# Patient Record
Sex: Female | Born: 1945 | Race: Black or African American | Hispanic: No | Smoking: Never smoker
Health system: Southern US, Community
[De-identification: ages and names within clinical notes are randomized; demographics above are authoritative.]

## PROBLEM LIST (undated history)

## (undated) DIAGNOSIS — Z8619 Personal history of other infectious and parasitic diseases: Secondary | ICD-10-CM

## (undated) DIAGNOSIS — D649 Anemia, unspecified: Secondary | ICD-10-CM

## (undated) DIAGNOSIS — N6019 Diffuse cystic mastopathy of unspecified breast: Secondary | ICD-10-CM

## (undated) DIAGNOSIS — I10 Essential (primary) hypertension: Secondary | ICD-10-CM

## (undated) DIAGNOSIS — I639 Cerebral infarction, unspecified: Secondary | ICD-10-CM

## (undated) DIAGNOSIS — B379 Candidiasis, unspecified: Secondary | ICD-10-CM

## (undated) DIAGNOSIS — A599 Trichomoniasis, unspecified: Secondary | ICD-10-CM

## (undated) HISTORY — DX: Candidiasis, unspecified: B37.9

## (undated) HISTORY — PX: OTHER SURGICAL HISTORY: SHX169

## (undated) HISTORY — DX: Trichomoniasis, unspecified: A59.9

## (undated) HISTORY — DX: Cerebral infarction, unspecified: I63.9

## (undated) HISTORY — DX: Anemia, unspecified: D64.9

## (undated) HISTORY — PX: DILATION AND CURETTAGE OF UTERUS: SHX78

## (undated) HISTORY — DX: Essential (primary) hypertension: I10

## (undated) HISTORY — DX: Personal history of other infectious and parasitic diseases: Z86.19

## (undated) HISTORY — DX: Diffuse cystic mastopathy of unspecified breast: N60.19

---

## 1998-01-15 ENCOUNTER — Encounter: Payer: Self-pay | Admitting: Emergency Medicine

## 1998-01-16 ENCOUNTER — Inpatient Hospital Stay (HOSPITAL_COMMUNITY): Admission: EM | Admit: 1998-01-16 | Discharge: 1998-01-18 | Payer: Self-pay | Admitting: Emergency Medicine

## 1998-01-16 ENCOUNTER — Encounter: Payer: Self-pay | Admitting: Emergency Medicine

## 1998-04-23 ENCOUNTER — Other Ambulatory Visit: Admission: RE | Admit: 1998-04-23 | Discharge: 1998-04-23 | Payer: Self-pay | Admitting: Obstetrics and Gynecology

## 1999-06-19 ENCOUNTER — Other Ambulatory Visit: Admission: RE | Admit: 1999-06-19 | Discharge: 1999-06-19 | Payer: Self-pay | Admitting: Obstetrics and Gynecology

## 1999-12-04 ENCOUNTER — Encounter: Admission: RE | Admit: 1999-12-04 | Discharge: 1999-12-04 | Payer: Self-pay | Admitting: Cardiology

## 1999-12-04 ENCOUNTER — Encounter: Payer: Self-pay | Admitting: Cardiology

## 1999-12-26 ENCOUNTER — Ambulatory Visit (HOSPITAL_COMMUNITY): Admission: RE | Admit: 1999-12-26 | Discharge: 1999-12-26 | Payer: Self-pay | Admitting: *Deleted

## 2001-07-04 ENCOUNTER — Encounter: Payer: Self-pay | Admitting: Cardiology

## 2001-07-04 ENCOUNTER — Encounter: Admission: RE | Admit: 2001-07-04 | Discharge: 2001-07-04 | Payer: Self-pay | Admitting: Cardiology

## 2001-08-18 ENCOUNTER — Encounter: Admission: RE | Admit: 2001-08-18 | Discharge: 2001-08-18 | Payer: Self-pay | Admitting: *Deleted

## 2001-08-18 ENCOUNTER — Encounter: Payer: Self-pay | Admitting: *Deleted

## 2001-11-08 ENCOUNTER — Other Ambulatory Visit: Admission: RE | Admit: 2001-11-08 | Discharge: 2001-11-08 | Payer: Self-pay | Admitting: Obstetrics and Gynecology

## 2002-02-28 ENCOUNTER — Encounter: Payer: Self-pay | Admitting: Orthopedic Surgery

## 2002-02-28 ENCOUNTER — Ambulatory Visit (HOSPITAL_COMMUNITY): Admission: RE | Admit: 2002-02-28 | Discharge: 2002-02-28 | Payer: Self-pay | Admitting: Orthopedic Surgery

## 2002-09-20 ENCOUNTER — Encounter: Payer: Self-pay | Admitting: Emergency Medicine

## 2002-09-20 ENCOUNTER — Observation Stay (HOSPITAL_COMMUNITY): Admission: EM | Admit: 2002-09-20 | Discharge: 2002-09-21 | Payer: Self-pay | Admitting: Emergency Medicine

## 2002-09-21 ENCOUNTER — Encounter: Payer: Self-pay | Admitting: Cardiology

## 2002-12-07 ENCOUNTER — Other Ambulatory Visit: Admission: RE | Admit: 2002-12-07 | Discharge: 2002-12-07 | Payer: Self-pay | Admitting: Radiology

## 2003-01-17 ENCOUNTER — Ambulatory Visit (HOSPITAL_COMMUNITY): Admission: RE | Admit: 2003-01-17 | Discharge: 2003-01-17 | Payer: Self-pay | Admitting: Orthopedic Surgery

## 2003-11-05 ENCOUNTER — Encounter: Admission: RE | Admit: 2003-11-05 | Discharge: 2003-11-05 | Payer: Self-pay | Admitting: Cardiology

## 2003-11-28 ENCOUNTER — Other Ambulatory Visit: Admission: RE | Admit: 2003-11-28 | Discharge: 2003-11-28 | Payer: Self-pay | Admitting: Obstetrics and Gynecology

## 2004-03-25 ENCOUNTER — Encounter: Admission: RE | Admit: 2004-03-25 | Discharge: 2004-03-25 | Payer: Self-pay | Admitting: Cardiology

## 2004-03-27 ENCOUNTER — Encounter: Admission: RE | Admit: 2004-03-27 | Discharge: 2004-03-27 | Payer: Self-pay | Admitting: Cardiology

## 2004-09-24 ENCOUNTER — Encounter: Admission: RE | Admit: 2004-09-24 | Discharge: 2004-09-24 | Payer: Self-pay | Admitting: Cardiology

## 2004-12-03 ENCOUNTER — Other Ambulatory Visit: Admission: RE | Admit: 2004-12-03 | Discharge: 2004-12-03 | Payer: Self-pay | Admitting: Obstetrics and Gynecology

## 2004-12-12 ENCOUNTER — Encounter (INDEPENDENT_AMBULATORY_CARE_PROVIDER_SITE_OTHER): Payer: Self-pay | Admitting: *Deleted

## 2004-12-12 ENCOUNTER — Ambulatory Visit (HOSPITAL_COMMUNITY): Admission: RE | Admit: 2004-12-12 | Discharge: 2004-12-12 | Payer: Self-pay | Admitting: Gastroenterology

## 2005-05-06 ENCOUNTER — Inpatient Hospital Stay (HOSPITAL_COMMUNITY): Admission: EM | Admit: 2005-05-06 | Discharge: 2005-05-08 | Payer: Self-pay | Admitting: *Deleted

## 2005-05-06 ENCOUNTER — Ambulatory Visit: Payer: Self-pay | Admitting: Cardiology

## 2005-08-05 ENCOUNTER — Emergency Department (HOSPITAL_COMMUNITY): Admission: EM | Admit: 2005-08-05 | Discharge: 2005-08-06 | Payer: Self-pay | Admitting: Emergency Medicine

## 2005-08-25 ENCOUNTER — Encounter: Admission: RE | Admit: 2005-08-25 | Discharge: 2005-08-25 | Payer: Self-pay | Admitting: Orthopedic Surgery

## 2006-02-26 ENCOUNTER — Other Ambulatory Visit: Admission: RE | Admit: 2006-02-26 | Discharge: 2006-02-26 | Payer: Self-pay | Admitting: Obstetrics and Gynecology

## 2006-09-20 ENCOUNTER — Encounter: Admission: RE | Admit: 2006-09-20 | Discharge: 2006-09-20 | Payer: Self-pay | Admitting: Cardiology

## 2007-04-12 ENCOUNTER — Other Ambulatory Visit: Admission: RE | Admit: 2007-04-12 | Discharge: 2007-04-12 | Payer: Self-pay | Admitting: Obstetrics and Gynecology

## 2008-01-18 ENCOUNTER — Encounter: Admission: RE | Admit: 2008-01-18 | Discharge: 2008-01-18 | Payer: Self-pay | Admitting: Orthopedic Surgery

## 2008-03-08 ENCOUNTER — Inpatient Hospital Stay (HOSPITAL_COMMUNITY): Admission: EM | Admit: 2008-03-08 | Discharge: 2008-03-10 | Payer: Self-pay | Admitting: Emergency Medicine

## 2008-05-17 ENCOUNTER — Encounter: Admission: RE | Admit: 2008-05-17 | Discharge: 2008-05-17 | Payer: Self-pay | Admitting: Orthopedic Surgery

## 2008-07-19 ENCOUNTER — Encounter: Admission: RE | Admit: 2008-07-19 | Discharge: 2008-10-17 | Payer: Self-pay | Admitting: Orthopedic Surgery

## 2008-12-13 ENCOUNTER — Encounter (INDEPENDENT_AMBULATORY_CARE_PROVIDER_SITE_OTHER): Payer: Self-pay | Admitting: Cardiology

## 2008-12-13 ENCOUNTER — Ambulatory Visit (HOSPITAL_COMMUNITY): Admission: RE | Admit: 2008-12-13 | Discharge: 2008-12-13 | Payer: Self-pay | Admitting: Cardiology

## 2009-03-16 ENCOUNTER — Inpatient Hospital Stay (HOSPITAL_COMMUNITY): Admission: EM | Admit: 2009-03-16 | Discharge: 2009-03-18 | Payer: Self-pay | Admitting: Emergency Medicine

## 2009-04-04 ENCOUNTER — Encounter: Admission: RE | Admit: 2009-04-04 | Discharge: 2009-05-28 | Payer: Self-pay | Admitting: Orthopedic Surgery

## 2009-04-18 ENCOUNTER — Encounter: Admission: RE | Admit: 2009-04-18 | Discharge: 2009-04-18 | Payer: Self-pay | Admitting: Orthopedic Surgery

## 2009-05-02 ENCOUNTER — Encounter: Admission: RE | Admit: 2009-05-02 | Discharge: 2009-05-02 | Payer: Self-pay | Admitting: Orthopedic Surgery

## 2010-01-09 ENCOUNTER — Encounter
Admission: RE | Admit: 2010-01-09 | Discharge: 2010-01-09 | Payer: Self-pay | Source: Home / Self Care | Attending: Neurology | Admitting: Neurology

## 2010-02-17 ENCOUNTER — Encounter: Payer: Self-pay | Admitting: Cardiology

## 2010-03-14 ENCOUNTER — Other Ambulatory Visit (HOSPITAL_COMMUNITY): Payer: Self-pay | Admitting: Neurology

## 2010-03-14 DIAGNOSIS — I729 Aneurysm of unspecified site: Secondary | ICD-10-CM

## 2010-03-20 ENCOUNTER — Ambulatory Visit (HOSPITAL_COMMUNITY)
Admission: RE | Admit: 2010-03-20 | Discharge: 2010-03-20 | Disposition: A | Discharge status: Home / Self Care | Payer: BC Managed Care – PPO | Source: Ambulatory Visit | Attending: Neurology | Admitting: Neurology

## 2010-03-20 DIAGNOSIS — I729 Aneurysm of unspecified site: Secondary | ICD-10-CM

## 2010-04-04 ENCOUNTER — Inpatient Hospital Stay (INDEPENDENT_AMBULATORY_CARE_PROVIDER_SITE_OTHER)
Admission: RE | Admit: 2010-04-04 | Discharge: 2010-04-04 | Disposition: A | Discharge status: Home / Self Care | Payer: BC Managed Care – PPO | Source: Ambulatory Visit | Attending: Family Medicine | Admitting: Family Medicine

## 2010-04-04 DIAGNOSIS — M799 Soft tissue disorder, unspecified: Secondary | ICD-10-CM

## 2010-04-07 ENCOUNTER — Other Ambulatory Visit (HOSPITAL_COMMUNITY)
Admission: RE | Admit: 2010-04-07 | Discharge: 2010-04-07 | Disposition: A | Discharge status: Home / Self Care | Payer: BC Managed Care – PPO | Source: Ambulatory Visit | Attending: Obstetrics and Gynecology | Admitting: Obstetrics and Gynecology

## 2010-04-07 ENCOUNTER — Other Ambulatory Visit: Payer: Self-pay | Admitting: Obstetrics and Gynecology

## 2010-04-07 DIAGNOSIS — Z01419 Encounter for gynecological examination (general) (routine) without abnormal findings: Secondary | ICD-10-CM | POA: Insufficient documentation

## 2010-04-15 ENCOUNTER — Other Ambulatory Visit: Payer: Self-pay | Admitting: Cardiology

## 2010-04-15 DIAGNOSIS — M545 Low back pain: Secondary | ICD-10-CM

## 2010-04-17 ENCOUNTER — Ambulatory Visit
Admission: RE | Admit: 2010-04-17 | Discharge: 2010-04-17 | Disposition: A | Discharge status: Home / Self Care | Payer: BC Managed Care – PPO | Source: Ambulatory Visit | Attending: Cardiology | Admitting: Cardiology

## 2010-04-17 DIAGNOSIS — M545 Low back pain: Secondary | ICD-10-CM

## 2010-04-17 LAB — DIFFERENTIAL
Basophils Absolute: 0 10*3/uL (ref 0.0–0.1)
Eosinophils Relative: 1 % (ref 0–5)
Lymphocytes Relative: 20 % (ref 12–46)
Lymphs Abs: 1.5 10*3/uL (ref 0.7–4.0)
Monocytes Absolute: 0.6 10*3/uL (ref 0.1–1.0)

## 2010-04-17 LAB — COMPREHENSIVE METABOLIC PANEL
AST: 27 U/L (ref 0–37)
Albumin: 3.6 g/dL (ref 3.5–5.2)
Chloride: 110 mEq/L (ref 96–112)
Creatinine, Ser: 0.69 mg/dL (ref 0.4–1.2)
GFR calc Af Amer: >60 mL/min (ref >60)
Sodium: 140 mEq/L (ref 135–145)
Total Bilirubin: 0.4 mg/dL (ref 0.3–1.2)

## 2010-04-17 LAB — URINALYSIS, ROUTINE W REFLEX MICROSCOPIC
Nitrite: NEGATIVE
Protein, ur: NEGATIVE mg/dL
Urobilinogen, UA: 0.2 mg/dL (ref 0.0–1.0)

## 2010-04-17 LAB — CBC
MCV: 95.2 fL (ref 78.0–100.0)
Platelets: 200 10*3/uL (ref 150–400)
WBC: 7.3 10*3/uL (ref 4.0–10.5)

## 2010-05-13 LAB — BASIC METABOLIC PANEL
BUN: 14 mg/dL (ref 6–23)
CO2: 26 mEq/L (ref 19–32)
Chloride: 112 mEq/L (ref 96–112)
Creatinine, Ser: 0.76 mg/dL (ref 0.4–1.2)

## 2010-05-13 LAB — PROTIME-INR
INR: 1 (ref 0.00–1.49)
Prothrombin Time: 13.6 seconds (ref 11.6–15.2)

## 2010-05-13 LAB — CBC
MCHC: 34.4 g/dL (ref 30.0–36.0)
MCV: 93.1 fL (ref 78.0–100.0)
Platelets: 216 10*3/uL (ref 150–400)
RDW: 14.3 % (ref 11.5–15.5)

## 2010-05-13 LAB — DIFFERENTIAL
Basophils Relative: 1 % (ref 0–1)
Eosinophils Absolute: 0 10*3/uL (ref 0.0–0.7)
Neutrophils Relative %: 55 % (ref 43–77)

## 2010-06-13 NOTE — Discharge Summary (Signed)
[name], [name]       ACCOUNT NO.:  [id number]   MEDICAL RECORD NO.:  [id number]          PATIENT TYPE:  INP   LOCATION:  3036                         FACILITY:  [facility]   PHYSICIAN:  Pramod P. Pearlean Brownie, MD    DATE OF BIRTH:  [birth year]   DATE OF ADMISSION:  05/06/2005  DATE OF DISCHARGE:  05/08/2005                                 DISCHARGE SUMMARY   DIAGNOSES AT TIME OF DISCHARGE:  1.  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  2.  Dyslipidemia.  3.  Hypertension.  4.  Fibroma resection of the right breast.   MEDICINES AT TIME OF DISCHARGE:  1.  Lipitor 20 mg a day.  2.  Lopressor 50 mg a day.  3.  Aggrenox one b.i.d.  4.  Tylenol two tablets 30 minutes to one hour prior to Aggrenox dose times      one week only.  5.  Vitamin E 800 units a day.  6.  B50 one a day.  7.  Iron one a day.  8.  Xanax 0.5 one a day.   STUDIES PERFORMED:  1.  CT of the brain on admission   DICTATION ENDED AT THIS POINT      Annie Main, N.P.    ______________________________  Sunny Schlein. Pearlean Brownie, MD    SB/MEDQ  D:  05/08/2005  T:  05/08/2005  Job:  [id number]   cc:   Pramod P. Pearlean Brownie, MD  Fax: [phone]   Osvaldo Shipper. Spruill, M.D.  Fax: [phone]

## 2010-06-13 NOTE — H&P (Signed)
NAME:  [name], [name]                 ACCOUNT NO.:  [id number]   MEDICAL RECORD NO.:  [id number]                   PATIENT TYPE:  OIB   LOCATION:  2899                                 FACILITY:  [facility]   PHYSICIAN:  Myrtie Neither, M.D.                 DATE OF BIRTH:  [birth year]   DATE OF ADMISSION:  01/17/2003  DATE OF DISCHARGE:  01/17/2003                                HISTORY & PHYSICAL   PREOPERATIVE DIAGNOSIS:  Spur of left fifth toe with dorsal callosity.   POSTOPERATIVE DIAGNOSIS:  Spur of left fifth toe with dorsal callosity.   PROCEDURE:  Phalangectomy, left fifth toe.   SURGEON:  Myrtie Neither, M.D.   ANESTHESIA:  General.   DESCRIPTION OF PROCEDURE:  The patient was taken to the operating room after  giving adequate preop medication and given general anesthesia.  The left  foot was prepped with Duraprep and draped in a sterile manner.  A tourniquet  was used for hemostasis.  A midline incision was made over the dorsal aspect  of the left 5th toe, going through the skin and subcutaneous tissue.  Extensor tendon was separated.  Osteophyte was identified along the proximal  phalanx.  A phalangectomy was then done.  Copious irrigation was done,  followed by wound closure with the use of 4-0 nylon.  Five milliliters of  0.25% Marcaine were injected into the area.  A compressive dressing was  applied and the patient was fit for a fracture shoe.  The patient tolerated  the procedure quite well and went to the recovery room in stable and  satisfactory condition.   The patient is being discharged home on Percocet one to two q.4 h. p.r.n.  for pain, ice packs, elevation.  Return to the office in one week.  The  patient is being discharged in stable and satisfactory condition.                                                Myrtie Neither, M.D.    AC/MEDQ  D:  01/17/2003  T:  01/18/2003  Job:  [id number]
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
## 2010-06-13 NOTE — Op Note (Signed)
[name], [name]       ACCOUNT NO.:  [id number]   MEDICAL RECORD NO.:  [id number]          PATIENT TYPE:  AMB   LOCATION:  ENDO                         FACILITY:  [facility]   PHYSICIAN:  Anselmo Rod, M.D.  DATE OF BIRTH:  Jul 03, 1945   DATE OF PROCEDURE:  12/12/2004  DATE OF DISCHARGE:                                 OPERATIVE REPORT   PROCEDURE PERFORMED:  Colonoscopy with multiple cold biopsies.   ENDOSCOPIST:  Anselmo Rod, M.D.   INSTRUMENT USED:  Olympus video colonoscope.   INDICATION FOR PROCEDURE:  A 65 year old African-American female with a  personal history of colonic polyps undergoing a surveillance colonoscopy to  rule out recurrent polyps.   PREPROCEDURE PREPARATION:  Informed consent was procured from the patient.  The patient was fasted for four hours prior to the procedure and prepped  with OsmoPrep the night or in the morning of the procedure.  The risks and  benefits of the procedure, including a 10% miss rate of cancer and polyps,  were discussed with the patient as well.   PREPROCEDURE PHYSICAL:  VITAL SIGNS:  The patient had stable vital signs.  NECK:  Supple.  CHEST:  Clear to auscultation.  S1, S2 regular.  ABDOMEN:  Soft with normal bowel sounds.   DESCRIPTION OF PROCEDURE:  The patient was placed in the left lateral  decubitus position and sedated with 75 mg of Demerol and 5 mg of Versed in  slow incremental doses.  Once the patient was adequately sedate and  maintained on low-flow oxygen and continuous cardiac monitoring, the Olympus  video colonoscope was advanced from the rectum to the cecum.  The  appendiceal orifice and the ileocecal valve were visualized and  photographed.  Four small sessile polyps were biopsied from the rectosigmoid  colon.  These were removed by cold biopsies.  Small internal and external  hemorrhoids were seen on retroflexion and anal inspection, respectively.  The rest of the exam was unremarkable.  The  proximal left colon, transverse  colon, cecum and ascending colon appeared normal.  There was some residual  stool in the colon.  Multiple washes were dyspnea on exertion.   IMPRESSION:  1.  Four small sessile polyps removed by cold biopsy from the rectosigmoid      colon.  2.  Small internal and external hemorrhoids.  3.  Otherwise unrevealing colonoscopy.   RECOMMENDATIONS:  1.  Await pathology results.  2.  Avoid all nonsteroidals including aspirin for the next two weeks.  3.  Repeat colonoscopy depending on pathology results.  4.  Outpatient follow-up as need arises in the future.      Anselmo Rod, M.D.  Electronically Signed     JNM/MEDQ  D:  12/12/2004  T:  12/13/2004  Job:  [id number]   cc:   Osvaldo Shipper. Spruill, M.D.  Fax: [phone]   Artist Pais, M.D.  Fax: [phone]

## 2010-06-13 NOTE — Consult Note (Signed)
NAMEMarland Kitchen  ROYALTY, FAKHOURI NO.:  [id number]   MEDICAL RECORD NO.:  [id number]          PATIENT TYPE:  EMS   LOCATION:  MAJO                         FACILITY:  [facility]   PHYSICIAN:  Marlan Palau, M.D.  DATE OF BIRTH:  12-18-1945   DATE OF CONSULTATION:  05/06/2005  DATE OF DISCHARGE:                                   CONSULTATION   NEUROLOGY CONSULT NOTE   HISTORY:  [name] is a 65 year old right-handed black female  born July 13, 1945 with a history of problems with hypertension.  The  patient is on a baby aspirin a day prior to this admission.  Three days  prior to this evaluation, the patient was taking a shower and then all of a  sudden got crazy thoughts in her head.  The patient was telling herself to  walk outside naked, but resisted that attempt.  The patient got over this in  several minutes.  The patient, 2 days prior to admission, felt as if she  were in a fog while at work.  Today, the patient was noted to have some  problems retaining fluid in her mouth while brushing her teeth and later on  noticed that her speech was a little bit slurred.  This has gradually  improved over time.  The patient reported no numbness or weakness in the  face, arms or legs.  No headache, visual field changes, double vision or  loss of vision.  The patient also noted while typing she felt slow.  The  patient came to the emergency room for further evaluation.  CT scan of the  head was unremarkable.   PAST MEDICAL HISTORY:  Significant for:  1.  History of new onset speech disturbance.  2.  Hypertension.  3.  Fibroma resection of the right breast.  4.  Hypercholesterolemia.   MEDICATIONS:  1.  Lopressor 50 mg daily.  2.  Xanax 0.5 mg if needed.  3.  Lipitor 10 mg daily.  4.  Aspirin 81 mg daily.  5.  Vitamin E supplementation.   ALLERGIES:  THE PATIENT HAS AN ALLERGY TO FLAGYL.   SOCIAL HISTORY:  Does not smoke or drink.  This patient is married, [place]  [place], has 2 children who are alive and well.  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   FAMILY MEDICAL HISTORY:  Notable that mother died with cancer and father  died with multiple myeloma.  The patient has no brothers and three sisters.  Sisters have borderline diabetes and hypertension.   REVIEW OF SYSTEMS:  Notable for no recent fevers or chills.  The patient  denies headache, visual field disturbance, swallowing problems or neck pain.  The patient does report some right shoulder pain.  Denies shortness of  breath and chest pain, but reports some occasional palpitations.  Denies any  abdominal pain, nausea or vomiting.  Had in urinary frequency and has been  given Detrol LA, but has not yet started this medication.  The patient has  no problems with balance or gait, numbness and  weakness on the arms or legs.   PHYSICAL EXAMINATION:  VITAL SIGNS:  Blood pressure is 150/82, heart rate is  82 and regular, respiratory rate 20 and temperature afebrile.  GENERAL:  This patient is a minimally obese black female who is alert and  cooperative that the time of this examination.  HEENT:  Head is atraumatic.  Eyes:  Pupils equal, round and reactive to  light.  Discs are flat bilaterally.  NECK:  Supple with no carotid bruits noted.  RESPIRATORY EXAMINATION:  Clear.  CARDIOVASCULAR EXAMINATION:  Regular rate and rhythm with no obvious murmurs  or rubs noted.  EXTREMITIES:  Without significant edema.  NEUROLOGIC EXAMINATION:  Cranial nerves as above.  Facial symmetry is  present.  The patient has good sensation of the face to pin prick and soft  touch bilaterally, has good strength to the facial muscles, and the muscles  to head turning and shoulder shrugging bilaterally. Speech is well  enunciated and not aphasic .  Motor testing reveals 5/5 strength in all 4  extremities.  Good symmetric motor tone is noted  throughout.  Sensory  testing is intact to pin prick, soft touch andvibration throughout.  The  patient has good finger-nose-finger and heel-to-shin, and no drift is seen.  Gait was not tested.  Deep tendon reflexes are symmetric with normal toes  downgoing bilaterally.   CT of the head is normal.   LABORATORY VALUES:  So far, none.   IMPRESSION:  1.  History of slurred speech and drooling today that is resolving.  2.  Rule out transient ischemic attack event.   This patient really has very minimal findings at this time.  We will set  patient for an MRI and MRA angiogram through the emergency room, and  continue aspirin for now.  If the above studies look unremarkable, we will  plan to discharge patient to home with follow up with [facility].  If a stroke-like event is noted, we may consider admission.      Marlan Palau, M.D.  Electronically Signed     CKW/MEDQ  D:  05/06/2005  T:  05/06/2005  Job:  [id number]   cc:   Osvaldo Shipper. Spruill, M.D.  Fax: [phone]   C. Lesia Sago, M.D.  Fax: [phone]

## 2010-06-13 NOTE — Discharge Summary (Signed)
[name], [name]       ACCOUNT NO.:  [id number]   MEDICAL RECORD NO.:  [id number]          PATIENT TYPE:  INP   LOCATION:  3036                         FACILITY:  [facility]   PHYSICIAN:  Pramod P. Pearlean Brownie, MD    DATE OF BIRTH:  1945-11-06   DATE OF ADMISSION:  05/06/2005  DATE OF DISCHARGE:  05/08/2005                                 DISCHARGE SUMMARY   DIAGNOSES AT DISCHARGE:  1.  Left corticol infarct secondary to small-vessel disease.  2.  Incidental bilateral cerebral aneurysms.  3.  Dyslipidemia.  4.  Hypertension.  5.  Fibroma resection of the right breast.   MEDICINES AT THE TIME OF DISCHARGE:  1.  Lipitor 20 mg [name] day.  2.  Lopressor 50 mg [name] day.  3.  Aggrenox one b.i.d.  4.  Tylenol two tablets 30 minutes to 1 hour prior to Aggrenox dose.  5.  Vitamin E 800 units [name] day.  6.  B50 one [name] day.  7.  Iron one [name] day.  8.  Xanax 0.5 mg [name] day.   STUDIES PERFORMED:  1.  CT of the brain on admission shows mild cerebral atrophy, no acute      findings.  2.  MRI of the brain shows acute/subacute infarct left posterior putamen.      Slightly greater than expected number of periventricular and subcortical      T2 hyperintensities.  These are nonspecific.  3.  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  4.  Chest x-ray shows no active cardiopulmonary findings.  5.  MRI of the neck shows no significant carotid or vertebral artery      stenosis, origin of the left common carotid artery and brachiocephalic      artery.  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  6.  EKG shows normal sinus rhythm with left atrial enlargement.  7.  [name] 2-D echocardiogram shows ejection fraction of 55%, no LV wall motion  abnormalities, mild focal basal septal hypertrophy, atrial septum is not      well visualized.  8.  Carotid Doppler performed; results pending.  9.  Transcranial Doppler performed; results pending.   LABORATORY STUDIES:  CBC with RDW 14.6, otherwise normal.  Differential  normal.  Coagulation studies normal.  Chemistry normal.  Liver function  tests normal.  Hemoglobin A1c 6.1.  Homocysteine pending.  Cholesterol 208,  triglycerides 55, HDL 54, and LDL 143.  Urine drug screen positive for  benzodiazepines.  Urinalysis with 7-10 white blood cells, 0-2 red blood  cells, and moderate leukocyte esterase.   HISTORY OF PRESENT ILLNESS:  [name] is [name]  right-handed black female with [name] history of problems of hypertension.  She  has been on [name] baby aspirin [name] day prior to this admission.  Three days  prior  to evaluation the patient was taking [name] shower and then all of [name] sudden got  crazy thoughts in her head.  The patient was telling herself to walk outside  naked but resisted the attempt.  The patient got over this in several  minutes.  Two days prior to admission she felt as if she were in [name] fog while  at work.  The day of admission she was noted to have some problems retaining  fluid in her mouth while brushing her teeth and later on noticed her speech  was [name] little bit slurred.  This has gradually improved.  The patient reports  no numbness or weakness in the face, arms, or legs.  She has no headache,  visual field changes, double vision, or loss of vision.  The patient was  also noted while typing to be slow.  She presented to the emergency room  for further evaluation.  CT of the head was unremarkable.  She was not [name]  candidate for tPA secondary to time of onset.  The patient was admitted to  the hospital for further stroke evaluation.   HOSPITAL COURSE:  MRI did reveal an acute infarct in the left putamen.  The  stroke was found to be secondary to small-vessel  disease and she had  extensive periventricular white matter disease.  The patient was changed  from aspirin 81 mg [name] day to Aggrenox b.i.d. for secondary stroke prevention.  Her vascular risk factors of hypertension are well controlled but her lipids  were slightly elevated with LDL in the 140s.  Her Lipitor 10 mg prior to  admission was increased to 20 mg.  The patient also has vascular risk factor  of race.  Workup was completed during hospitalization.  Her neurologic  symptoms had totally resolved and she was safe to return home.   CONDITION AT DISCHARGE:  The patient alert and oriented x3.  Speech clear.  She has no aphasia or dysarthria.  Her extraocular movements are intact.  Her face is symmetric.  She has no neurologic focal deficits.  She moves all  extremities x4.   DISCHARGE PLAN:  1.  Discharge home with family.  2.  Aggrenox for secondary stroke prevention.  3.  Increase Lipitor.  Will need followup in 4-6 weeks.  4.  Follow up with primary care physician in 2 weeks for management of risk      factors.  5.  Follow up with Dr. Delia Heady in 2 months to address incidental      aneurysm findings.      Annie Main, N.P.    ______________________________  [name]. Pearlean Brownie, MD    SB/MEDQ  D:  05/08/2005  T:  05/08/2005  Job:  [id number]   cc:   Osvaldo Shipper. Spruill, M.D.  Fax: [phone]

## 2010-06-13 NOTE — H&P (Signed)
NAME:  [name], [name]                 ACCOUNT NO.:  [id number]   MEDICAL RECORD NO.:  [id number]                   PATIENT TYPE:  OIB   LOCATION:  2899                                 FACILITY:  [facility]   PHYSICIAN:  Myrtie Neither, M.D.                 DATE OF BIRTH:  [birth year]   DATE OF ADMISSION:  01/17/2003  DATE OF DISCHARGE:  01/17/2003                                HISTORY & PHYSICAL   CHIEF COMPLAINT:  Painful left little toe.   HISTORY OF PRESENT ILLNESS:  This is a 65 year old who had been treated in  the office for osteophyte and tendinitis with dorsal callosity of the left  5th toe over the past seven months.  The patient was treated with anti-  inflammatories and soaks but the patient's problem were severe pain,  difficulty wearing closed-in shoes and progressively got worse.   PAST MEDICAL HISTORY:  Past medical history is:  1. Colonoscopy.  2. Cardiac catheterization.  3. Fibroadenoma of right breast, removed.  4. High blood pressure.   MEDICATIONS:  1. Lopressor 50 mg daily.  2. Lipitor 10 mg daily.  3. Coated aspirin 81 mg.  4. Multivitamins and vitamin E.  5. Celebrex.   ALLERGIES:  FLAGYL causes facial swelling.   SOCIAL HISTORY:  The patient is married.  No history of use of alcohol or  cigarettes.   REVIEW OF SYSTEMS:  Review of systems basically as in history of present  illness.  No cardiac or respiratory, no urinary or bowel symptoms.   PHYSICAL EXAMINATION:  VITAL SIGNS:  Temperature 98.5, pulse 61,  respirations 18, blood pressure 144/90.  Height 65 inches.  Weight 185.  HEENT:  Normocephalic.  Eyes:  Conjunctivae and sclerae clear.  NECK:  Neck supple.  CHEST:  Clear.  CARDIAC:  S1 and S2, regular.  EXTREMITIES:  Left foot with large dorsal callosity -- markedly tender -- of  the 5th toe with palpable osteophyte.   IMPRESSION:  1. Spur, left fifth toe, dorsal callosity.  2. Tenosynovitis.   PLAN:  Plan is that of phalangectomy,  left 5th toe.                                                Myrtie Neither, M.D.    AC/MEDQ  D:  01/17/2003  T:  01/18/2003  Job:  [id number]

## 2010-11-12 ENCOUNTER — Other Ambulatory Visit: Payer: Self-pay | Admitting: Neurology

## 2010-11-12 DIAGNOSIS — I671 Cerebral aneurysm, nonruptured: Secondary | ICD-10-CM

## 2010-11-17 ENCOUNTER — Other Ambulatory Visit: Payer: Self-pay | Admitting: Neurology

## 2010-11-24 ENCOUNTER — Ambulatory Visit
Admission: RE | Admit: 2010-11-24 | Discharge: 2010-11-24 | Disposition: A | Discharge status: Home / Self Care | Payer: BC Managed Care – PPO | Source: Ambulatory Visit | Attending: Neurology | Admitting: Neurology

## 2010-11-24 DIAGNOSIS — I671 Cerebral aneurysm, nonruptured: Secondary | ICD-10-CM

## 2010-11-24 MED ORDER — IOHEXOL 350 MG/ML SOLN
100.0000 mL | Freq: Once | INTRAVENOUS | Status: AC | PRN
  Administered 2010-11-24: 100 mL via INTRAVENOUS

## 2011-04-30 ENCOUNTER — Ambulatory Visit
Admission: RE | Admit: 2011-04-30 | Discharge: 2011-04-30 | Disposition: A | Discharge status: Home / Self Care | Payer: BC Managed Care – PPO | Source: Ambulatory Visit | Attending: Orthopedic Surgery | Admitting: Orthopedic Surgery

## 2011-04-30 ENCOUNTER — Other Ambulatory Visit: Payer: Self-pay | Admitting: Orthopedic Surgery

## 2011-04-30 DIAGNOSIS — M47817 Spondylosis without myelopathy or radiculopathy, lumbosacral region: Secondary | ICD-10-CM | POA: Diagnosis not present

## 2011-04-30 DIAGNOSIS — M5137 Other intervertebral disc degeneration, lumbosacral region: Secondary | ICD-10-CM | POA: Diagnosis not present

## 2011-04-30 DIAGNOSIS — M5136 Other intervertebral disc degeneration, lumbar region: Secondary | ICD-10-CM

## 2011-07-23 ENCOUNTER — Other Ambulatory Visit: Payer: Self-pay | Admitting: Cardiology

## 2011-07-23 DIAGNOSIS — R102 Pelvic and perineal pain: Secondary | ICD-10-CM

## 2011-07-23 DIAGNOSIS — I722 Aneurysm of renal artery: Secondary | ICD-10-CM

## 2011-07-27 ENCOUNTER — Ambulatory Visit (HOSPITAL_COMMUNITY)
Admission: RE | Admit: 2011-07-27 | Discharge: 2011-07-27 | Disposition: A | Discharge status: Home / Self Care | Payer: Medicare Other | Source: Ambulatory Visit | Attending: Cardiology | Admitting: Cardiology

## 2011-07-27 DIAGNOSIS — M7989 Other specified soft tissue disorders: Secondary | ICD-10-CM

## 2011-07-28 ENCOUNTER — Ambulatory Visit
Admission: RE | Admit: 2011-07-28 | Discharge: 2011-07-28 | Disposition: A | Discharge status: Home / Self Care | Payer: Medicare Other | Source: Ambulatory Visit | Attending: Cardiology | Admitting: Cardiology

## 2011-07-28 DIAGNOSIS — I722 Aneurysm of renal artery: Secondary | ICD-10-CM

## 2011-07-28 DIAGNOSIS — N949 Unspecified condition associated with female genital organs and menstrual cycle: Secondary | ICD-10-CM | POA: Diagnosis not present

## 2011-07-28 DIAGNOSIS — R102 Pelvic and perineal pain: Secondary | ICD-10-CM

## 2011-07-28 DIAGNOSIS — I70229 Atherosclerosis of native arteries of extremities with rest pain, unspecified extremity: Secondary | ICD-10-CM | POA: Diagnosis not present

## 2011-07-28 MED ORDER — IOHEXOL 350 MG/ML SOLN
80.0000 mL | Freq: Once | INTRAVENOUS | Status: AC | PRN
  Administered 2011-07-28: 80 mL via INTRAVENOUS

## 2011-08-14 ENCOUNTER — Other Ambulatory Visit: Payer: Self-pay

## 2011-08-14 DIAGNOSIS — M7989 Other specified soft tissue disorders: Secondary | ICD-10-CM

## 2011-08-25 ENCOUNTER — Ambulatory Visit (INDEPENDENT_AMBULATORY_CARE_PROVIDER_SITE_OTHER): Payer: Medicare Other

## 2011-08-25 ENCOUNTER — Encounter: Payer: Self-pay | Admitting: Obstetrics and Gynecology

## 2011-08-25 ENCOUNTER — Ambulatory Visit (INDEPENDENT_AMBULATORY_CARE_PROVIDER_SITE_OTHER): Payer: Medicare Other | Admitting: Obstetrics and Gynecology

## 2011-08-25 VITALS — BP 130/70 | HR 64 | Temp 98.8°F | Resp 16 | Ht 66.5 in | Wt 180.0 lb

## 2011-08-25 DIAGNOSIS — R350 Frequency of micturition: Secondary | ICD-10-CM | POA: Diagnosis not present

## 2011-08-25 DIAGNOSIS — N949 Unspecified condition associated with female genital organs and menstrual cycle: Secondary | ICD-10-CM

## 2011-08-25 DIAGNOSIS — N952 Postmenopausal atrophic vaginitis: Secondary | ICD-10-CM | POA: Diagnosis not present

## 2011-08-25 DIAGNOSIS — N6019 Diffuse cystic mastopathy of unspecified breast: Secondary | ICD-10-CM | POA: Insufficient documentation

## 2011-08-25 DIAGNOSIS — R102 Pelvic and perineal pain: Secondary | ICD-10-CM | POA: Insufficient documentation

## 2011-08-25 DIAGNOSIS — A599 Trichomoniasis, unspecified: Secondary | ICD-10-CM | POA: Insufficient documentation

## 2011-08-25 DIAGNOSIS — D219 Benign neoplasm of connective and other soft tissue, unspecified: Secondary | ICD-10-CM

## 2011-08-25 DIAGNOSIS — Z719 Counseling, unspecified: Secondary | ICD-10-CM

## 2011-08-25 DIAGNOSIS — D259 Leiomyoma of uterus, unspecified: Secondary | ICD-10-CM

## 2011-08-25 DIAGNOSIS — I639 Cerebral infarction, unspecified: Secondary | ICD-10-CM | POA: Insufficient documentation

## 2011-08-25 DIAGNOSIS — IMO0001 Reserved for inherently not codable concepts without codable children: Secondary | ICD-10-CM

## 2011-08-25 DIAGNOSIS — B379 Candidiasis, unspecified: Secondary | ICD-10-CM | POA: Insufficient documentation

## 2011-08-25 DIAGNOSIS — Z8619 Personal history of other infectious and parasitic diseases: Secondary | ICD-10-CM | POA: Insufficient documentation

## 2011-08-25 LAB — POCT URINALYSIS DIPSTICK
Bilirubin, UA: NEGATIVE
Glucose, UA: NEGATIVE
Ketones, UA: NEGATIVE
Leukocytes, UA: NEGATIVE
Nitrite, UA: NEGATIVE

## 2011-08-25 LAB — POCT OSOM BVBLUE TEST: Bacterial Vaginosis: NEGATIVE

## 2011-08-25 LAB — POCT OSOM TRICHOMONAS RAPID TEST: Trichomonas vaginalis: NEGATIVE

## 2011-08-25 LAB — POCT WET PREP (WET MOUNT): pH: 5

## 2011-08-25 MED ORDER — ESTROGENS, CONJUGATED 0.625 MG/GM VA CREA: TOPICAL_CREAM | VAGINAL | Status: DC

## 2011-08-25 NOTE — Progress Notes (Signed)
NEW PATIENT CONSULTATION Last Pap: 2012 WNL: Yes.  No hx of abnl pap Regular Periods:no Contraception: Post Menopausal  Since age 66 Monthly Breast exam:yes Tetanus<72yrs:no Nl.Bladder Function:yes frequency/urgency Daily BMs:yes Healthy Diet:yes Calcium:no Mammogram:yes Date of Mammogram: 12/2010 WNL Exercise:yes Have often Exercise: 3 times weekly Seatbelt: yes Abuse at home: no Stressful work:no Sigmoid-colonoscopy: 12/2010 WNL Bone Density: Yes unsure of last PCP: [place] Change in PMH: None Change in [facility]: None  Reason for Consult:Pt here for pelvic discomfort x several months and edema of lower extremities x 3-4 months.  Also c/o urinary urgency, frequency and occassional urgency incontinence Referring Physician: Dr. Posey Rea [name] is an 66 y.o. female.   Pertinent Gynecological History: Menses: post-menopausal Bleeding: none Contraception: post menopausal status DES exposure: unknown Blood transfusions: none Sexually transmitted diseases: trichomonas Previous GYN Procedures: excision of fibroadenoma  OB History: G2, P2   Menstrual History:  No LMP recorded. Patient is postmenopausal.    Past Medical History  Diagnosis Date  . Yeast infection   . Trichomonas   . Cystic breast   . Stroke     TIA  . Hypertension   . Anemia   . History of chicken pox   . History of measles   . History of mumps     Past Surgical History  Procedure Date  . Dilation and curettage of uterus   . Excision of fibroadenoma     Family History  Problem Relation Age of Onset  . Asthma Sister     Social History:  reports that she has never smoked. She has never used smokeless tobacco. She reports that she does not drink alcohol or use illicit drugs.  Allergies:  Allergies  Allergen Reactions  . Flagyl (Metronidazole)    Subjective: Patient is a 66 y.o. gravida 2 para 2, female.  Presents for evaluation of pelvic pain. Onset of symptoms was abrupt  starting 6 months ago with unchanged course since that time. The pain occurs intermittently. It is located in the suprapubic and lasts a few minutes. She describes the pain as dull. Symptoms improve with acetaminophen. In the past, she has undergone medical management, including treatment for yeast infection.  She has had trichomonas. She has recently noticed a vaginal discharge..   Patient Active Problem List   Diagnosis Date Noted  . Pelvic pain in female 08/25/2011  . Yeast infection   . Trichomonas   . Cystic breast   . Stroke   . History of chicken pox   . History of measles   . History of mumps    Past Medical History  Diagnosis Date  . Yeast infection   . Trichomonas   . Cystic breast   . Stroke     TIA  . Hypertension   . Anemia   . History of chicken pox   . History of measles   . History of mumps     Past Surgical History  Procedure Date  . Dilation and curettage of uterus   . Excision of fibroadenoma      (Not in a hospital admission) Allergies  Allergen Reactions  . Flagyl (Metronidazole)     History  Substance Use Topics  . Smoking status: Never Smoker   . Smokeless tobacco: Never Used  . Alcohol Use: No    Family History  Problem Relation Age of Onset  . Asthma Sister     Review of Systems Urinary frequency and urgency. No nocturia   Objective: BP 130/70  Pulse 64  Temp 98.8 F (37.1 C) (Oral)  Resp 16  Ht 5' 6.5" (1.689 m)  Wt 180 lb (81.647 kg)  BMI 28.62 kg/m2 Thyroid: Nl  chest clear, no wheezing, rales, normal symmetric air entry, Heart exam - S1, S2 normal, no murmur, no gallop, rate regular  Abdomen:  without guarding, without rebound, no masses Pelvic: pelvic exam is limited due to obesity,  EGBUS within normal limits,   atrophic vaginal changes noted, vaginal discharge described as clear, scant and thin,    normal cervix without lesions, polyps or tenderness,   uterus anteverted,   adnexa normal in size without mass or  tenderness,   rectovaginal exam confirms the above findings,  pap smear schedule reviewed with patient,  wet mount exam shows negative for pathogens, normal epithelial cells,     Data Review: OSOM trich, OSOM bv, wet prep, u/a, MRI ANGIO  Assessment/Plan: 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

## 2011-08-25 NOTE — Patient Instructions (Signed)
Fibroids You have been diagnosed as having a fibroid. Fibroids are smooth muscle lumps (tumors) which can occur any place in a woman's body. They are usually in the womb (uterus). The most common problem (symptom) of fibroids is bleeding. Over time this may cause low red blood cells (anemia). Other symptoms include feelings of pressure and pain in the pelvis. The diagnosis (learning what is wrong) of fibroids is made by physical exam. Sometimes tests such as an ultrasound are used. This is helpful when fibroids are felt around the ovaries and to look for tumors. TREATMENT   Most fibroids do not need surgical or medical treatment. Sometimes a tissue sample (biopsy) of the lining of the uterus is done to rule out cancer. If there is no cancer and only a small amount of bleeding, the problem can be watched.   Hormonal treatment can improve the problem.   When surgery is needed, it can consist of removing the fibroid. Vaginal birth may not be possible after the removal of fibroids. This depends on where they are and the extent of surgery. When pregnancy occurs with fibroids it is usually normal.   Your caregiver can help decide which treatments are best for you.  HOME CARE INSTRUCTIONS   Do not use aspirin as this may increase bleeding problems.   If your periods (menses) are heavy, record the number of pads or tampons used per month. Bring this information to your caregiver. This can help them determine the best treatment for you.  SEEK IMMEDIATE MEDICAL CARE IF:  You have pelvic pain or cramps not controlled with medications, or experience a sudden increase in pain.   You have an increase of pelvic bleeding between and during menses.   You feel lightheaded or have fainting spells.   You develop worsening belly (abdominal) pain.  Document Released: 01/10/2000 Document Revised: 01/01/2011 Document Reviewed: 09/01/2007 [employer] Patient Information 2012 [employer].    

## 2011-08-27 LAB — URINE CULTURE: Organism ID, Bacteria: NO GROWTH

## 2011-08-28 ENCOUNTER — Other Ambulatory Visit: Payer: Self-pay

## 2011-08-28 ENCOUNTER — Ambulatory Visit: Payer: Medicare Other

## 2011-08-28 ENCOUNTER — Other Ambulatory Visit: Payer: Self-pay | Admitting: Obstetrics and Gynecology

## 2011-08-28 DIAGNOSIS — R102 Pelvic and perineal pain: Secondary | ICD-10-CM

## 2011-09-11 DIAGNOSIS — M722 Plantar fascial fibromatosis: Secondary | ICD-10-CM | POA: Diagnosis not present

## 2011-09-11 DIAGNOSIS — M62838 Other muscle spasm: Secondary | ICD-10-CM | POA: Diagnosis not present

## 2011-09-21 DIAGNOSIS — M722 Plantar fascial fibromatosis: Secondary | ICD-10-CM | POA: Diagnosis not present

## 2011-09-23 DIAGNOSIS — M722 Plantar fascial fibromatosis: Secondary | ICD-10-CM | POA: Diagnosis not present

## 2011-09-29 DIAGNOSIS — M722 Plantar fascial fibromatosis: Secondary | ICD-10-CM | POA: Diagnosis not present

## 2011-10-01 DIAGNOSIS — M722 Plantar fascial fibromatosis: Secondary | ICD-10-CM | POA: Diagnosis not present

## 2011-10-05 DIAGNOSIS — E7800 Pure hypercholesterolemia, unspecified: Secondary | ICD-10-CM | POA: Diagnosis not present

## 2011-10-05 DIAGNOSIS — I10 Essential (primary) hypertension: Secondary | ICD-10-CM | POA: Diagnosis not present

## 2011-10-05 DIAGNOSIS — G458 Other transient cerebral ischemic attacks and related syndromes: Secondary | ICD-10-CM | POA: Diagnosis not present

## 2011-10-05 DIAGNOSIS — M722 Plantar fascial fibromatosis: Secondary | ICD-10-CM | POA: Diagnosis not present

## 2011-10-06 DIAGNOSIS — M722 Plantar fascial fibromatosis: Secondary | ICD-10-CM | POA: Diagnosis not present

## 2011-10-23 ENCOUNTER — Encounter: Payer: Self-pay | Admitting: Vascular Surgery

## 2011-10-26 ENCOUNTER — Ambulatory Visit (INDEPENDENT_AMBULATORY_CARE_PROVIDER_SITE_OTHER): Payer: Medicare Other | Admitting: Vascular Surgery

## 2011-10-26 ENCOUNTER — Encounter: Payer: Self-pay | Admitting: Vascular Surgery

## 2011-10-26 ENCOUNTER — Encounter (INDEPENDENT_AMBULATORY_CARE_PROVIDER_SITE_OTHER): Payer: Medicare Other | Admitting: *Deleted

## 2011-10-26 VITALS — BP 151/82 | HR 66 | Resp 20 | Ht 66.0 in | Wt 186.0 lb

## 2011-10-26 DIAGNOSIS — M79604 Pain in right leg: Secondary | ICD-10-CM | POA: Insufficient documentation

## 2011-10-26 DIAGNOSIS — M79609 Pain in unspecified limb: Secondary | ICD-10-CM | POA: Diagnosis not present

## 2011-10-26 DIAGNOSIS — M79605 Pain in left leg: Secondary | ICD-10-CM | POA: Insufficient documentation

## 2011-10-26 DIAGNOSIS — R609 Edema, unspecified: Secondary | ICD-10-CM

## 2011-10-26 DIAGNOSIS — R600 Localized edema: Secondary | ICD-10-CM | POA: Insufficient documentation

## 2011-10-26 DIAGNOSIS — M7989 Other specified soft tissue disorders: Secondary | ICD-10-CM

## 2011-10-26 NOTE — Progress Notes (Signed)
Subjective:     Patient ID: [name], female   DOB: [birth year], 66 y.o.   MRN: [id number]  HPI this 66 year old female was referred by Dr. Shana Chute for evaluation of lower extremity edema. She has no history of DVT, thrombophlebitis, stasis ulcers, or bleeding. She has noted some edema between the knee and ankle in the pretibial region bilaterally. This is not occur every day. She has some swelling in the ankles. Legs are about equal in severity. She does have some aching discomfort as the day progresses. She is not wear elastic compression stockings on a regular basis.  Past Medical History  Diagnosis Date  . Yeast infection   . Trichomonas   . Cystic breast   . Stroke     TIA  . Hypertension   . Anemia   . History of chicken pox   . History of measles   . History of mumps   . Trichomonas   . Cystic breast     History  Substance Use Topics  . Smoking status: Never Smoker   . Smokeless tobacco: Never Used  . Alcohol Use: No    Family History  Problem Relation Age of Onset  . Deep vein thrombosis Sister   . Diabetes Sister   . Hyperlipidemia Sister   . Cancer Mother   . Cancer Father   . Diabetes Daughter   . Hyperlipidemia Daughter     Allergies  Allergen Reactions  . Flagyl (Metronidazole)     Current outpatient prescriptions:acetaminophen (TYLENOL) 500 MG tablet, Take 500 mg by mouth every 6 (six) hours as needed., Disp: , Rfl: ;  atorvastatin (LIPITOR) 20 MG tablet, Take 20 mg by mouth daily., Disp: , Rfl: ;  Biotin 1000 MCG tablet, Take 1,000 mcg by mouth 2 (two) times daily. , Disp: , Rfl: ;  conjugated estrogens (PREMARIN) vaginal cream, Pt to insert 1gram per vagina 2x/weekly qhs, Disp: 42.5 g, Rfl: 3 dipyridamole-aspirin (AGGRENOX) 200-25 MG per 12 hr capsule, Take 1 capsule by mouth 2 (two) times daily., Disp: , Rfl: ;  fish oil-omega-3 fatty acids 1000 MG capsule, Take 2 g by mouth daily., Disp: , Rfl: ;  metoprolol (LOPRESSOR) 50 MG tablet, Take 50  mg by mouth daily. , Disp: , Rfl: ;  valsartan-hydrochlorothiazide (DIOVAN-HCT) 80-12.5 MG per tablet, Take 1 tablet by mouth daily., Disp: , Rfl:   BP 151/82  Pulse 66  Resp 20  Ht 5\' 6"  (1.676 m)  Wt 186 lb (84.369 kg)  BMI 30.02 kg/m2  Body mass index is 30.02 kg/(m^2).           Review of Systems denies chest pain, dyspnea on exertion, PND, orthopnea, hemoptysis, does have a remote history of short TIA in 2007 with mild aphasia which resolved and has not recurred. He takes Aggrenox for this-all other systems negative and complete review of systems    Objective:   Physical Exam blood pressure 151/82 heart rate 66 respirations 20 Gen.-alert and oriented x3 in no apparent distress HEENT normal for age Lungs no rhonchi or wheezing Cardiovascular regular rhythm no murmurs carotid pulses 3+ palpable no bruits audible Abdomen soft nontender no palpable masses Musculoskeletal free of  major deformities Skin clear -no rashes Neurologic normal Lower extremities 3+ femoral and dorsalis pedis pulses palpable bilaterally with no edema no evidence of varicosities, hyperpigmentation, ulceration or or other evidence of venous insufficiency  Today I ordered bilateral venous duplex exam which I reviewed and interpreted. Both legs have some mild reflux  in the common femoral vein level. There is a short area of reflux in both great saphenous veins in the mid thigh but the majority of both great saphenous veins appears normal. There is no DVT.      Assessment:     Bilateral edema-etiology unknown-very minimal findings on venous duplex exam    Plan:     #1 short leg elastic compression stockings 20-30 mm gradient #2 elevate legs at night #3 diuretics as indicated #4 no need for further arterial or venous evaluation

## 2011-12-02 DIAGNOSIS — I671 Cerebral aneurysm, nonruptured: Secondary | ICD-10-CM | POA: Diagnosis not present

## 2011-12-02 DIAGNOSIS — I63239 Cerebral infarction due to unspecified occlusion or stenosis of unspecified carotid arteries: Secondary | ICD-10-CM | POA: Diagnosis not present

## 2011-12-03 ENCOUNTER — Other Ambulatory Visit: Payer: Self-pay | Admitting: Neurology

## 2011-12-03 DIAGNOSIS — I671 Cerebral aneurysm, nonruptured: Secondary | ICD-10-CM

## 2011-12-04 ENCOUNTER — Other Ambulatory Visit: Payer: Self-pay | Admitting: Neurology

## 2011-12-04 DIAGNOSIS — I671 Cerebral aneurysm, nonruptured: Secondary | ICD-10-CM | POA: Diagnosis not present

## 2011-12-04 LAB — BUN: BUN: 15 mg/dL (ref 6–23)

## 2011-12-08 ENCOUNTER — Ambulatory Visit
Admission: RE | Admit: 2011-12-08 | Discharge: 2011-12-08 | Disposition: A | Discharge status: Home / Self Care | Payer: Medicare Other | Source: Ambulatory Visit | Attending: Neurology | Admitting: Neurology

## 2011-12-08 DIAGNOSIS — I671 Cerebral aneurysm, nonruptured: Secondary | ICD-10-CM

## 2011-12-08 DIAGNOSIS — I728 Aneurysm of other specified arteries: Secondary | ICD-10-CM | POA: Diagnosis not present

## 2011-12-08 DIAGNOSIS — I720 Aneurysm of carotid artery: Secondary | ICD-10-CM | POA: Diagnosis not present

## 2011-12-08 MED ORDER — IOHEXOL 350 MG/ML SOLN
100.0000 mL | Freq: Once | INTRAVENOUS | Status: AC | PRN
  Administered 2011-12-08: 100 mL via INTRAVENOUS

## 2012-01-15 DIAGNOSIS — Z1231 Encounter for screening mammogram for malignant neoplasm of breast: Secondary | ICD-10-CM | POA: Diagnosis not present

## 2012-03-02 DIAGNOSIS — H40059 Ocular hypertension, unspecified eye: Secondary | ICD-10-CM | POA: Diagnosis not present

## 2012-04-28 DIAGNOSIS — E7800 Pure hypercholesterolemia, unspecified: Secondary | ICD-10-CM | POA: Diagnosis not present

## 2012-04-28 DIAGNOSIS — I10 Essential (primary) hypertension: Secondary | ICD-10-CM | POA: Diagnosis not present

## 2012-04-28 DIAGNOSIS — R252 Cramp and spasm: Secondary | ICD-10-CM | POA: Diagnosis not present

## 2012-04-28 DIAGNOSIS — Z8673 Personal history of transient ischemic attack (TIA), and cerebral infarction without residual deficits: Secondary | ICD-10-CM | POA: Diagnosis not present

## 2012-04-28 DIAGNOSIS — I671 Cerebral aneurysm, nonruptured: Secondary | ICD-10-CM | POA: Diagnosis not present

## 2012-06-02 DIAGNOSIS — E7800 Pure hypercholesterolemia, unspecified: Secondary | ICD-10-CM | POA: Diagnosis not present

## 2012-06-02 DIAGNOSIS — I671 Cerebral aneurysm, nonruptured: Secondary | ICD-10-CM | POA: Diagnosis not present

## 2012-06-02 DIAGNOSIS — I10 Essential (primary) hypertension: Secondary | ICD-10-CM | POA: Diagnosis not present

## 2012-06-02 DIAGNOSIS — Z8673 Personal history of transient ischemic attack (TIA), and cerebral infarction without residual deficits: Secondary | ICD-10-CM | POA: Diagnosis not present

## 2012-06-21 DIAGNOSIS — M25539 Pain in unspecified wrist: Secondary | ICD-10-CM | POA: Diagnosis not present

## 2012-06-23 DIAGNOSIS — I10 Essential (primary) hypertension: Secondary | ICD-10-CM | POA: Diagnosis not present

## 2012-06-23 DIAGNOSIS — I671 Cerebral aneurysm, nonruptured: Secondary | ICD-10-CM | POA: Diagnosis not present

## 2012-06-23 DIAGNOSIS — Z0000 Encounter for general adult medical examination without abnormal findings: Secondary | ICD-10-CM | POA: Diagnosis not present

## 2012-06-23 DIAGNOSIS — E7800 Pure hypercholesterolemia, unspecified: Secondary | ICD-10-CM | POA: Diagnosis not present

## 2012-06-23 DIAGNOSIS — Z006 Encounter for examination for normal comparison and control in clinical research program: Secondary | ICD-10-CM | POA: Diagnosis not present

## 2012-07-08 DIAGNOSIS — M25539 Pain in unspecified wrist: Secondary | ICD-10-CM | POA: Diagnosis not present

## 2012-07-08 DIAGNOSIS — G5600 Carpal tunnel syndrome, unspecified upper limb: Secondary | ICD-10-CM | POA: Diagnosis not present

## 2012-08-01 DIAGNOSIS — M79609 Pain in unspecified limb: Secondary | ICD-10-CM | POA: Diagnosis not present

## 2012-08-05 DIAGNOSIS — G5600 Carpal tunnel syndrome, unspecified upper limb: Secondary | ICD-10-CM | POA: Diagnosis not present

## 2012-08-23 ENCOUNTER — Telehealth: Payer: Self-pay | Admitting: Neurology

## 2012-08-23 MED ORDER — ASPIRIN-DIPYRIDAMOLE ER 25-200 MG PO CP12: 1.0000 | ORAL_CAPSULE | Freq: Two times a day (BID) | ORAL | Status: DC

## 2012-08-23 NOTE — Telephone Encounter (Signed)
Rx has been sent  

## 2012-09-28 ENCOUNTER — Telehealth: Payer: Self-pay | Admitting: Neurology

## 2012-09-28 DIAGNOSIS — I671 Cerebral aneurysm, nonruptured: Secondary | ICD-10-CM

## 2012-09-28 NOTE — Telephone Encounter (Signed)
Patient called because she wants to schedule her annual angiogram and then follow that with an OV with Dr. Pearlean Brownie. Patient would like to know if Dr. Pearlean Brownie wants to continue this plan of annual angiograms, since it has been very effective. Please advise and make referral. I will get back to patient with details.   Thank you.

## 2012-09-29 ENCOUNTER — Other Ambulatory Visit: Payer: Self-pay

## 2012-09-29 NOTE — Telephone Encounter (Signed)
Ok to do MRA brain for aneurysm f/u and see me after that

## 2012-09-29 NOTE — Telephone Encounter (Signed)
I see that you had ordered MRAs up until 2011. In 2012 and 2013 you ordered CTs of the brain. I am confirming that you want to continue with the CT.

## 2012-09-30 ENCOUNTER — Other Ambulatory Visit: Payer: Self-pay | Admitting: *Deleted

## 2012-09-30 DIAGNOSIS — I671 Cerebral aneurysm, nonruptured: Secondary | ICD-10-CM

## 2012-09-30 NOTE — Telephone Encounter (Signed)
Agree will change MRA to Ct angio brain

## 2012-09-30 NOTE — Telephone Encounter (Signed)
I called pt and relayed that order placed for CTA.  Will be authorized and then she will receive call about scheduling.   She last had BUN/Creat 11/2011.  She will need another one and order placed and pt wants faxed to [facility].

## 2012-10-04 ENCOUNTER — Other Ambulatory Visit: Payer: Self-pay | Admitting: Neurology

## 2012-10-04 DIAGNOSIS — I671 Cerebral aneurysm, nonruptured: Secondary | ICD-10-CM | POA: Diagnosis not present

## 2012-10-04 LAB — CREATININE, SERUM: Creat: 0.69 mg/dL (ref 0.50–1.10)

## 2012-10-05 ENCOUNTER — Telehealth: Payer: Self-pay | Admitting: Neurology

## 2012-10-05 DIAGNOSIS — H40009 Preglaucoma, unspecified, unspecified eye: Secondary | ICD-10-CM | POA: Diagnosis not present

## 2012-10-05 NOTE — Telephone Encounter (Signed)
Patient calling wanting lab results. Please advise

## 2012-10-06 NOTE — Progress Notes (Signed)
Quick Note:  I called pt and gave her the results of bun/creat. Normal. She verbalized understanding. ______

## 2012-10-07 NOTE — Telephone Encounter (Signed)
I called pt and gave her the results of her labs (bun/creat).  She is scheduled to have her CTA soon.

## 2012-10-11 ENCOUNTER — Other Ambulatory Visit: Payer: Medicare Other

## 2012-10-13 ENCOUNTER — Ambulatory Visit
Admission: RE | Admit: 2012-10-13 | Discharge: 2012-10-13 | Disposition: A | Discharge status: Home / Self Care | Payer: Medicare Other | Source: Ambulatory Visit | Attending: Neurology | Admitting: Neurology

## 2012-10-13 DIAGNOSIS — I6350 Cerebral infarction due to unspecified occlusion or stenosis of unspecified cerebral artery: Secondary | ICD-10-CM | POA: Diagnosis not present

## 2012-10-13 DIAGNOSIS — I671 Cerebral aneurysm, nonruptured: Secondary | ICD-10-CM

## 2012-10-13 MED ORDER — IOHEXOL 350 MG/ML SOLN
75.0000 mL | Freq: Once | INTRAVENOUS | Status: AC | PRN
  Administered 2012-10-13: 75 mL via INTRAVENOUS

## 2012-10-14 NOTE — Progress Notes (Signed)
Quick Note:  I called pt with results of CTA. Gave her the results as per below. 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 Pt informed and verbalized understanding. She did feel that she needed to come in for an appt. Next yr she will need appt. She will call if has headaches or sx concerning. ______

## 2012-11-29 DIAGNOSIS — E7800 Pure hypercholesterolemia, unspecified: Secondary | ICD-10-CM | POA: Diagnosis not present

## 2012-11-29 DIAGNOSIS — Z7901 Long term (current) use of anticoagulants: Secondary | ICD-10-CM | POA: Diagnosis not present

## 2012-11-30 DIAGNOSIS — Z7901 Long term (current) use of anticoagulants: Secondary | ICD-10-CM | POA: Diagnosis not present

## 2012-11-30 DIAGNOSIS — E7800 Pure hypercholesterolemia, unspecified: Secondary | ICD-10-CM | POA: Diagnosis not present

## 2012-12-05 DIAGNOSIS — R011 Cardiac murmur, unspecified: Secondary | ICD-10-CM | POA: Diagnosis not present

## 2012-12-05 DIAGNOSIS — E7800 Pure hypercholesterolemia, unspecified: Secondary | ICD-10-CM | POA: Diagnosis not present

## 2012-12-05 DIAGNOSIS — I10 Essential (primary) hypertension: Secondary | ICD-10-CM | POA: Diagnosis not present

## 2012-12-05 DIAGNOSIS — M25519 Pain in unspecified shoulder: Secondary | ICD-10-CM | POA: Diagnosis not present

## 2012-12-13 DIAGNOSIS — M79609 Pain in unspecified limb: Secondary | ICD-10-CM | POA: Diagnosis not present

## 2012-12-19 ENCOUNTER — Other Ambulatory Visit: Payer: Self-pay | Admitting: Neurology

## 2012-12-20 ENCOUNTER — Telehealth: Payer: Self-pay | Admitting: Neurology

## 2012-12-20 NOTE — Telephone Encounter (Signed)
Patient has been scheduled/ confirmed with patient, refill request for dipyridamole-aspirin(aggrenox)

## 2012-12-20 NOTE — Telephone Encounter (Signed)
Rx has already been sent

## 2013-01-16 DIAGNOSIS — Z1231 Encounter for screening mammogram for malignant neoplasm of breast: Secondary | ICD-10-CM | POA: Diagnosis not present

## 2013-01-22 ENCOUNTER — Encounter (HOSPITAL_COMMUNITY): Payer: Self-pay | Admitting: Emergency Medicine

## 2013-01-22 ENCOUNTER — Emergency Department (HOSPITAL_COMMUNITY)
Admission: EM | Admit: 2013-01-22 | Discharge: 2013-01-22 | Disposition: A | Discharge status: Home / Self Care | Payer: Medicare Other | Attending: Emergency Medicine | Admitting: Emergency Medicine

## 2013-01-22 ENCOUNTER — Emergency Department (HOSPITAL_COMMUNITY): Payer: Medicare Other

## 2013-01-22 DIAGNOSIS — M545 Low back pain, unspecified: Secondary | ICD-10-CM | POA: Diagnosis not present

## 2013-01-22 DIAGNOSIS — R52 Pain, unspecified: Secondary | ICD-10-CM | POA: Insufficient documentation

## 2013-01-22 DIAGNOSIS — Z8673 Personal history of transient ischemic attack (TIA), and cerebral infarction without residual deficits: Secondary | ICD-10-CM | POA: Insufficient documentation

## 2013-01-22 DIAGNOSIS — Z8619 Personal history of other infectious and parasitic diseases: Secondary | ICD-10-CM | POA: Insufficient documentation

## 2013-01-22 DIAGNOSIS — I10 Essential (primary) hypertension: Secondary | ICD-10-CM | POA: Diagnosis not present

## 2013-01-22 DIAGNOSIS — Z79899 Other long term (current) drug therapy: Secondary | ICD-10-CM | POA: Insufficient documentation

## 2013-01-22 DIAGNOSIS — K920 Hematemesis: Secondary | ICD-10-CM | POA: Insufficient documentation

## 2013-01-22 DIAGNOSIS — Z862 Personal history of diseases of the blood and blood-forming organs and certain disorders involving the immune mechanism: Secondary | ICD-10-CM | POA: Insufficient documentation

## 2013-01-22 DIAGNOSIS — Z87448 Personal history of other diseases of urinary system: Secondary | ICD-10-CM | POA: Insufficient documentation

## 2013-01-22 DIAGNOSIS — K921 Melena: Secondary | ICD-10-CM

## 2013-01-22 DIAGNOSIS — K625 Hemorrhage of anus and rectum: Secondary | ICD-10-CM | POA: Insufficient documentation

## 2013-01-22 LAB — CBC WITH DIFFERENTIAL/PLATELET
Basophils Absolute: 0 10*3/uL (ref 0.0–0.1)
Eosinophils Absolute: 0 10*3/uL (ref 0.0–0.7)
Eosinophils Relative: 1 % (ref 0–5)
HCT: 36.4 % (ref 36.0–46.0)
Lymphocytes Relative: 25 % (ref 12–46)
Lymphs Abs: 1.5 10*3/uL (ref 0.7–4.0)
MCH: 30.3 pg (ref 26.0–34.0)
MCHC: 34.1 g/dL (ref 30.0–36.0)
MCV: 89 fL (ref 78.0–100.0)
Monocytes Absolute: 0.4 10*3/uL (ref 0.1–1.0)
Platelets: 268 10*3/uL (ref 150–400)
RDW: 15.1 % (ref 11.5–15.5)

## 2013-01-22 LAB — POCT I-STAT, CHEM 8
BUN: 16 mg/dL (ref 6–23)
Calcium, Ion: 1.36 mmol/L — ABNORMAL HIGH (ref 1.13–1.30)
Creatinine, Ser: 0.7 mg/dL (ref 0.50–1.10)
Glucose, Bld: 92 mg/dL (ref 70–99)
Hemoglobin: 13.3 g/dL (ref 12.0–15.0)
Sodium: 142 mEq/L (ref 135–145)
TCO2: 25 mmol/L (ref 0–100)

## 2013-01-22 MED ORDER — ONDANSETRON 8 MG PO TBDP
8.0000 mg | ORAL_TABLET | Freq: Once | ORAL | Status: AC
  Administered 2013-01-22: 8 mg via ORAL
  Filled 2013-01-22: qty 1

## 2013-01-22 MED ORDER — METOCLOPRAMIDE HCL 5 MG/ML IJ SOLN: 10.0000 mg | Freq: Once | INTRAMUSCULAR | Status: DC

## 2013-01-22 MED ORDER — HYDROMORPHONE HCL PF 2 MG/ML IJ SOLN
2.0000 mg | Freq: Once | INTRAMUSCULAR | Status: AC
  Administered 2013-01-22: 2 mg via INTRAMUSCULAR
  Filled 2013-01-22: qty 1

## 2013-01-22 NOTE — ED Notes (Signed)
Per pt, states lower back pain started yesterday, no injury-notice "pinkish" blood on TP and in stool this am-thinks back pain is related to blood in stool

## 2013-01-22 NOTE — ED Notes (Signed)
Patient is ready for d/c, however continues to have nausea and vomiting with movement.  Refused anti-emetic.  Will take to d/c in wheelchair at 1845.

## 2013-01-22 NOTE — ED Provider Notes (Signed)
CSN: [id number]     Arrival date & time 01/22/13  1319 History   First MD Initiated Contact with Patient 01/22/13 1517     Chief Complaint  Patient presents with  . Rectal Bleeding  . Back Pain   (Consider location/radiation/quality/duration/timing/severity/associated sxs/prior Treatment) HPI 67 year old female with 2 days gradual onset constant localized nonradiating low back pain without associated symptoms such as weakness numbness incoordination radiation down her legs change in bowel or bladder function fevers or history of IV drug abuse or spinal surgery, she is no chest pain cough shortness breath abdominal pain except for some mild diffuse abdominal cramps earlier today for several minutes which are now resolved she is no nausea vomiting diarrhea no grossly bloody stools or melena however when she wiped her anus today she noticed a few spots of pink blood on the tissue paper in a few spots of pink blood on top of her formed brown stool, her low back pain is diffuse across her lumbar area moderately severe worse with position changes and nonexertional with no treatment prior to arrival. Past Medical History  Diagnosis Date  . Yeast infection   . Trichomonas   . Cystic breast   . Stroke     TIA  . Hypertension   . Anemia   . History of chicken pox   . History of measles   . History of mumps   . Trichomonas   . Cystic breast    Past Surgical History  Procedure Laterality Date  . Dilation and curettage of uterus    . Excision of fibroadenoma     Family History  Problem Relation Age of Onset  . Deep vein thrombosis Sister   . Diabetes Sister   . Hyperlipidemia Sister   . Cancer Mother   . Cancer Father   . Diabetes Daughter   . Hyperlipidemia Daughter    History  Substance Use Topics  . Smoking status: Never Smoker   . Smokeless tobacco: Never Used  . Alcohol Use: No   OB History   Grav Para Term Preterm Abortions TAB SAB Ect Mult Living   2 2 2       2       Review of Systems 10 Systems reviewed and are negative for acute change except as noted in the HPI. Allergies  Flagyl  Home Medications   Current Outpatient Rx  Name  Route  Sig  Dispense  Refill  . acetaminophen (TYLENOL) 500 MG tablet   Oral   Take 500 mg by mouth every 6 (six) hours as needed for mild pain or moderate pain.          Marland Kitchen atorvastatin (LIPITOR) 20 MG tablet   Oral   Take 20 mg by mouth daily.         Marland Kitchen dipyridamole-aspirin (AGGRENOX) 200-25 MG per 12 hr capsule   Oral   Take 1 capsule by mouth 2 (two) times daily.         . metoprolol (LOPRESSOR) 50 MG tablet   Oral   Take 25 mg by mouth 2 (two) times daily.          . Multiple Vitamins-Minerals (MULTIVITAMIN WITH MINERALS) tablet   Oral   Take 1 tablet by mouth daily.         . valsartan-hydrochlorothiazide (DIOVAN-HCT) 80-12.5 MG per tablet   Oral   Take 1 tablet by mouth daily.          BP 151/70  Pulse 63  Temp(Src) 98.7 F (37.1 C) (Oral)  Resp 12  SpO2 94% Physical Exam  Nursing note and vitals reviewed. Constitutional:  Awake, alert, nontoxic appearance with baseline speech.  HENT:  Head: Atraumatic.  Eyes: Pupils are equal, round, and reactive to light. Right eye exhibits no discharge. Left eye exhibits no discharge.  Neck: Neck supple.  Cardiovascular: Normal rate and regular rhythm.   No murmur heard. Pulmonary/Chest: Effort normal and breath sounds normal. No respiratory distress. She has no wheezes. She has no rales. She exhibits no tenderness.  Abdominal: Soft. Bowel sounds are normal. She exhibits no mass. There is no tenderness. There is no rebound.  Musculoskeletal: She exhibits no edema.       Thoracic back: She exhibits no tenderness.       Lumbar back: She exhibits no tenderness.  Bilateral lower extremities non tender without new rashes or color change, baseline ROM with intact DP pulses, CR<2 secs all digits bilaterally, sensation baseline light touch  bilaterally for pt, DTR's symmetric and intact bilaterally KJ / AJ, motor symmetric bilateral 5 / 5 hip flexion, quadriceps, hamstrings, EHL, foot dorsiflexion, foot plantarflexion, gait somewhat antalgic but without apparent new ataxia. Mild diffuse lumbar and paralumbar tenderness.  Neurological: She is alert.  Mental status baseline for patient.  Upper extremity motor strength and sensation intact and symmetric bilaterally.  Skin: No rash noted.  Psychiatric: She has a normal mood and affect.    ED Course  Procedures (including critical care time) Chaperone present for anoscope exam reveals likely source of rectal bleeding with small mucosal tear 1:00 position. Patient developed nausea after Dilaudid injection and does not want prescription for narcotics for pain. Patient / Family / Caregiver informed of clinical course, understand medical decision-making process, and agree with plan. Labs Review Labs Reviewed  POCT I-STAT, CHEM 8 - Abnormal; Notable for the following:    Calcium, Ion 1.36 (*)    All other components within normal limits  CBC WITH DIFFERENTIAL   Imaging Review Dg Lumbar Spine Complete  01/22/2013   CLINICAL DATA:  Left lower back pain for 2 days.  EXAM: LUMBAR SPINE - COMPLETE 4+ VIEW  COMPARISON:  CT 07/28/2011  FINDINGS: There is no evidence of lumbar spine fracture. Alignment is normal. Intervertebral disc spaces are maintained. Calcified fibroid reidentified. Vascular calcification reidentified over the right upper quadrant.  IMPRESSION: Negative.   Electronically Signed   By: Christiana Pellant M.D.   On: 01/22/2013 16:02    EKG Interpretation   None       MDM   1. Acute lumbar back pain   2. Hematochezia    I doubt any other EMC precluding discharge at this time including, but not necessarily limited to the following:SBI, cauda equina.    Hurman Horn, MD 01/23/13 2114

## 2013-01-22 NOTE — ED Notes (Signed)
Patient refused anti-emetic.  Is with sisters, denies pain, somnolent but easily aroused. D/C in w/c.

## 2013-02-02 DIAGNOSIS — K648 Other hemorrhoids: Secondary | ICD-10-CM | POA: Diagnosis not present

## 2013-02-02 DIAGNOSIS — R6889 Other general symptoms and signs: Secondary | ICD-10-CM | POA: Diagnosis not present

## 2013-02-02 DIAGNOSIS — K625 Hemorrhage of anus and rectum: Secondary | ICD-10-CM | POA: Diagnosis not present

## 2013-02-02 DIAGNOSIS — K602 Anal fissure, unspecified: Secondary | ICD-10-CM | POA: Diagnosis not present

## 2013-02-08 ENCOUNTER — Encounter: Payer: Self-pay | Admitting: Neurology

## 2013-02-08 ENCOUNTER — Ambulatory Visit (INDEPENDENT_AMBULATORY_CARE_PROVIDER_SITE_OTHER): Payer: Medicare Other | Admitting: Neurology

## 2013-02-08 VITALS — BP 140/74 | HR 76 | Ht 66.0 in | Wt 178.0 lb

## 2013-02-08 DIAGNOSIS — I6789 Other cerebrovascular disease: Secondary | ICD-10-CM | POA: Diagnosis not present

## 2013-02-08 NOTE — Patient Instructions (Signed)
Continue Aggrenox for stroke prevention.Strcit. control of hypertension with blood pressure goal below 130/90. I had a long discussion with the patient about her asymptomatic intracranial aneurysms, discussed the risk of rupture, treatment options and answered questions. Continue yearly CT angiogram for aneurysm followup. I offered referral to Dr. Kathyrn Sheriff interventional neurosurgeon but the patient wants to think about this and let me know. Return for followup in one year.

## 2013-02-09 NOTE — Progress Notes (Signed)
[facility] [address] [place]. [place] [address] [phone]       OFFICE FOLLOW-UP NOTE  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 She states her blood pressure is well controlled and it is 140/74 in our office today. She remains very anxious about her asymptomatic aneurysms and again had lots of questions particularly as when I mentioned that most recent Ct angio 10/13/12  Showed slight increase 2-3 mm right paraopthalmic artery aneurysm and stable apperance of lobulated  8x5 mm left ICA aneurysm and 2 mm distal left ICA aneurysm. She had been referred to Dr Estanislado Pandy 2 years ago and had decided against intervention.She is tolerating aggrenox well and states lipids are well controlled when last checked by primary MD ROS:   14 system review of systems is positive for leg swelling, palpititions,rectal bleeding, anemia only and all other systems negative  PMH:  Past Medical History  Diagnosis Date  . Yeast infection   . Trichomonas   . Cystic breast   . Stroke     TIA  . Hypertension   . Anemia   . History of chicken pox   . History of measles   . History of mumps   . Trichomonas   . Cystic breast     Social History:  History   Social History  . Marital Status: Married    Spouse Name: N/A    Number of Children: 2  . Years of Education: masters   Occupational History  . retired    Social History Main Topics  . Smoking status: Never Smoker   . Smokeless tobacco: Never Used  . Alcohol Use: No  . Drug Use: No  . Sexual Activity: Not Currently   Birth Control/ Protection: Post-menopausal, Abstinence   Other Topics Concern  . Not on file   Social History Narrative  . No narrative on file    Medications:   Current Outpatient Prescriptions on File Prior to Visit  Medication Sig Dispense Refill  . acetaminophen (TYLENOL) 500 MG tablet Take 500 mg by mouth every 6 (six) hours as needed for mild pain or moderate pain.       Marland Kitchen atorvastatin (LIPITOR) 20 MG tablet Take 20 mg by mouth daily.      Marland Kitchen dipyridamole-aspirin (AGGRENOX) 200-25 MG per 12 hr capsule Take 1 capsule by mouth 2 (two) times daily.      . metoprolol (LOPRESSOR) 50 MG tablet Take 25 mg by mouth 2 (two) times daily.       . Multiple Vitamins-Minerals (MULTIVITAMIN WITH MINERALS) tablet Take 1 tablet by mouth daily.      . valsartan-hydrochlorothiazide (DIOVAN-HCT) 80-12.5 MG per tablet Take 1 tablet by mouth daily.       No current facility-administered medications on file prior to visit.    Allergies:   Allergies  Allergen Reactions  . Flagyl [Metronidazole] Itching and Swelling    Physical Exam General: well developed, well nourished african american lady, seated, in no evident distress Head: head normocephalic and atraumatic. Orohparynx benign Neck: supple with no carotid or supraclavicular bruits Cardiovascular: regular rate and rhythm, no murmurs  Musculoskeletal: no deformity Skin:  no rash/petichiae Vascular:  Normal pulses all extremities Filed Vitals:   02/08/13 1508  BP: 140/74  Pulse: 76   Neurologic Exam Mental Status: Awake and fully alert. Oriented to place and time. Recent and remote memory intact. Attention span, concentration and fund of knowledge appropriate. Mood and affect appropriate.  Cranial Nerves: Fundoscopic exam reveals sharp disc margins. Pupils equal, briskly reactive to light. Extraocular movements full without nystagmus. Visual fields full to confrontation. Hearing intact. Facial sensation intact. Face, tongue, palate moves  normally and symmetrically.  Motor: Normal bulk and tone. Normal strength in all tested extremity muscles. Sensory.: intact to touch and pinprick and vibratory sensation.  Coordination: Rapid alternating movements normal in all extremities. Finger-to-nose and heel-to-shin performed accurately bilaterally. Gait and Station: Arises from chair without difficulty. Stance is normal. Gait demonstrates normal stride length and balance . Able to heel, toe and tandem walk without difficulty.  Reflexes: 1+ and symmetric. 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 control of hypertension with blood pressure goal below 130/90. I had a long discussion with the patient about her asymptomatic intracranial aneurysms, discussed the risk of rupture, treatment options and answered questions. Continue yearly CT angiogram for aneurysm followup. I offered referral to Dr. Kathyrn Sheriff interventional neurosurgeon but the patient wants to think about this and let me know. Return for followup in one year.

## 2013-04-04 DIAGNOSIS — H2510 Age-related nuclear cataract, unspecified eye: Secondary | ICD-10-CM | POA: Diagnosis not present

## 2013-04-04 DIAGNOSIS — H5200 Hypermetropia, unspecified eye: Secondary | ICD-10-CM | POA: Diagnosis not present

## 2013-04-04 DIAGNOSIS — H04129 Dry eye syndrome of unspecified lacrimal gland: Secondary | ICD-10-CM | POA: Diagnosis not present

## 2013-04-04 DIAGNOSIS — H40019 Open angle with borderline findings, low risk, unspecified eye: Secondary | ICD-10-CM | POA: Diagnosis not present

## 2013-04-17 ENCOUNTER — Telehealth: Payer: Self-pay | Admitting: Neurology

## 2013-04-17 MED ORDER — ASPIRIN-DIPYRIDAMOLE ER 25-200 MG PO CP12: 1.0000 | ORAL_CAPSULE | Freq: Two times a day (BID) | ORAL | Status: DC

## 2013-04-17 NOTE — Telephone Encounter (Signed)
Rx has been sent  

## 2013-04-17 NOTE — Telephone Encounter (Signed)
Pt called and stated she needed a refill on her Aggrenox.  Please call her with any questions.  Thank you.

## 2013-06-13 DIAGNOSIS — I10 Essential (primary) hypertension: Secondary | ICD-10-CM | POA: Diagnosis not present

## 2013-06-13 DIAGNOSIS — E7800 Pure hypercholesterolemia, unspecified: Secondary | ICD-10-CM | POA: Diagnosis not present

## 2013-06-24 DIAGNOSIS — E782 Mixed hyperlipidemia: Secondary | ICD-10-CM | POA: Diagnosis not present

## 2013-06-24 DIAGNOSIS — I10 Essential (primary) hypertension: Secondary | ICD-10-CM | POA: Diagnosis not present

## 2013-06-24 DIAGNOSIS — R002 Palpitations: Secondary | ICD-10-CM | POA: Diagnosis not present

## 2013-07-26 ENCOUNTER — Encounter: Payer: Self-pay | Admitting: Neurology

## 2013-08-08 DIAGNOSIS — Z0000 Encounter for general adult medical examination without abnormal findings: Secondary | ICD-10-CM | POA: Diagnosis not present

## 2013-08-23 DIAGNOSIS — E7800 Pure hypercholesterolemia, unspecified: Secondary | ICD-10-CM | POA: Diagnosis not present

## 2013-08-23 DIAGNOSIS — I10 Essential (primary) hypertension: Secondary | ICD-10-CM | POA: Diagnosis not present

## 2013-08-23 DIAGNOSIS — I6529 Occlusion and stenosis of unspecified carotid artery: Secondary | ICD-10-CM | POA: Diagnosis not present

## 2013-08-23 DIAGNOSIS — R7309 Other abnormal glucose: Secondary | ICD-10-CM | POA: Diagnosis not present

## 2013-09-26 ENCOUNTER — Other Ambulatory Visit: Payer: Self-pay | Admitting: Cardiology

## 2013-09-26 DIAGNOSIS — R609 Edema, unspecified: Secondary | ICD-10-CM | POA: Diagnosis not present

## 2013-09-27 ENCOUNTER — Other Ambulatory Visit: Payer: Self-pay | Admitting: Cardiology

## 2013-09-27 DIAGNOSIS — R600 Localized edema: Secondary | ICD-10-CM

## 2013-09-27 DIAGNOSIS — I722 Aneurysm of renal artery: Secondary | ICD-10-CM

## 2013-09-29 DIAGNOSIS — I722 Aneurysm of renal artery: Secondary | ICD-10-CM | POA: Diagnosis not present

## 2013-10-04 ENCOUNTER — Other Ambulatory Visit: Payer: Medicare Other

## 2013-10-16 ENCOUNTER — Encounter (INDEPENDENT_AMBULATORY_CARE_PROVIDER_SITE_OTHER): Payer: Self-pay

## 2013-10-16 ENCOUNTER — Ambulatory Visit
Admission: RE | Admit: 2013-10-16 | Discharge: 2013-10-16 | Disposition: A | Discharge status: Home / Self Care | Payer: Medicare Other | Source: Ambulatory Visit | Attending: Cardiology | Admitting: Cardiology

## 2013-10-16 DIAGNOSIS — I722 Aneurysm of renal artery: Secondary | ICD-10-CM | POA: Diagnosis not present

## 2013-10-16 DIAGNOSIS — I774 Celiac artery compression syndrome: Secondary | ICD-10-CM | POA: Diagnosis not present

## 2013-10-16 DIAGNOSIS — R600 Localized edema: Secondary | ICD-10-CM

## 2013-10-16 DIAGNOSIS — M7989 Other specified soft tissue disorders: Secondary | ICD-10-CM | POA: Diagnosis not present

## 2013-10-16 DIAGNOSIS — N281 Cyst of kidney, acquired: Secondary | ICD-10-CM | POA: Diagnosis not present

## 2013-10-16 MED ORDER — IOHEXOL 350 MG/ML SOLN
80.0000 mL | Freq: Once | INTRAVENOUS | Status: AC | PRN
  Administered 2013-10-16: 80 mL via INTRAVENOUS

## 2013-11-10 ENCOUNTER — Telehealth: Payer: Self-pay | Admitting: Neurology

## 2013-11-10 NOTE — Telephone Encounter (Signed)
Patient calling to check if Dr. Leonie Man wants her to have an MRI this year, please return call and advise.

## 2013-11-10 NOTE — Telephone Encounter (Signed)
Pt calling to see if it is time for her yearly CT Angiogram. Pt's last CT Angiogram was 10/13/12 and per OV note on 02/08/13, Dr. Leonie Man wanted pt to have a yearly CT Angiogram. Orders need to be put in. Please advise thanks

## 2013-11-10 NOTE — Telephone Encounter (Signed)
Yes . Kindly order CT angiogram brain for aneurysm f/u

## 2013-11-14 NOTE — Telephone Encounter (Signed)
Please advise. Thanks.  

## 2013-11-27 ENCOUNTER — Encounter: Payer: Self-pay | Admitting: Neurology

## 2013-11-30 DIAGNOSIS — L02439 Carbuncle of limb, unspecified: Secondary | ICD-10-CM | POA: Diagnosis not present

## 2013-12-01 ENCOUNTER — Other Ambulatory Visit: Payer: Self-pay | Admitting: *Deleted

## 2013-12-01 NOTE — Telephone Encounter (Signed)
Tried ordering CT angiogram for patient and was unsuccessful, Dr Leonie Man you will have to order this, I will call patient and inform her of delay.

## 2013-12-01 NOTE — Telephone Encounter (Signed)
Patient calling to check whether this has been scheduled yet, please return call and advise.

## 2013-12-03 ENCOUNTER — Other Ambulatory Visit: Payer: Self-pay | Admitting: Neurology

## 2013-12-03 DIAGNOSIS — I671 Cerebral aneurysm, nonruptured: Secondary | ICD-10-CM

## 2013-12-03 NOTE — Telephone Encounter (Signed)
I ordered it

## 2013-12-04 ENCOUNTER — Telehealth: Payer: Self-pay | Admitting: Neurology

## 2013-12-04 ENCOUNTER — Other Ambulatory Visit: Payer: Self-pay | Admitting: Neurology

## 2013-12-04 DIAGNOSIS — I671 Cerebral aneurysm, nonruptured: Secondary | ICD-10-CM

## 2013-12-04 NOTE — Telephone Encounter (Signed)
Ok done

## 2013-12-04 NOTE — Telephone Encounter (Signed)
Spoke with patient and informed her that CT was ordered and that it has to go through insurance approval, and then she will be contacted to schedule appointment, patient verbalized understanding and had no further questions or concerns.

## 2013-12-04 NOTE — Telephone Encounter (Signed)
Angelita Ingles with [facility] is calling regarding patient. Angelita Ingles needs an order for BUN and Creatinine put in Epic.

## 2013-12-18 ENCOUNTER — Other Ambulatory Visit: Payer: Self-pay | Admitting: Neurology

## 2013-12-18 ENCOUNTER — Other Ambulatory Visit: Payer: Self-pay | Admitting: *Deleted

## 2013-12-18 DIAGNOSIS — I639 Cerebral infarction, unspecified: Secondary | ICD-10-CM

## 2013-12-18 LAB — BUN: BUN: 15 mg/dL (ref 6–23)

## 2013-12-18 LAB — CREATININE, SERUM: Creat: 0.67 mg/dL (ref 0.50–1.10)

## 2013-12-19 ENCOUNTER — Ambulatory Visit
Admission: RE | Admit: 2013-12-19 | Discharge: 2013-12-19 | Disposition: A | Discharge status: Home / Self Care | Payer: Medicare Other | Source: Ambulatory Visit | Attending: Neurology | Admitting: Neurology

## 2013-12-19 DIAGNOSIS — I671 Cerebral aneurysm, nonruptured: Secondary | ICD-10-CM | POA: Diagnosis not present

## 2013-12-19 MED ORDER — IOHEXOL 350 MG/ML SOLN
65.0000 mL | Freq: Once | INTRAVENOUS | Status: AC | PRN
  Administered 2013-12-19: 65 mL via INTRAVENOUS

## 2013-12-25 DIAGNOSIS — H43811 Vitreous degeneration, right eye: Secondary | ICD-10-CM | POA: Diagnosis not present

## 2014-01-22 DIAGNOSIS — Z803 Family history of malignant neoplasm of breast: Secondary | ICD-10-CM | POA: Diagnosis not present

## 2014-01-22 DIAGNOSIS — Z1231 Encounter for screening mammogram for malignant neoplasm of breast: Secondary | ICD-10-CM | POA: Diagnosis not present

## 2014-01-29 DIAGNOSIS — H2513 Age-related nuclear cataract, bilateral: Secondary | ICD-10-CM | POA: Diagnosis not present

## 2014-01-29 DIAGNOSIS — H4011X4 Primary open-angle glaucoma, indeterminate stage: Secondary | ICD-10-CM | POA: Diagnosis not present

## 2014-01-29 DIAGNOSIS — H43813 Vitreous degeneration, bilateral: Secondary | ICD-10-CM | POA: Diagnosis not present

## 2014-01-29 DIAGNOSIS — H25013 Cortical age-related cataract, bilateral: Secondary | ICD-10-CM | POA: Diagnosis not present

## 2014-02-08 ENCOUNTER — Ambulatory Visit: Payer: Medicare Other | Admitting: Neurology

## 2014-02-19 ENCOUNTER — Other Ambulatory Visit: Payer: Self-pay

## 2014-02-19 MED ORDER — ASPIRIN-DIPYRIDAMOLE ER 25-200 MG PO CP12: 1.0000 | ORAL_CAPSULE | Freq: Two times a day (BID) | ORAL | Status: DC

## 2014-03-13 DIAGNOSIS — H4011X4 Primary open-angle glaucoma, indeterminate stage: Secondary | ICD-10-CM | POA: Diagnosis not present

## 2014-03-21 DIAGNOSIS — R102 Pelvic and perineal pain: Secondary | ICD-10-CM | POA: Diagnosis not present

## 2014-03-21 DIAGNOSIS — D259 Leiomyoma of uterus, unspecified: Secondary | ICD-10-CM | POA: Diagnosis not present

## 2014-03-21 DIAGNOSIS — N952 Postmenopausal atrophic vaginitis: Secondary | ICD-10-CM | POA: Diagnosis not present

## 2014-03-21 DIAGNOSIS — Z124 Encounter for screening for malignant neoplasm of cervix: Secondary | ICD-10-CM | POA: Diagnosis not present

## 2014-03-28 ENCOUNTER — Ambulatory Visit: Payer: Medicare Other | Admitting: Neurology

## 2014-03-28 ENCOUNTER — Encounter: Payer: Self-pay | Admitting: Neurology

## 2014-03-28 ENCOUNTER — Ambulatory Visit (INDEPENDENT_AMBULATORY_CARE_PROVIDER_SITE_OTHER): Payer: Medicare Other | Admitting: Neurology

## 2014-03-28 VITALS — BP 111/67 | HR 61 | Ht 66.0 in | Wt 177.8 lb

## 2014-03-28 DIAGNOSIS — I671 Cerebral aneurysm, nonruptured: Secondary | ICD-10-CM

## 2014-03-28 MED ORDER — ASPIRIN-DIPYRIDAMOLE ER 25-200 MG PO CP12: 1.0000 | ORAL_CAPSULE | Freq: Two times a day (BID) | ORAL | Status: DC

## 2014-03-28 NOTE — Progress Notes (Signed)
[facility] [address] [place]. [place] [address] [phone]       OFFICE FOLLOW-UP NOTE  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 She states her blood pressure is well controlled and it is 140/74 in our office today. She remains very anxious about her asymptomatic aneurysms and again had lots of questions particularly as when I mentioned that most recent Ct angio 10/13/12  Showed slight increase 2-3 mm right paraopthalmic artery aneurysm and stable apperance of lobulated  8x5 mm left ICA aneurysm and 2 mm distal left ICA aneurysm. She had been referred to Dr [name] 2 years ago and had decided against intervention.She is tolerating aggrenox well and states lipids are well controlled when last checked by primary MD Update 03/28/2014 : She returns for follow-up after last visit a year ago. She continues to do well without recurrent stroke or TIA symptoms since her previous stroke in April 2007. She also denies headaches. She had follow-up CT angiogram done in November 2015 which I personally reviewed and showed stable appearance of the 3 x 3 mm) ophthalmic and 8 x 5 x 5 mm left cavernous carotid aneurysms. She has seen Dr. Estanislado [name] a few years ago for aneurysm treatment and prefers conservative follow-up for now. She has no new symptoms. Her blood pressure is well controlled and it is 111/67 today. Her last lipid profile was checked in May 2015 and was fine. She is tolerating statin without side effects. She was requesting a refill for Aggrenox for a year. ROS:   14 system  review of systems is positive for  palpitations, aching muscles only and all other systems negative  PMH:  Past Medical History  Diagnosis Date  . Yeast infection   . Trichomonas   . Cystic breast   . Stroke     TIA  . Hypertension   . Anemia   . History of chicken pox   . History of measles   . History of mumps   . Trichomonas   . Cystic breast     Social History:  History   Social History  . Marital Status: Married    Spouse Name: N/A  . Number of Children: 2  . Years of Education: masters   Occupational History  . retired    Social History Main Topics  . Smoking status: Never Smoker   . Smokeless tobacco: Never Used  . Alcohol Use: No  . Drug Use: No  . Sexual Activity: Not Currently    Birth Control/ Protection: Post-menopausal, Abstinence   Other Topics Concern  . Not on file   Social History Narrative    Medications:   Current Outpatient Prescriptions on File Prior to Visit  Medication Sig Dispense Refill  . acetaminophen (TYLENOL) 500 MG tablet Take 500 mg by mouth every 6 (six) hours as needed for mild pain or moderate pain.     Marland Kitchen atorvastatin (LIPITOR) 20 MG tablet Take 20 mg by mouth daily.    . metoprolol (LOPRESSOR) 50 MG tablet Take 25 mg by mouth 2 (two) times daily.     . Multiple Vitamins-Minerals (MULTIVITAMIN WITH  MINERALS) tablet Take 1 tablet by mouth daily.    . valsartan-hydrochlorothiazide (DIOVAN-HCT) 80-12.5 MG per tablet Take 1 tablet by mouth daily.     No current facility-administered medications on file prior to visit.    Allergies:   Allergies  Allergen Reactions  . Flagyl [Metronidazole] Itching and Swelling    Physical Exam General: well developed, well nourished african american lady, seated, in no evident distress Head: head normocephalic and atraumatic. Orohparynx benign Neck: supple with no carotid or supraclavicular bruits Cardiovascular: regular rate and rhythm, no murmurs Musculoskeletal: no deformity Skin:   no rash/petichiae Vascular:  Normal pulses all extremities Filed Vitals:   03/28/14 0828  BP: 111/67  Pulse: 61   Neurologic Exam Mental Status: Awake and fully alert. Oriented to place and time. Recent and remote memory intact. Attention span, concentration and fund of knowledge appropriate. Mood and affect appropriate.  Cranial Nerves: Fundoscopic exam reveals  Not done  . Pupils equal, briskly reactive to light. Extraocular movements full without nystagmus. Visual fields full to confrontation. Hearing intact. Facial sensation intact. Face, tongue, palate moves normally and symmetrically.  Motor: Normal bulk and tone. Normal strength in all tested extremity muscles. Sensory.: intact to touch and pinprick and vibratory sensation.  Coordination: Rapid alternating movements normal in all extremities. Finger-to-nose and heel-to-shin performed accurately bilaterally. Gait and Station: Arises from chair without difficulty. Stance is normal. Gait demonstrates normal stride length and balance . Able to heel, toe and tandem walk without difficulty.  Reflexes: 1+ and symmetric. 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 We also discussed her intracranial aneurysms which also appeared to be stable as per last CT angiogram from November 2015.She prefers conservative f/u for now. Recommend yearly CT angiograms and follow-up visits. Call earlier for questions.

## 2014-03-28 NOTE — Patient Instructions (Addendum)
I had a long discussion with the patient with regards to her remote stroke, stroke risk factors, risk factor modification and answered questions. We also discussed her intracranial aneurysms which also appeared to be stable as per last CT angiogram from November 2015.She prefers conservative f/u for now. Recommend yearly CT angiograms and follow-up visits. Call earlier for questions.

## 2014-04-30 DIAGNOSIS — M8580 Other specified disorders of bone density and structure, unspecified site: Secondary | ICD-10-CM | POA: Diagnosis not present

## 2014-07-10 DIAGNOSIS — H4011X2 Primary open-angle glaucoma, moderate stage: Secondary | ICD-10-CM | POA: Diagnosis not present

## 2014-08-18 ENCOUNTER — Encounter (HOSPITAL_COMMUNITY): Payer: Self-pay | Admitting: Emergency Medicine

## 2014-08-18 ENCOUNTER — Emergency Department (HOSPITAL_COMMUNITY)
Admission: EM | Admit: 2014-08-18 | Discharge: 2014-08-18 | Disposition: A | Discharge status: Home / Self Care | Payer: Medicare Other | Attending: Emergency Medicine | Admitting: Emergency Medicine

## 2014-08-18 ENCOUNTER — Emergency Department (HOSPITAL_COMMUNITY): Payer: Medicare Other

## 2014-08-18 DIAGNOSIS — Z79899 Other long term (current) drug therapy: Secondary | ICD-10-CM | POA: Diagnosis not present

## 2014-08-18 DIAGNOSIS — I10 Essential (primary) hypertension: Secondary | ICD-10-CM | POA: Diagnosis not present

## 2014-08-18 DIAGNOSIS — Z862 Personal history of diseases of the blood and blood-forming organs and certain disorders involving the immune mechanism: Secondary | ICD-10-CM | POA: Insufficient documentation

## 2014-08-18 DIAGNOSIS — I6523 Occlusion and stenosis of bilateral carotid arteries: Secondary | ICD-10-CM | POA: Diagnosis not present

## 2014-08-18 DIAGNOSIS — Z8742 Personal history of other diseases of the female genital tract: Secondary | ICD-10-CM | POA: Insufficient documentation

## 2014-08-18 DIAGNOSIS — H0520 Unspecified exophthalmos: Secondary | ICD-10-CM | POA: Diagnosis not present

## 2014-08-18 DIAGNOSIS — R42 Dizziness and giddiness: Secondary | ICD-10-CM | POA: Insufficient documentation

## 2014-08-18 DIAGNOSIS — Z8619 Personal history of other infectious and parasitic diseases: Secondary | ICD-10-CM | POA: Diagnosis not present

## 2014-08-18 DIAGNOSIS — I6522 Occlusion and stenosis of left carotid artery: Secondary | ICD-10-CM | POA: Diagnosis not present

## 2014-08-18 DIAGNOSIS — R531 Weakness: Secondary | ICD-10-CM | POA: Insufficient documentation

## 2014-08-18 DIAGNOSIS — I729 Aneurysm of unspecified site: Secondary | ICD-10-CM | POA: Diagnosis not present

## 2014-08-18 DIAGNOSIS — R110 Nausea: Secondary | ICD-10-CM | POA: Diagnosis not present

## 2014-08-18 DIAGNOSIS — Z7982 Long term (current) use of aspirin: Secondary | ICD-10-CM | POA: Insufficient documentation

## 2014-08-18 DIAGNOSIS — Z8673 Personal history of transient ischemic attack (TIA), and cerebral infarction without residual deficits: Secondary | ICD-10-CM | POA: Diagnosis not present

## 2014-08-18 LAB — CBC
HEMATOCRIT: 32.5 % — AB (ref 36.0–46.0)
Hemoglobin: 11.1 g/dL — ABNORMAL LOW (ref 12.0–15.0)
MCH: 30.7 pg (ref 26.0–34.0)
MCHC: 34.2 g/dL (ref 30.0–36.0)
MCV: 90 fL (ref 78.0–100.0)
Platelets: 287 10*3/uL (ref 150–400)
RBC: 3.61 MIL/uL — ABNORMAL LOW (ref 3.87–5.11)
RDW: 15.3 % (ref 11.5–15.5)
WBC: 4.9 10*3/uL (ref 4.0–10.5)

## 2014-08-18 LAB — I-STAT CHEM 8, ED
BUN: 21 mg/dL — ABNORMAL HIGH (ref 6–20)
CALCIUM ION: 1.22 mmol/L (ref 1.13–1.30)
Chloride: 107 mmol/L (ref 101–111)
Creatinine, Ser: 0.8 mg/dL (ref 0.44–1.00)
Glucose, Bld: 138 mg/dL — ABNORMAL HIGH (ref 65–99)
HCT: 37 % (ref 36.0–46.0)
Hemoglobin: 12.6 g/dL (ref 12.0–15.0)
Potassium: 3.8 mmol/L (ref 3.5–5.1)
Sodium: 140 mmol/L (ref 135–145)
TCO2: 22 mmol/L (ref 0–100)

## 2014-08-18 LAB — COMPREHENSIVE METABOLIC PANEL
ALK PHOS: 73 U/L (ref 38–126)
ALT: 15 U/L (ref 14–54)
ANION GAP: 5 (ref 5–15)
AST: 27 U/L (ref 15–41)
Albumin: 3.4 g/dL — ABNORMAL LOW (ref 3.5–5.0)
BILIRUBIN TOTAL: 0.5 mg/dL (ref 0.3–1.2)
BUN: 18 mg/dL (ref 6–20)
CO2: 26 mmol/L (ref 22–32)
Calcium: 9.2 mg/dL (ref 8.9–10.3)
Chloride: 108 mmol/L (ref 101–111)
Creatinine, Ser: 0.77 mg/dL (ref 0.44–1.00)
GFR calc Af Amer: >60 mL/min (ref >60)
Glucose, Bld: 140 mg/dL — ABNORMAL HIGH (ref 65–99)
POTASSIUM: 4 mmol/L (ref 3.5–5.1)
Sodium: 139 mmol/L (ref 135–145)
Total Protein: 7.1 g/dL (ref 6.5–8.1)

## 2014-08-18 LAB — DIFFERENTIAL
BASOS PCT: 0 % (ref 0–1)
Basophils Absolute: 0 10*3/uL (ref 0.0–0.1)
Eosinophils Absolute: 0 10*3/uL (ref 0.0–0.7)
Eosinophils Relative: 1 % (ref 0–5)
Lymphocytes Relative: 21 % (ref 12–46)
Lymphs Abs: 1 10*3/uL (ref 0.7–4.0)
MONO ABS: 0.3 10*3/uL (ref 0.1–1.0)
MONOS PCT: 7 % (ref 3–12)
NEUTROS ABS: 3.5 10*3/uL (ref 1.7–7.7)
Neutrophils Relative %: 71 % (ref 43–77)

## 2014-08-18 LAB — RAPID URINE DRUG SCREEN, HOSP PERFORMED
Amphetamines: NOT DETECTED
BARBITURATES: NOT DETECTED
Benzodiazepines: NOT DETECTED
Cocaine: NOT DETECTED
Opiates: NOT DETECTED
Tetrahydrocannabinol: NOT DETECTED

## 2014-08-18 LAB — URINALYSIS, ROUTINE W REFLEX MICROSCOPIC
Bilirubin Urine: NEGATIVE
Glucose, UA: NEGATIVE mg/dL
Hgb urine dipstick: NEGATIVE
Ketones, ur: NEGATIVE mg/dL
LEUKOCYTES UA: NEGATIVE
NITRITE: NEGATIVE
Protein, ur: 30 mg/dL — AB
Specific Gravity, Urine: 1.017 (ref 1.005–1.030)
Urobilinogen, UA: 0.2 mg/dL (ref 0.0–1.0)
pH: 7 (ref 5.0–8.0)

## 2014-08-18 LAB — URINE MICROSCOPIC-ADD ON

## 2014-08-18 LAB — PROTIME-INR
INR: 1 (ref 0.00–1.49)
Prothrombin Time: 13.4 seconds (ref 11.6–15.2)

## 2014-08-18 LAB — I-STAT TROPONIN, ED: TROPONIN I, POC: 0 ng/mL (ref 0.00–0.08)

## 2014-08-18 LAB — CBG MONITORING, ED: GLUCOSE-CAPILLARY: 109 mg/dL — AB (ref 65–99)

## 2014-08-18 LAB — ETHANOL

## 2014-08-18 LAB — APTT: aPTT: 27 seconds (ref 24–37)

## 2014-08-18 MED ORDER — MECLIZINE HCL 12.5 MG PO TABS: 12.5000 mg | ORAL_TABLET | Freq: Three times a day (TID) | ORAL | Status: DC | PRN

## 2014-08-18 MED ORDER — MECLIZINE HCL 25 MG PO TABS
25.0000 mg | ORAL_TABLET | Freq: Once | ORAL | Status: AC
  Administered 2014-08-18: 25 mg via ORAL
  Filled 2014-08-18: qty 1

## 2014-08-18 MED ORDER — ONDANSETRON HCL 4 MG PO TABS: 4.0000 mg | ORAL_TABLET | Freq: Four times a day (QID) | ORAL | Status: DC

## 2014-08-18 MED ORDER — ONDANSETRON HCL 4 MG/2ML IJ SOLN
4.0000 mg | Freq: Once | INTRAMUSCULAR | Status: AC
  Administered 2014-08-18: 4 mg via INTRAVENOUS
  Filled 2014-08-18: qty 2

## 2014-08-18 MED ORDER — IOHEXOL 350 MG/ML SOLN
100.0000 mL | Freq: Once | INTRAVENOUS | Status: AC | PRN
  Administered 2014-08-18: 100 mL via INTRAVENOUS

## 2014-08-18 NOTE — ED Notes (Signed)
Patient transported to CT 

## 2014-08-18 NOTE — ED Provider Notes (Signed)
CSN: [id number]     Arrival date & time 08/18/14  1013 History   First MD Initiated Contact with Patient 08/18/14 1035     Chief Complaint  Patient presents with  . Dizziness     (Consider location/radiation/quality/duration/timing/severity/associated sxs/prior Treatment) HPI Comments: Patient reports she awoke around 7:30 this morning with dizziness, nausea and room spinning sensation with feeling of anxiety. This is similar to when she had a stroke in 2007. She felt normal when she went to bed last night. She denies any focal weakness, numbness or tingling. No slurred speech or difficulty swallowing. She endorses nausea but no vomiting. No chest pain or shortness of breath. No vision changes. No headache. No abdominal pain or back pain. She states compliance with her Aggrenox.  The history is provided by the patient.    Past Medical History  Diagnosis Date  . Yeast infection   . Trichomonas   . Cystic breast   . Stroke     TIA  . Hypertension   . Anemia   . History of chicken pox   . History of measles   . History of mumps   . Trichomonas   . Cystic breast    Past Surgical History  Procedure Laterality Date  . Dilation and curettage of uterus    . Excision of fibroadenoma     Family History  Problem Relation Age of Onset  . Deep vein thrombosis Sister   . Diabetes Sister   . Hyperlipidemia Sister   . Cancer Mother   . Cancer Father   . Diabetes Daughter   . Hyperlipidemia Daughter    History  Substance Use Topics  . Smoking status: Never Smoker   . Smokeless tobacco: Never Used  . Alcohol Use: No   OB History    Gravida Para Term Preterm AB TAB SAB Ectopic Multiple Living   2 2 2       2      Review of Systems  Constitutional: Negative for fever, activity change and appetite change.  Eyes: Negative for visual disturbance.  Respiratory: Negative for apnea, cough and shortness of breath.   Gastrointestinal: Positive for nausea.  Genitourinary: Negative for  vaginal bleeding and vaginal discharge.  Musculoskeletal: Negative for myalgias and arthralgias.  Skin: Negative for rash.  Neurological: Positive for dizziness and weakness. Negative for speech difficulty, numbness and headaches.  A complete 10 system review of systems was obtained and all systems are negative except as noted in the HPI and PMH.      Allergies  Flagyl  Home Medications   Prior to Admission medications   Medication Sig Start Date End Date Taking? Authorizing Provider  acetaminophen (TYLENOL) 500 MG tablet Take 500 mg by mouth every 6 (six) hours as needed for mild pain or moderate pain.    Yes Historical Provider, MD  atorvastatin (LIPITOR) 20 MG tablet Take 20 mg by mouth daily.   Yes Historical Provider, MD  dipyridamole-aspirin (AGGRENOX) 200-25 MG per 12 hr capsule Take 1 capsule by mouth 2 (two) times daily. 03/28/14  Yes Garvin Fila, MD  latanoprost (XALATAN) 0.005 % ophthalmic solution Place 1 drop into both eyes at bedtime. 02/25/14  Yes Historical Provider, MD  metoprolol (LOPRESSOR) 50 MG tablet Take 25 mg by mouth 2 (two) times daily.    Yes Historical Provider, MD  Multiple Vitamins-Minerals (MULTIVITAMIN WITH MINERALS) tablet Take 1 tablet by mouth daily.   Yes Historical Provider, MD  valsartan-hydrochlorothiazide (DIOVAN-HCT) 80-12.5 MG per tablet  Take 1 tablet by mouth daily.   Yes Historical Provider, MD  meclizine (ANTIVERT) 12.5 MG tablet Take 1 tablet (12.5 mg total) by mouth 3 (three) times daily as needed for dizziness. 08/18/14   Ezequiel Essex, MD  ondansetron (ZOFRAN) 4 MG tablet Take 1 tablet (4 mg total) by mouth every 6 (six) hours. 08/18/14   Ezequiel Essex, MD   BP 148/70 mmHg  Pulse 68  Temp(Src) 97.9 F (36.6 C) (Oral)  Resp 12  SpO2 100% Physical Exam  Constitutional: She is oriented to person, place, and time. She appears well-developed and well-nourished. No distress.  HENT:  Head: Normocephalic and atraumatic.  Mouth/Throat:  Oropharynx is clear and moist. No oropharyngeal exudate.  Eyes: Conjunctivae and EOM are normal. Pupils are equal, round, and reactive to light.  Neck: Normal range of motion. Neck supple.  No meningismus.  Cardiovascular: Normal rate, regular rhythm, normal heart sounds and intact distal pulses.   No murmur heard. Pulmonary/Chest: Effort normal and breath sounds normal. No respiratory distress.  Abdominal: Soft. There is no tenderness. There is no rebound and no guarding.  Musculoskeletal: Normal range of motion. She exhibits no edema or tenderness.  Neurological: She is alert and oriented to person, place, and time. No cranial nerve deficit. She exhibits normal muscle tone. Coordination normal.  No ataxia on finger to nose bilaterally. No pronator drift. 5/5 strength throughout. CN 2-12 intact. Negative Romberg. Equal grip strength. Sensation intact. Gait is normal.  No nystagmus. Test of skew negative. Head impulse testing negative  Skin: Skin is warm.  Psychiatric: She has a normal mood and affect. Her behavior is normal.  Nursing note and vitals reviewed.   ED Course  Procedures (including critical care time) Labs Review Labs Reviewed  CBC - Abnormal; Notable for the following:    RBC 3.61 (*)    Hemoglobin 11.1 (*)    HCT 32.5 (*)    All other components within normal limits  COMPREHENSIVE METABOLIC PANEL - Abnormal; Notable for the following:    Glucose, Bld 140 (*)    Albumin 3.4 (*)    All other components within normal limits  URINALYSIS, ROUTINE W REFLEX MICROSCOPIC (NOT AT [facility]) - Abnormal; Notable for the following:    Protein, ur 30 (*)    All other components within normal limits  CBG MONITORING, ED - Abnormal; Notable for the following:    Glucose-Capillary 109 (*)    All other components within normal limits  I-STAT CHEM 8, ED - Abnormal; Notable for the following:    BUN 21 (*)    Glucose, Bld 138 (*)    All other components within normal limits  ETHANOL   PROTIME-INR  APTT  DIFFERENTIAL  URINE RAPID DRUG SCREEN, HOSP PERFORMED  URINE MICROSCOPIC-ADD ON  I-STAT TROPOININ, ED    Imaging Review Ct Angio Head W/cm &/or Wo Cm  08/18/2014   ADDENDUM REPORT: 08/18/2014 14:34  ADDENDUM: 6 mm calcification associated with the left tentorium may represent a tiny meningioma and has changed minimally over time measuring 4 mm on 05/06/2005 exam.   Electronically Signed   By: Genia Del M.D.   On: 08/18/2014 14:34   08/18/2014   CLINICAL DATA:  69 year old hypertensive female awoke with nausea and vomiting and dizziness. Similar symptoms during stroke 2007. Initial encounter.  EXAM: CT ANGIOGRAPHY HEAD AND NECK  TECHNIQUE: Multidetector CT imaging of the head and neck was performed using the standard protocol during bolus administration of intravenous contrast. Multiplanar CT  image reconstructions and MIPs were obtained to evaluate the vascular anatomy. Carotid stenosis measurements (when applicable) are obtained utilizing NASCET criteria, using the distal internal carotid diameter as the denominator.  CONTRAST:  114mL OMNIPAQUE IOHEXOL 350 MG/ML SOLN  COMPARISON:  12/19/2013 CT angiogram of the head. 05/06/2005 MR brain and MR angiogram.  FINDINGS: CT HEAD  Brain: No intracranial hemorrhage.  Remote infarct posterior left lenticular nucleus extending to the corona radiata. No CT evidence of large acute infarct. White matter type changes and streak artifact through the pons limit evaluation for detection of small infarct. Patient is scheduled for MR.  No intracranial mass or abnormal enhancement.  No hydrocephalus.  Calvarium and skull base: Negative.  Paranasal sinuses: Clear.  Orbits: Mild exophthalmos.  CTA NECK  Aortic arch: Common origin of left common carotid artery and innominate artery. Calcified plaque aortic arch. Plaque proximal left subclavian artery with mild narrowing.  Right carotid system: No significant stenosis. Ectatic vertical cervical segment.   Left carotid system: Calcified plaque carotid bifurcation without significant stenosis. Ectatic vertical cervical segment.  Vertebral arteries:Codominant vertebral arteries with mild ectasia 0 without significant stenosis.  Skeleton: Cervical spondylotic changes with spinal stenosis and cord flattening greatest C4-5 followed by C5-6 and C6-7 level. Cervical spine MR can be obtained for further delineation if clinically desired.  Other neck: No worrisome primary neck mass or adenopathy. Lung apices clear. New  CTA HEAD  Anterior circulation: Calcified plaque cavernous segment internal carotid artery bilaterally with mild to moderate narrowing.  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  Minimal increase in size of left cavernous sinus artery irregular aneurysm now measuring 8 x 6.8 x 5.4 mm versus prior 8 x 5 x 5 mm.  No significant stenosis of either carotid terminus or M1 segment of the middle cerebral artery.  Hypoplastic A1 segment right anterior cerebral artery with moderate narrowing proximally.  Posterior circulation: Ectatic vertebral arteries and basilar artery without high-grade stenosis.  Venous sinuses: Negative.  Anatomic variants: Negative.  Delayed phase: Negative.  IMPRESSION: No intracranial hemorrhage.  Remote infarct posterior left lenticular nucleus extending to the corona radiata. No CT evidence of large acute infarct. White matter type changes and streak artifact through the pons limit evaluation for detection of small infarct. Patient is scheduled for MR.  Calcified plaque cavernous segment internal carotid artery bilaterally with mild to moderate narrowing.  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  Minimal increase in size of left cavernous sinus artery irregular aneurysm now measuring 8 x 6.8 x 5.4 mm versus prior 8 x 5 x 5 mm.  No significant stenosis of either carotid terminus or M1 segment of the middle cerebral artery.  Hypoplastic A1 segment right anterior  cerebral artery with moderate narrowing proximally.  Ectatic vertebral arteries and basilar artery without high-grade stenosis.  No significant stenosis of either carotid bifurcation.  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 Cervical spine MR can be obtained for further delineation if clinically desired.  These results were called by telephone at the time of interpretation on 08/18/2014 at 1:30 pm to Dr. Ezequiel Essex , who verbally acknowledged these results.  Electronically Signed: By: Genia Del M.D. On: 08/18/2014 14:01   Ct Angio Neck W/cm &/or Wo/cm  08/18/2014   ADDENDUM REPORT: 08/18/2014 14:34  ADDENDUM: 6 mm calcification associated with the left tentorium may represent a tiny meningioma and has changed minimally over time measuring  4 mm on 05/06/2005 exam.   Electronically Signed   By: Genia Del M.D.   On: 08/18/2014 14:34   08/18/2014   CLINICAL DATA:  69 year old hypertensive female awoke with nausea and vomiting and dizziness. Similar symptoms during stroke 2007. Initial encounter.  EXAM: CT ANGIOGRAPHY HEAD AND NECK  TECHNIQUE: Multidetector CT imaging of the head and neck was performed using the standard protocol during bolus administration of intravenous contrast. Multiplanar CT image reconstructions and MIPs were obtained to evaluate the vascular anatomy. Carotid stenosis measurements (when applicable) are obtained utilizing NASCET criteria, using the distal internal carotid diameter as the denominator.  CONTRAST:  135mL OMNIPAQUE IOHEXOL 350 MG/ML SOLN  COMPARISON:  12/19/2013 CT angiogram of the head. 05/06/2005 MR brain and MR angiogram.  FINDINGS: CT HEAD  Brain: No intracranial hemorrhage.  Remote infarct posterior left lenticular nucleus extending to the corona radiata. No CT evidence of large acute infarct. White matter type changes and streak artifact through the pons limit evaluation for  detection of small infarct. Patient is scheduled for MR.  No intracranial mass or abnormal enhancement.  No hydrocephalus.  Calvarium and skull base: Negative.  Paranasal sinuses: Clear.  Orbits: Mild exophthalmos.  CTA NECK  Aortic arch: Common origin of left common carotid artery and innominate artery. Calcified plaque aortic arch. Plaque proximal left subclavian artery with mild narrowing.  Right carotid system: No significant stenosis. Ectatic vertical cervical segment.  Left carotid system: Calcified plaque carotid bifurcation without significant stenosis. Ectatic vertical cervical segment.  Vertebral arteries:Codominant vertebral arteries with mild ectasia 0 without significant stenosis.  Skeleton: Cervical spondylotic changes with spinal stenosis and cord flattening greatest C4-5 followed by C5-6 and C6-7 level. Cervical spine MR can be obtained for further delineation if clinically desired.  Other neck: No worrisome primary neck mass or adenopathy. Lung apices clear. New  CTA HEAD  Anterior circulation: Calcified plaque cavernous segment internal carotid artery bilaterally with mild to moderate narrowing.  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  Minimal increase in size of left cavernous sinus artery irregular aneurysm now measuring 8 x 6.8 x 5.4 mm versus prior 8 x 5 x 5 mm.  No significant stenosis of either carotid terminus or M1 segment of the middle cerebral artery.  Hypoplastic A1 segment right anterior cerebral artery with moderate narrowing proximally.  Posterior circulation: Ectatic vertebral arteries and basilar artery without high-grade stenosis.  Venous sinuses: Negative.  Anatomic variants: Negative.  Delayed phase: Negative.  IMPRESSION: No intracranial hemorrhage.  Remote infarct posterior left lenticular nucleus extending to the corona radiata. No CT evidence of large acute infarct. White matter type changes and streak artifact through the pons limit evaluation for detection  of small infarct. Patient is scheduled for MR.  Calcified plaque cavernous segment internal carotid artery bilaterally with mild to moderate narrowing.  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  Minimal increase in size of left cavernous sinus artery irregular aneurysm now measuring 8 x 6.8 x 5.4 mm versus prior 8 x 5 x 5 mm.  No significant stenosis of either carotid terminus or M1 segment of the middle cerebral artery.  Hypoplastic A1 segment right anterior cerebral artery with moderate narrowing proximally.  Ectatic vertebral arteries and basilar artery without high-grade stenosis.  No significant stenosis of either carotid bifurcation.  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 Cervical spine MR can be obtained for further delineation if clinically  desired.  These results were called by telephone at the time of interpretation on 08/18/2014 at 1:30 pm to Dr. Ezequiel Essex , who verbally acknowledged these results.  Electronically Signed: By: Genia Del M.D. On: 08/18/2014 14:01   Mr Brain Wo Contrast  08/18/2014   CLINICAL DATA:  69 year old hypertensive female with nausea, vomiting and dizziness. History of remote infarct 2007. History of intracranial aneurysms. Subsequent encounter.  EXAM: MRI HEAD WITHOUT CONTRAST  TECHNIQUE: Multiplanar, multiecho pulse sequences of the brain and surrounding structures were obtained without intravenous contrast.  COMPARISON:  08/18/2014 CT angiogram.  05/06/2005 MR.  FINDINGS: No acute infarct.  Remote small infarct left lenticular nucleus/ corona radiata.  Mild to slightly moderate small vessel disease type changes.  No intracranial hemorrhage.  Mild global atrophy without hydrocephalus.  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  Mild exophthalmos.  Major intracranial vascular structures are patent. Aneurysms better  visualized CT angiogram performed same date and dictated separately.  Partially empty sella without flattening of the posterior aspects of the globes to suggest pseudotumor cerebri.  Cervical medullary junction and pineal region unremarkable.  IMPRESSION: No acute infarct.  Remote small infarct left lenticular nucleus/ corona radiata.  Mild to slightly moderate small vessel disease type changes.  Mild global atrophy without hydrocephalus.  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  Mild exophthalmos.  Major intracranial vascular structures are patent. Aneurysms better visualized CT angiogram performed same date and dictated separately.   Electronically Signed   By: Genia Del M.D.   On: 08/18/2014 14:45     EKG Interpretation   Date/Time:  Saturday August 18 2014 10:26:40 EDT Ventricular Rate:  64 PR Interval:  178 QRS Duration: 90 QT Interval:  391 QTC Calculation: 403 R Axis:   41 Text Interpretation:  Sinus rhythm Probable left atrial enlargement RSR'  in V1 or V2, right VCD or RVH Baseline wander in lead(s) V3 V6 No  significant change was found Confirmed by Wyvonnia Dusky  MD, Andreas Sobolewski [phone]) on  08/18/2014 10:53:13 AM      MDM   Final diagnoses:  Aneurysm  Vertigo   Vertigo with nausea since this morning. Last seen normal last night. Code stroke was activated due to delay in presentation and minimal deficits.  Patient has a negative Romberg and is able to ambulate.  Record review shows history of basal ganglia infarct in 2007 and patient has multiple intracranial aneurysms that are being followed by Dr. Leonie Man.  "with remote left basal ganglia infarct in April 2007 from small vessel disease and known small right 3 x3 mm periopthalmic, left cavernous carotid and surpaclinoid aneurysms"   MRI shows no evidence of acute infarct. 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  Her dizziness has improved.  She is tolerating by mouth and ambulatory.  Enlarging aneurysms were discussed with Dr. Patrecia Pour. He will contact patient on Monday for an appointment on Monday or Tuesday.  Patient feels better she is ambulatory and denies any dizziness. She is tolerating by mouth and not having a vomiting. Will treat for peripheral vertigo with meclizine and zofran. Radiology results d/w Patient. Return precautions discussed.    Ezequiel Essex, MD 08/18/14 [phone]

## 2014-08-18 NOTE — ED Notes (Signed)
Pt states that she noticed around 0730 this morning, when she woke up, she was feeling dizzy and nauseated.  Pt states hx of stroke and states that she felt this way in 2007 when she had a stroke.  Pt is speaking clearly, with no slurred speech.  Pt was ambulatory to triage room with one assist.  Equal bilateral grip strengths.  Pt states she is about to throw up in triage.

## 2014-08-18 NOTE — ED Notes (Signed)
Patient transported to MRI 

## 2014-08-18 NOTE — ED Notes (Addendum)
Pt ambulated to bathroom with minimal assistance/supervision; sts her dizziness is resolving. Pt was able to drink water without feeling nauseous.

## 2014-08-18 NOTE — Discharge Instructions (Signed)
Dizziness Take for dizziness medication as prescribed. Dr. Estanislado Pandy will call you on Monday for an appointment next week. There is no evidence of stroke. Return to the ED if you develop new or worsening symptoms. Dizziness is a common problem. It is a feeling of unsteadiness or light-headedness. You may feel like you are about to faint. Dizziness can lead to injury if you stumble or fall. A person of any age group can suffer from dizziness, but dizziness is more common in older adults. CAUSES  Dizziness can be caused by many different things, including:  Middle ear problems.  Standing for too long.  Infections.  An allergic reaction.  Aging.  An emotional response to something, such as the sight of blood.  Side effects of medicines.  Tiredness.  Problems with circulation or blood pressure.  Excessive use of alcohol or medicines, or illegal drug use.  Breathing too fast (hyperventilation).  An irregular heart rhythm (arrhythmia).  A low red blood cell count (anemia).  Pregnancy.  Vomiting, diarrhea, fever, or other illnesses that cause body fluid loss (dehydration).  Diseases or conditions such as Parkinson's disease, high blood pressure (hypertension), diabetes, and thyroid problems.  Exposure to extreme heat. DIAGNOSIS  Your health care provider will ask about your symptoms, perform a physical exam, and perform an electrocardiogram (ECG) to record the electrical activity of your heart. Your health care provider may also perform other heart or blood tests to determine the cause of your dizziness. These may include:  Transthoracic echocardiogram (TTE). During echocardiography, sound waves are used to evaluate how blood flows through your heart.  Transesophageal echocardiogram (TEE).  Cardiac monitoring. This allows your health care provider to monitor your heart rate and rhythm in real time.  Holter monitor. This is a portable device that records your heartbeat and can  help diagnose heart arrhythmias. It allows your health care provider to track your heart activity for several days if needed.  Stress tests by exercise or by giving medicine that makes the heart beat faster. TREATMENT  Treatment of dizziness depends on the cause of your symptoms and can vary greatly. HOME CARE INSTRUCTIONS   Drink enough fluids to keep your urine clear or pale yellow. This is especially important in very hot weather. In older adults, it is also important in cold weather.  Take your medicine exactly as directed if your dizziness is caused by medicines. When taking blood pressure medicines, it is especially important to get up slowly.  Rise slowly from chairs and steady yourself until you feel okay.  In the morning, first sit up on the side of the bed. When you feel okay, stand slowly while holding onto something until you know your balance is fine.  Move your legs often if you need to stand in one place for a long time. Tighten and relax your muscles in your legs while standing.  Have someone stay with you for 1-2 days if dizziness continues to be a problem. Do this until you feel you are well enough to stay alone. Have the person call your health care provider if he or she notices changes in you that are concerning.  Do not drive or use heavy machinery if you feel dizzy.  Do not drink alcohol. SEEK IMMEDIATE MEDICAL CARE IF:   Your dizziness or light-headedness gets worse.  You feel nauseous or vomit.  You have problems talking, walking, or using your arms, hands, or legs.  You feel weak.  You are not thinking clearly or  you have trouble forming sentences. It may take a friend or family member to notice this.  You have chest pain, abdominal pain, shortness of breath, or sweating.  Your vision changes.  You notice any bleeding.  You have side effects from medicine that seems to be getting worse rather than better. MAKE SURE YOU:   Understand these  instructions.  Will watch your condition.  Will get help right away if you are not doing well or get worse. Document Released: 07/08/2000 Document Revised: 01/17/2013 Document Reviewed: 08/01/2010 [facility] Patient Information 2015 [place], [place]. This information is not intended to replace advice given to you by your health care provider. Make sure you discuss any questions you have with your health care provider.

## 2014-08-20 ENCOUNTER — Telehealth (HOSPITAL_COMMUNITY): Payer: Self-pay | Admitting: Interventional Radiology

## 2014-08-20 ENCOUNTER — Other Ambulatory Visit (HOSPITAL_COMMUNITY): Payer: Self-pay | Admitting: Interventional Radiology

## 2014-08-20 DIAGNOSIS — I671 Cerebral aneurysm, nonruptured: Secondary | ICD-10-CM

## 2014-08-21 ENCOUNTER — Ambulatory Visit (HOSPITAL_COMMUNITY)
Admission: RE | Admit: 2014-08-21 | Discharge: 2014-08-21 | Disposition: A | Discharge status: Home / Self Care | Payer: Medicare Other | Source: Ambulatory Visit | Attending: Interventional Radiology | Admitting: Interventional Radiology

## 2014-08-21 DIAGNOSIS — I671 Cerebral aneurysm, nonruptured: Secondary | ICD-10-CM | POA: Diagnosis not present

## 2014-10-31 DIAGNOSIS — R5383 Other fatigue: Secondary | ICD-10-CM | POA: Diagnosis not present

## 2014-10-31 DIAGNOSIS — E785 Hyperlipidemia, unspecified: Secondary | ICD-10-CM | POA: Diagnosis not present

## 2014-11-06 DIAGNOSIS — R1084 Generalized abdominal pain: Secondary | ICD-10-CM | POA: Diagnosis not present

## 2014-11-06 DIAGNOSIS — E785 Hyperlipidemia, unspecified: Secondary | ICD-10-CM | POA: Diagnosis not present

## 2014-11-06 DIAGNOSIS — H524 Presbyopia: Secondary | ICD-10-CM | POA: Diagnosis not present

## 2014-11-06 DIAGNOSIS — H401132 Primary open-angle glaucoma, bilateral, moderate stage: Secondary | ICD-10-CM | POA: Diagnosis not present

## 2014-11-06 DIAGNOSIS — R739 Hyperglycemia, unspecified: Secondary | ICD-10-CM | POA: Diagnosis not present

## 2014-11-06 DIAGNOSIS — H25013 Cortical age-related cataract, bilateral: Secondary | ICD-10-CM | POA: Diagnosis not present

## 2014-11-06 DIAGNOSIS — R5383 Other fatigue: Secondary | ICD-10-CM | POA: Diagnosis not present

## 2014-11-06 DIAGNOSIS — I10 Essential (primary) hypertension: Secondary | ICD-10-CM | POA: Diagnosis not present

## 2014-11-06 DIAGNOSIS — H43813 Vitreous degeneration, bilateral: Secondary | ICD-10-CM | POA: Diagnosis not present

## 2014-11-22 NOTE — Telephone Encounter (Signed)
Error

## 2015-01-24 DIAGNOSIS — Z803 Family history of malignant neoplasm of breast: Secondary | ICD-10-CM | POA: Diagnosis not present

## 2015-01-24 DIAGNOSIS — Z1231 Encounter for screening mammogram for malignant neoplasm of breast: Secondary | ICD-10-CM | POA: Diagnosis not present

## 2015-03-28 ENCOUNTER — Encounter: Payer: Self-pay | Admitting: Neurology

## 2015-03-28 ENCOUNTER — Ambulatory Visit (INDEPENDENT_AMBULATORY_CARE_PROVIDER_SITE_OTHER): Payer: Medicare Other | Admitting: Neurology

## 2015-03-28 VITALS — BP 154/76 | HR 59 | Ht 66.0 in | Wt 179.4 lb

## 2015-03-28 DIAGNOSIS — I671 Cerebral aneurysm, nonruptured: Secondary | ICD-10-CM | POA: Diagnosis not present

## 2015-03-28 NOTE — Patient Instructions (Addendum)
I had a long discussion with the patient regarding her remote stroke, cerebral aneurysms, risk of aneurysm rupture, available treatment options, risk for recurrent stroke, TIA and answered questions. Continue Aggrenox for secondary stroke prevention with strict control of hypertension with blood pressure goal below 130/90 and lipids with LDL cholesterol goal below 70 mg percent. I advised her to limit salt intake in her diet. Continue to check yearly CT angiograms to follow her intracranial aneurysms which she has elected to be treated conservatively. She'll return for follow-up in a year or call earlier if necessary

## 2015-03-28 NOTE — Progress Notes (Signed)
[facility] [address] [place]. 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 She states her blood pressure is well controlled and it is 140/74 in our office today. She remains very anxious about her asymptomatic aneurysms and again had lots of questions particularly as when I mentioned that most recent Ct angio 10/13/12  Showed slight increase 2-3 mm right paraopthalmic artery aneurysm and stable apperance of lobulated  8x5 mm left ICA aneurysm and 2 mm distal left ICA aneurysm. She had been referred to Dr [name] 2 years ago and had decided against intervention.She is tolerating aggrenox well and states lipids are well controlled when last checked by primary MD Update 03/28/2014 : She returns for follow-up after last visit a year ago. She continues to do well without recurrent stroke or TIA symptoms since her previous stroke in April 2007. She also denies headaches. She had follow-up CT angiogram done in November 2015 which I personally reviewed and showed stable appearance of the 3 x 3 mm) ophthalmic and 8 x 5 x 5 mm left cavernous carotid aneurysms. She has seen Dr. Estanislado [name] a few years ago for aneurysm treatment and prefers conservative follow-up for now. She has no new symptoms. Her blood pressure is well controlled and it is 111/67 today. Her last lipid profile was checked in May 2015 and was fine. She is tolerating statin without side effects. She was requesting a refill for Aggrenox for a year. She returns for  follow-up after last visit 1 year ago. She continues to do well from neurovascular standpoint without recurrent TIA or stroke symptoms. She had an episode of vertigo for which she was seen in the emergency room in July 2016. MRI scan of the brain did not reveal any acute and infarct and showed only old left basal ganglia lacunar infarct. CT angiogram of the brain showed stable 2-3 mm right paraophthalmic artery aneurysm and minimal increase in size of the left cavernous carotid irregular aneurysm now measuring 8 x 6.8 x 5.4 mm compared to previous size of 8 x 5 x 5 mm in November 2015. Patient has previously met with Dr. Estanislado [name] twice and discussed endovascular treatment and chosen conservative medical follow-up. She states her blood pressure is usually quite well controlled in the 130s at home though it is slightly elevated at 154/76 in office today. She is tolerating Aggrenox well for now nearly 10 years without any side effects. She remains on Lipitor and is tolerating it well and has a follow-up lipid profile plan for next week at her primary care physician's office. She had hemoglobin A1c checked last Saturday and it was 6.3. She has no complaints today. Update 03/28/2015 : ROS:   14 system review of systems is positive for vertigo, aching muscles only and all other systems negative  PMH:  Past Medical History  Diagnosis Date  . Yeast infection   . Trichomonas   . Cystic breast   . Stroke ([place])     TIA  . Hypertension   . Anemia   . History of  chicken pox   . History of measles   . History of mumps   . Trichomonas   . Cystic breast     Social History:  Social History   Social History  . Marital Status: Married    Spouse Name: N/A  . Number of Children: 2  . Years of Education: masters   Occupational History  . retired    Social History Main Topics  . Smoking status: Never Smoker   . Smokeless tobacco: Never Used  . Alcohol Use: No  . Drug Use: No  . Sexual Activity: Not  Currently    Birth Control/ Protection: Post-menopausal, Abstinence   Other Topics Concern  . Not on file   Social History Narrative    Medications:   Current Outpatient Prescriptions on File Prior to Visit  Medication Sig Dispense Refill  . acetaminophen (TYLENOL) 500 MG tablet Take 500 mg by mouth every 6 (six) hours as needed for mild pain or moderate pain.     Marland Kitchen atorvastatin (LIPITOR) 20 MG tablet Take 20 mg by mouth daily.    Marland Kitchen dipyridamole-aspirin (AGGRENOX) 200-25 MG per 12 hr capsule Take 1 capsule by mouth 2 (two) times daily. 180 capsule 3  . latanoprost (XALATAN) 0.005 % ophthalmic solution Place 1 drop into both eyes at bedtime.    . meclizine (ANTIVERT) 12.5 MG tablet Take 1 tablet (12.5 mg total) by mouth 3 (three) times daily as needed for dizziness. 30 tablet 0  . metoprolol (LOPRESSOR) 50 MG tablet Take 25 mg by mouth 2 (two) times daily.     . Multiple Vitamins-Minerals (MULTIVITAMIN WITH MINERALS) tablet Take 1 tablet by mouth daily.    . ondansetron (ZOFRAN) 4 MG tablet Take 1 tablet (4 mg total) by mouth every 6 (six) hours. (Patient taking differently: Take 4 mg by mouth every 8 (eight) hours as needed. ) 12 tablet 0  . valsartan-hydrochlorothiazide (DIOVAN-HCT) 80-12.5 MG per tablet Take 1 tablet by mouth daily.     No current facility-administered medications on file prior to visit.    Allergies:   Allergies  Allergen Reactions  . Flagyl [Metronidazole] Itching and Swelling    Physical Exam General: well developed, well nourished african american lady, seated, in no evident distress Head: head normocephalic and atraumatic. Orohparynx benign Neck: supple with no carotid or supraclavicular bruits Cardiovascular: regular rate and rhythm, no murmurs Musculoskeletal: no deformity Skin:  no rash/petichiae Vascular:  Normal pulses all extremities Filed Vitals:   03/28/15 0835  BP: 154/76  Pulse: 59   Neurologic Exam Mental Status: Awake and fully alert.  Oriented to place and time. Recent and remote memory intact. Attention span, concentration and fund of knowledge appropriate. Mood and affect appropriate.  Cranial Nerves: Fundoscopic exam reveals not done  . Pupils equal, briskly reactive to light. Extraocular movements full without nystagmus. Visual fields full to confrontation. Hearing intact. Facial sensation intact. Face, tongue, palate moves normally and symmetrically.  Motor: Normal bulk and tone. Normal strength in all tested extremity muscles. Sensory.: intact to touch and pinprick and vibratory sensation.  Coordination: Rapid alternating movements normal in all extremities. Finger-to-nose and heel-to-shin performed accurately bilaterally. Gait and Station: Arises from chair without difficulty. Stance is normal. Gait demonstrates normal stride length and balance . Able to heel, toe and tandem walk without difficulty.  Reflexes: 1+ and symmetric. 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 Continue Aggrenox for secondary stroke prevention with strict control of hypertension with blood pressure goal below 130/90 and lipids with LDL cholesterol goal below 70 mg percent. I advised her to limit salt intake in her diet. Continue to check yearly CT angiograms to follow her intracranial aneurysms which she has elected to be treated conservatively. She'll return for follow-up in a year or call earlier if necessary Antony Contras, MD

## 2015-04-10 ENCOUNTER — Other Ambulatory Visit: Payer: Self-pay | Admitting: Neurology

## 2015-05-09 DIAGNOSIS — Z0000 Encounter for general adult medical examination without abnormal findings: Secondary | ICD-10-CM | POA: Diagnosis not present

## 2015-05-09 DIAGNOSIS — N390 Urinary tract infection, site not specified: Secondary | ICD-10-CM | POA: Diagnosis not present

## 2015-05-09 DIAGNOSIS — I10 Essential (primary) hypertension: Secondary | ICD-10-CM | POA: Diagnosis not present

## 2015-05-14 DIAGNOSIS — I10 Essential (primary) hypertension: Secondary | ICD-10-CM | POA: Diagnosis not present

## 2015-05-14 DIAGNOSIS — R7303 Prediabetes: Secondary | ICD-10-CM | POA: Diagnosis not present

## 2015-05-14 DIAGNOSIS — I671 Cerebral aneurysm, nonruptured: Secondary | ICD-10-CM | POA: Diagnosis not present

## 2015-05-14 DIAGNOSIS — R011 Cardiac murmur, unspecified: Secondary | ICD-10-CM | POA: Diagnosis not present

## 2015-05-18 ENCOUNTER — Other Ambulatory Visit: Payer: Self-pay | Admitting: Neurology

## 2015-05-22 ENCOUNTER — Other Ambulatory Visit: Payer: Self-pay

## 2015-05-22 ENCOUNTER — Telehealth: Payer: Self-pay

## 2015-05-22 MED ORDER — ASPIRIN-DIPYRIDAMOLE ER 25-200 MG PO CP12: ORAL_CAPSULE | ORAL | Status: DC

## 2015-05-22 NOTE — Telephone Encounter (Signed)
Refill done for aspirin aggrenox

## 2015-05-22 NOTE — Telephone Encounter (Signed)
LFt vm for patient for patient to call back about making a yearly appointment. Rn did 3 month refills for patient. Pt was last seen 03/2015.

## 2015-06-11 DIAGNOSIS — H401112 Primary open-angle glaucoma, right eye, moderate stage: Secondary | ICD-10-CM | POA: Diagnosis not present

## 2015-06-11 DIAGNOSIS — H401122 Primary open-angle glaucoma, left eye, moderate stage: Secondary | ICD-10-CM | POA: Diagnosis not present

## 2015-06-19 ENCOUNTER — Telehealth: Payer: Self-pay | Admitting: Neurology

## 2015-06-19 NOTE — Telephone Encounter (Signed)
If patient calls back she needs to schedule an appt with Dr. Leonie Man for this issue.  Lft vm for patient to call back to schedule appt with Dr. Leonie Man. Pt is on a yearly appt. Pt having twitching on left side of face.

## 2015-06-19 NOTE — Telephone Encounter (Signed)
Patient reports new symptom of left facial twitching, that started Monday 06/17/15.  Patient was recently seen in March, would like to speak to Dr. Leonie Man about this.

## 2015-06-20 NOTE — Telephone Encounter (Signed)
I spoke to the patient she had transient left lower facial twitchings for less than a day which has resolved completely. She is unable to identify specific triggers. At this point I advised conservative follow-up but to call me if symptoms recurred. I reassured her that I do not believe this twitchings have any intent to do with her silent paraophthalmic aneurysms. She has an appointment to see me next year but may be worked in to be seen sooner if symptoms recur

## 2015-06-20 NOTE — Telephone Encounter (Signed)
Pt called back sts she thought the message RN left her said she had missed an appt but said she came to appt in March. Said she might have misunderstood the message. She just spoke with Dr Leonie Man and sts she does not need to make appt right now.

## 2015-09-18 ENCOUNTER — Telehealth: Payer: Self-pay | Admitting: Neurology

## 2015-09-18 NOTE — Telephone Encounter (Signed)
It is OK for me to see her this Friday as urgent follow up. She still need ongoing follow up with Dr. Leonie Man since she is Dr. Clydene Fake long term patient. Thanks.   Rosalin Hawking, MD PhD Stroke Neurology 09/18/2015 5:00 PM

## 2015-09-18 NOTE — Telephone Encounter (Signed)
[place] only

## 2015-09-18 NOTE — Telephone Encounter (Signed)
Dr Erlinda Hong- please advise. Would you like to do anything further?   Dr Leonie Man stopped in office a few minutes after patient called.  He stated to have pt go to ED to be evaluated. Then f/u with [place] with either Dr Erlinda Hong, or NP's.  I called pt and pt stated phone note incorrect. She had these sx on Monday. They have since resolved. I went to speak with Dr Leonie Man, but he was no longer in office.   She declined going to ED at this time and wants to come for f/u. She requested tomorrow after 2 or 3pm. Advised we do not have anything tomorrow with either NP at that time or Dr Erlinda Hong. She requested Friday morning at around [place] f/u with Dr Erlinda Hong at [place], check in 745am.   Advised pt to go to ED ASAP if she has new/worseing sx. Pt verbalized understanding.

## 2015-09-18 NOTE — Telephone Encounter (Signed)
Pt called in with facial numbness/ right sided , lasted all day. She states she feel she is walking around in a fog. Pain in lower back, chronic. Pain on right side of arm, for a while. She would like to make appt but I was unsure if pt would need a referral first. Please call and discuss.

## 2015-09-20 ENCOUNTER — Ambulatory Visit (INDEPENDENT_AMBULATORY_CARE_PROVIDER_SITE_OTHER): Payer: Medicare Other | Admitting: Neurology

## 2015-09-20 ENCOUNTER — Encounter: Payer: Self-pay | Admitting: Neurology

## 2015-09-20 VITALS — BP 129/66 | HR 68 | Ht 66.0 in | Wt 183.4 lb

## 2015-09-20 DIAGNOSIS — G451 Carotid artery syndrome (hemispheric): Secondary | ICD-10-CM | POA: Diagnosis not present

## 2015-09-20 DIAGNOSIS — M5010 Cervical disc disorder with radiculopathy, unspecified cervical region: Secondary | ICD-10-CM

## 2015-09-20 DIAGNOSIS — Z8673 Personal history of transient ischemic attack (TIA), and cerebral infarction without residual deficits: Secondary | ICD-10-CM | POA: Diagnosis not present

## 2015-09-20 DIAGNOSIS — G459 Transient cerebral ischemic attack, unspecified: Secondary | ICD-10-CM | POA: Insufficient documentation

## 2015-09-20 DIAGNOSIS — I671 Cerebral aneurysm, nonruptured: Secondary | ICD-10-CM | POA: Diagnosis not present

## 2015-09-20 DIAGNOSIS — M5030 Other cervical disc degeneration, unspecified cervical region: Secondary | ICD-10-CM | POA: Insufficient documentation

## 2015-09-20 NOTE — Patient Instructions (Addendum)
-   continue aggrenox and lipitor for stroke prevention - check BP and glucose at home - Follow up with your primary care physician for stroke risk factor modification. Recommend maintain blood pressure goal <130/80, diabetes with hemoglobin A1c goal below 7.0% and lipids with LDL cholesterol goal below 70 mg/dL.  - will do MRI brain and C-spine - will do CTA head and neck to evaluate brian aneurysm.  - will do 2D echo to evaluate heart function.  - follow up with Dr Shelia Media checking A1C and lipid panel - follow up with Dr. Leonie Man in 2 months.

## 2015-09-20 NOTE — Progress Notes (Signed)
STROKE NEUROLOGY FOLLOW UP NOTE  NAME: [name] DOB: Nov 11, 1945  REASON FOR VISIT: stroke follow up HISTORY FROM: pt and chart  Today we had the pleasure of seeing [name] in follow-up at our Neurology Clinic. Pt was accompanied by no one.   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   She stated that she had stroke in 04/2005 but no residue and followed with Dr. Leonie [name] in clinic, last seen 03/2015. For the last 6 months to a year, she started to have right sided neck pain, stiffness, with right shoulder and lateral arm pain with mildly limited ROM, with radiating pain to the forearm and right palm. Comes and goes, sometimes can be intense, but other time dull achy. It can happen every the other day. She also has LBP comes and goes, sometimes hard to get out of the car due to LBP.  However, last Monday, she had right side facial numbness, at the left cheek area, feels like norvacaine shot at dental office. The numbness lasted about one day and resolved. She denies any HA, weakness, speech or swallow difficulty, no arm/leg issues at that time. She is compliant with aggrenox and lipitor.  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 Patient has previously met with Dr. Estanislado [name] three times to discuss endovascular treatment and chosen conservative monitoring and follow up.  She had HTN and stable taking meds, today BP 129/66. She denies DM and she check her glucose at home was 115-130.     REVIEW OF SYSTEMS: Full 14 system review of systems performed and notable only for those  listed below and in HPI above, all others are negative:  Constitutional:   Cardiovascular: Occasional palpitations Ear/Nose/Throat:   Skin:  Eyes:  Blurry vision Respiratory:   Gastroitestinal:   Genitourinary:  Hematology/Lymphatic:   Endocrine:  Musculoskeletal:  Joint pain, aching muscles, occasional muscle cramps Allergy/Immunology:   Neurological:  Numbness Psychiatric:  Sleep:   The following represents the patient's updated allergies and side effects list: Allergies  Allergen Reactions  . Flagyl [Metronidazole] Itching and Swelling    The neurologically relevant items on the patient's problem list were reviewed on today's visit.  Neurologic Examination  A problem focused neurological exam (12 or more points of the single system neurologic examination, vital signs counts as 1 point, cranial nerves count for 8 points) was performed.  Blood pressure 129/66, pulse 68, height 5' 6" (1.676 m), weight 183 lb 6.4 oz (83.2 kg).  General - Well nourished, well developed, in no apparent distress.  Ophthalmologic - Sharp disc margins OU.   Cardiovascular - Regular rate and rhythm with no murmur.  Mental Status -  Level of arousal and orientation to time, place, and person were intact. Language including expression, naming, repetition, comprehension was assessed and found intact. Fund of Knowledge was assessed and was intact.  Cranial Nerves II - XII - II - Visual field intact OU. III, IV, VI - Extraocular movements intact. V - Facial sensation intact bilaterally. VII - Facial movement intact bilaterally. VIII - Hearing &  vestibular intact bilaterally. X - Palate elevates symmetrically. XI - Chin turning & shoulder shrug intact bilaterally. XII - Tongue protrusion intact.  Motor Strength - The patient's strength was normal in all extremities and pronator drift was absent.  Bulk was normal and fasciculations were absent.   Motor Tone - Muscle tone was assessed at the  neck and appendages and was normal.  Reflexes - The patient's reflexes were 1+ in all extremities and she had no pathological reflexes.  Sensory - Light touch, temperature/pinprick were assessed and were normal.    Coordination - The patient had normal movements in the hands and feet with no ataxia or dysmetria.  Tremor was absent.  Gait and Station - The patient's transfers, posture, gait, station, and turns were observed as normal.  Data reviewed: I personally reviewed the images and agree with the radiology interpretations.  08/18/14 CTA head and neck No intracranial hemorrhage. Remote infarct posterior left lenticular nucleus extending to the corona radiata. No CT evidence of large acute infarct. White matter type changes and streak artifact through the pons limit evaluation for detection of small infarct. Patient is scheduled for MR. Calcified plaque cavernous segment internal carotid artery bilaterally with mild to moderate narrowing. 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 Minimal increase in size of left cavernous sinus artery irregular aneurysm now measuring 8 x 6.8 x 5.4 mm versus prior 8 x 5 x 5 mm. No significant stenosis of either carotid terminus or M1 segment of the middle cerebral artery. Hypoplastic A1 segment right anterior cerebral artery with moderate narrowing proximally. Ectatic vertebral arteries and basilar artery without high-grade stenosis. No significant stenosis of either carotid bifurcation. 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 Cervical spine MR can be obtained for further delineation if clinically desired.  MRI brain 08/18/14 No acute infarct. Remote small infarct left lenticular nucleus/ corona radiata. Mild to slightly moderate small vessel disease type changes. Mild global atrophy without hydrocephalus. 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 Mild exophthalmos. Major intracranial vascular structures are patent. Aneurysms better visualized CT angiogram performed same date and dictated separately.  Assessment: As you may recall, she is a 70 y.o. African American female with PMH of HTN, brain aneurysm and left BG infarct 04/2005 followed up in clinic today for episode of left facial numbness. She has chronic right neck pain, stiffness, right shoulder and arm pain with radiating pain to the right forearm and palm, more consistent with cervical radiculopathy. However her left facial numbness is hard to associated with cervical radiculopathy, although no other associated symptoms, still concerning for TIA given left large cavernous ICA aneurysm. Complicated migraine cannot be ruled out however patient has no headache at that time and no history of migraine. Since her aneurysm imaging has been 1 year, will do MRI brain and MRI cervical along with CTA head and neck. Patient is reluctant to consider cerebral angiogram this time. Will also do 2-D echo for stroke workup. Patient will follow up her PCP for A1c and lipid panel monitoring.  Plan:  - continue aggrenox and lipitor for stroke prevention - check BP and glucose at home - Follow up with your primary care physician for stroke risk factor modification. Recommend maintain blood pressure goal <130/80, diabetes with hemoglobin A1c goal below 7.0% and lipids with LDL cholesterol goal below 70 mg/dL.  - will do MRI brain and C-spine -  will do CTA head and neck to evaluate cerebral aneurysm.  - will do 2D echo to finish stroke work up.  - follow up with PCP checking A1C and lipid panel - follow up with Dr. Leonie [name] in 2 months.   I spent more than 25 minutes of face to face time with the patient. Greater than 50% of time was spent in counseling and coordination of care. We discussed further stroke workup, and aneurysm monitoring.   Orders  Placed This Encounter  Procedures  . CT ANGIO NECK W OR WO CONTRAST    Standing Status:   Future    Standing Expiration Date:   11/19/2016    Order Specific Question:   If indicated for the ordered procedure, I authorize the administration of contrast media per Radiology protocol    Answer:   Yes    Order Specific Question:   Reason for Exam (SYMPTOM  OR DIAGNOSIS REQUIRED)    Answer:   aneurysm follow up    Order Specific Question:   Preferred imaging location?    Answer:   Internal  . CT ANGIO HEAD W OR WO CONTRAST    Standing Status:   Future    Standing Expiration Date:   11/19/2016    Order Specific Question:   If indicated for the ordered procedure, I authorize the administration of contrast media per Radiology protocol    Answer:   Yes    Order Specific Question:   Reason for Exam (SYMPTOM  OR DIAGNOSIS REQUIRED)    Answer:   aneurysm follow up    Order Specific Question:   Preferred imaging location?    Answer:   Internal  . MR Brain Wo Contrast    Standing Status:   Future    Standing Expiration Date:   11/21/2016    Order Specific Question:   Reason for Exam (SYMPTOM  OR DIAGNOSIS REQUIRED)    Answer:   TIA with right facial numbness    Order Specific Question:   Preferred imaging location?    Answer:   Internal    Order Specific Question:   Does the patient have a pacemaker or implanted devices?    Answer:   No    Order Specific Question:   What is the patient's sedation requirement?    Answer:   No Sedation  . MR Cervical Spine Wo Contrast    Standing Status:   Future    Standing Expiration Date:   11/19/2016    Order Specific Question:   Reason for Exam (SYMPTOM  OR DIAGNOSIS REQUIRED)    Answer:   cervical radiculopathy    Order Specific Question:   Preferred imaging location?    Answer:   Internal    Order Specific Question:   What is the patient's sedation requirement?    Answer:   No Sedation    Order Specific Question:   Does the patient have a pacemaker or  implanted devices?    Answer:   No  . ECHOCARDIOGRAM COMPLETE    Standing Status:   Future    Standing Expiration Date:   12/19/2016    Order Specific Question:   Where should this test be performed    Answer:   [facility])    Order Specific Question:   Does the patient weigh less than or greater than 250 lbs?    Answer:   Patient weighs less than 250 lbs    Order Specific Question:   Complete  or Limited study?    Answer:   Complete    Order Specific Question:   With Image Enhancing Agent or without Image Enhancing Agent?    Answer:   With Image Enhancing Agent    Order Specific Question:   Reason for exam-Echo    Answer:   TIA  435.9 / G45.9    No orders of the defined types were placed in this encounter.   Patient Instructions  - continue aggrenox and lipitor for stroke prevention - check BP and glucose at home - Follow up with your primary care physician for stroke risk factor modification. Recommend maintain blood pressure goal <130/80, diabetes with hemoglobin A1c goal below 7.0% and lipids with LDL cholesterol goal below 70 mg/dL.  - will do MRI brain and C-spine - will do CTA head and neck to evaluate brian aneurysm.  - will do 2D echo to evaluate heart function.  - follow up with Dr Shelia Media checking A1C and lipid panel - follow up with Dr. Leonie [name] in 2 months.    Rosalin Hawking, MD PhD [facility] [address] [place], [place] [address] [phone]

## 2015-10-23 ENCOUNTER — Telehealth: Payer: Self-pay | Admitting: Neurology

## 2015-10-23 NOTE — Telephone Encounter (Signed)
Patient saw Dr. Erlinda Hong on 8-25 and was to be scheduled for  MRI's  and CT scans. Please call patient.

## 2015-11-18 ENCOUNTER — Telehealth: Payer: Self-pay | Admitting: Neurology

## 2015-11-18 NOTE — Telephone Encounter (Signed)
Regina with [facility] called stating she spoke with th pt about scheduling an ECHO. Pt does not think she is to have one. Rollene Fare would like to know if the pt still needs it? Please call  [phone]

## 2015-11-18 NOTE — Telephone Encounter (Signed)
RN spoke with Rollene Fare about pt having ECHO. Rn stated patient was seen by Dr. Erlinda Hong, because Dr.Sethi was not available. Rn stated per Dr.Xu last note he stated pt needs to have 2d echo. Rollene Fare verbalized understanding and will call the patient.

## 2015-11-19 DIAGNOSIS — R7303 Prediabetes: Secondary | ICD-10-CM | POA: Diagnosis not present

## 2015-11-19 DIAGNOSIS — K625 Hemorrhage of anus and rectum: Secondary | ICD-10-CM | POA: Diagnosis not present

## 2015-11-21 ENCOUNTER — Telehealth: Payer: Self-pay

## 2015-11-21 ENCOUNTER — Encounter: Payer: Self-pay | Admitting: Neurology

## 2015-11-21 ENCOUNTER — Ambulatory Visit (INDEPENDENT_AMBULATORY_CARE_PROVIDER_SITE_OTHER): Payer: Medicare Other | Admitting: Neurology

## 2015-11-21 VITALS — BP 109/63 | HR 68 | Ht 66.0 in | Wt 183.0 lb

## 2015-11-21 DIAGNOSIS — K625 Hemorrhage of anus and rectum: Secondary | ICD-10-CM | POA: Diagnosis not present

## 2015-11-21 DIAGNOSIS — R200 Anesthesia of skin: Secondary | ICD-10-CM | POA: Diagnosis not present

## 2015-11-21 DIAGNOSIS — Z1211 Encounter for screening for malignant neoplasm of colon: Secondary | ICD-10-CM | POA: Diagnosis not present

## 2015-11-21 DIAGNOSIS — K640 First degree hemorrhoids: Secondary | ICD-10-CM | POA: Diagnosis not present

## 2015-11-21 NOTE — Progress Notes (Signed)
STROKE NEUROLOGY FOLLOW UP NOTE  NAME: [name] DOB: [birth year]  REASON FOR VISIT: stroke follow up HISTORY FROM: pt and chart  Today we had the pleasure of seeing [name] in follow-up at our Neurology Clinic. Pt was accompanied by no one.   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   She stated that she had stroke in 04/2005 but no residue and followed with Dr. Leonie Man in clinic, last seen 03/2015. For the last 6 months to a year, she started to have right sided neck pain, stiffness, with right shoulder and lateral arm pain with mildly limited ROM, with radiating pain to the forearm and right palm. Comes and goes, sometimes can be intense, but other time dull achy. It can happen every the other day. She also has LBP comes and goes, sometimes hard to get out of the car due to LBP.  However, last Monday, she had right side facial numbness, at the left cheek area, feels like norvacaine shot at dental office. The numbness lasted about one day and resolved. She denies any HA, weakness, speech or swallow difficulty, no arm/leg issues at that time. She is compliant with aggrenox and lipitor.  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 Patient has previously met with Dr. Estanislado Pandy three times to discuss endovascular treatment and chosen conservative monitoring and follow up.  She had HTN and stable taking meds, today BP 129/66. She denies DM and she check her glucose at home was 115-130.    Update 11/21/2015 : She returns for follow-up after last visit with Dr. showed 2 months  ago. She states she's not had any further episodes of numbness on cheek. She feels muscle tightness in the back of her head and neck but denies significant radicular pain. She did have an episode of her right hand fingers locking up after and morning when she was taking taken pending her wrist a lot in the kitchen. She did not undergo MRI scan the brain and cervical spine has been never got a call to schedule that but have now been scheduled for 12/02/15. Similarly CT angiogram of the brain for follow-up of aneurysms is also scheduled for the same day. Patient was seen by gastroenterologist Dr. Collene Mares today for rectal bleeding and is scheduled to undergo colonoscopy in December. She is concerned about risk of stroke and stopping Aggrenox for 5 days prior to the procedure.  REVIEW OF SYSTEMS: Full 14 system review of systems performed and notable only for those listed below and in HPI above, all others are negative:   Rectal bleeding, activity change, numbness in all the systems negative The following represents the patient's updated allergies and side effects list: Allergies  Allergen Reactions  . Flagyl [Metronidazole] Itching and Swelling    The neurologically relevant items on the patient's problem list were reviewed on today's visit.  Neurologic Examination  A problem focused neurological exam (12 or more points of the single system neurologic examination, vital signs counts as 1 point, cranial nerves count for 8 points) was performed.  Blood pressure  109/63, pulse 68, height _0  (1.676 m), weight 183 lb (83 kg).  General - Well nourished, well developed, in no apparent distress.  Ophthalmologic - funduscopic exam not done   Cardiovascular - Regular rate and rhythm with no murmur.  Mental Status -  Level of arousal and orientation to time, place, and person were intact. Language including expression, naming, repetition, comprehension was assessed and found intact. Fund of Knowledge was  assessed and was intact.  Cranial Nerves II - XII - II - Visual field intact OU. III, IV, VI - Extraocular movements intact. V - Facial sensation intact bilaterally. VII - Facial movement intact bilaterally. VIII - Hearing & vestibular intact bilaterally. X - Palate elevates symmetrically. XI - Chin turning & shoulder shrug intact bilaterally. XII - Tongue protrusion intact.  Motor Strength - The patient's strength was normal in all extremities and pronator drift was absent.  Bulk was normal and fasciculations were absent.   Motor Tone - Muscle tone was assessed at the neck and appendages and was normal.  Reflexes - The patient's reflexes were 1+ in all extremities and she had no pathological reflexes.  Sensory - Light touch, temperature/pinprick were assessed and were normal.    Coordination - The patient had normal movements in the hands and feet with no ataxia or dysmetria.  Tremor was absent.  Gait and Station - The patient's transfers, posture, gait, station, and turns were observed as normal.  Data reviewed: I personally reviewed the images and agree with the radiology interpretations.  08/18/14 CTA head and neck No intracranial hemorrhage. Remote infarct posterior left lenticular nucleus extending to the corona radiata. No CT evidence of large acute infarct. White matter type changes and streak artifact through the pons limit evaluation for detection of small infarct. Patient is scheduled for MR. Calcified plaque cavernous segment internal carotid artery bilaterally with mild to moderate narrowing. 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 Minimal increase in size of left cavernous sinus artery irregular aneurysm now measuring 8 x 6.8 x 5.4 mm versus prior 8 x 5 x 5 mm. No significant stenosis of either carotid terminus or M1 segment of the middle cerebral artery. Hypoplastic A1 segment right anterior cerebral artery with moderate narrowing  proximally. Ectatic vertebral arteries and basilar artery without high-grade stenosis. No significant stenosis of either carotid bifurcation. 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 Cervical spine MR can be obtained for further delineation if clinically desired.  MRI brain 08/18/14 No acute infarct. Remote small infarct left lenticular nucleus/ corona radiata. Mild to slightly moderate small vessel disease type changes. Mild global atrophy without hydrocephalus. 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 Mild exophthalmos. Major intracranial vascular structures are patent. Aneurysms better visualized CT angiogram performed same date and dictated separately.  Assessment: As you may recall, she is a 70 y.o. African American female with PMH of HTN, brain aneurysm and left BG infarct 04/2005 followed up in clinic today for episode of left facial numbness. She has chronic right neck pain, stiffness, right shoulder and arm pain with radiating pain to the right forearm and palm, more consistent with cervical radiculopathy. However her left facial numbness is hard to associated with cervical radiculopathy, although no other associated symptoms, still concerning for TIA given left large cavernous ICA aneurysm.   Plan:  - Patient Instructions  I had a long discussion with the patient  regarding her episode of transient right face numbness and burning of unclear etiology and agree with plan to check MRI scan of the brain, cervical spine and follow-up CT angiogram for her brain aneurysms. These tests are scheduled for 12/02/15. I also advised her to wear wrist extension splint if she is 1 to be involved in activities with rapid repetitive and flexion. She was given neurological clearance to undergo colonoscopy and to hold Aggrenox 5 days prior to the procedure and to  resume it after the procedure. Continue Aggrenox for stroke prevention with strict control of lipids with LDL cholesterol goal below 70 mg percent and hypertension with blood pressure goal below 130/90. She will return for follow-up in 6 months or call earlier if necessaryI spent more than 25 minutes of face to face time with the patient. Greater than 50% of time was spent in counseling and coordination of care. We discussed further stroke workup, and aneurysm monitoring.   Antony Contras, MD [facility] [address], [place],  [address] [phone]

## 2015-11-21 NOTE — Telephone Encounter (Signed)
Rn call [place] with patient in the exam room so she can schedule her test. PT schedule MRi brain, Cervical spine, and Ct angio head. Pt put December 02, 2015 to check in at 0945am. Pt having test at 1000 and 1200pm.

## 2015-11-21 NOTE — Telephone Encounter (Signed)
Pt was in the office for a office visit with Dr. Leonie Man. Pt stated no one call her about scheduling any test. RN stated per the order, [facility] left her a vm to schedule. Pt stated she does not recall getting a voice mail. Rn contacted Andee Poles in MRI referrals and she stated GI left pt a vm.

## 2015-11-21 NOTE — Patient Instructions (Signed)
I had a long discussion with the patient regarding her episode of transient right face numbness and burning of unclear etiology and agree with plan to check MRI scan of the brain, cervical spine and follow-up CT angiogram for her brain aneurysms. These tests are scheduled for 12/02/15. I also advised her to wear wrist extension splint if she is 1 to be involved in activities with rapid repetitive and flexion. She was given neurological clearance to undergo colonoscopy and to hold Aggrenox 5 days prior to the procedure and to resume it after the procedure. Continue Aggrenox for stroke prevention with strict control of lipids with LDL cholesterol goal below 70 mg percent and hypertension with blood pressure goal below 130/90. She will return for follow-up in 6 months or call earlier if necessary

## 2015-11-25 NOTE — Telephone Encounter (Signed)
Patient schedule at GI on 12/02/15

## 2015-12-02 ENCOUNTER — Ambulatory Visit
Admission: RE | Admit: 2015-12-02 | Discharge: 2015-12-02 | Disposition: A | Discharge status: Home / Self Care | Payer: Medicare Other | Source: Ambulatory Visit | Attending: Neurology | Admitting: Neurology

## 2015-12-02 DIAGNOSIS — I671 Cerebral aneurysm, nonruptured: Secondary | ICD-10-CM

## 2015-12-02 DIAGNOSIS — M4802 Spinal stenosis, cervical region: Secondary | ICD-10-CM | POA: Diagnosis not present

## 2015-12-02 DIAGNOSIS — M5010 Cervical disc disorder with radiculopathy, unspecified cervical region: Secondary | ICD-10-CM

## 2015-12-02 DIAGNOSIS — G451 Carotid artery syndrome (hemispheric): Secondary | ICD-10-CM | POA: Diagnosis not present

## 2015-12-02 DIAGNOSIS — G459 Transient cerebral ischemic attack, unspecified: Secondary | ICD-10-CM | POA: Diagnosis not present

## 2015-12-02 MED ORDER — IOPAMIDOL (ISOVUE-370) INJECTION 76%
75.0000 mL | Freq: Once | INTRAVENOUS | Status: AC | PRN
  Administered 2015-12-02: 75 mL via INTRAVENOUS

## 2015-12-10 NOTE — Progress Notes (Signed)
Clearance form fax to Dr. Collene Mares at [phone] for colonoscopy.Form fax twice to [phone].

## 2015-12-11 ENCOUNTER — Other Ambulatory Visit: Payer: Self-pay | Admitting: Neurology

## 2015-12-11 DIAGNOSIS — M4802 Spinal stenosis, cervical region: Secondary | ICD-10-CM

## 2015-12-12 ENCOUNTER — Telehealth: Payer: Self-pay | Admitting: Neurology

## 2015-12-12 NOTE — Telephone Encounter (Signed)
[name] about removing the patient from the workqueue.

## 2015-12-12 NOTE — Telephone Encounter (Signed)
Rn call Rollene Fare at [facility] about patients 2decho schedule. Rn stated patient was last seen month for follow up. Rn stated during the visit pt verbalized she has a cardiologist who can do her echo if needed.Rn stated the pt did not want the 2decho at last visit.Regina verbalized understanding.

## 2015-12-12 NOTE — Telephone Encounter (Signed)
[facility] [phone] called said she has spoken with the pt on 2 different occasions and the pt said she does not need the 2D Echo. She is wanting confirmation before she calls the pt again that the pt does need this test.

## 2015-12-14 DIAGNOSIS — M25511 Pain in right shoulder: Secondary | ICD-10-CM | POA: Diagnosis not present

## 2015-12-16 DIAGNOSIS — H2513 Age-related nuclear cataract, bilateral: Secondary | ICD-10-CM | POA: Diagnosis not present

## 2015-12-16 DIAGNOSIS — H401122 Primary open-angle glaucoma, left eye, moderate stage: Secondary | ICD-10-CM | POA: Diagnosis not present

## 2015-12-16 DIAGNOSIS — H524 Presbyopia: Secondary | ICD-10-CM | POA: Diagnosis not present

## 2015-12-16 DIAGNOSIS — H401112 Primary open-angle glaucoma, right eye, moderate stage: Secondary | ICD-10-CM | POA: Diagnosis not present

## 2015-12-23 DIAGNOSIS — K625 Hemorrhage of anus and rectum: Secondary | ICD-10-CM | POA: Diagnosis not present

## 2015-12-23 DIAGNOSIS — M25511 Pain in right shoulder: Secondary | ICD-10-CM | POA: Diagnosis not present

## 2015-12-26 DIAGNOSIS — M25511 Pain in right shoulder: Secondary | ICD-10-CM | POA: Diagnosis not present

## 2015-12-28 ENCOUNTER — Other Ambulatory Visit: Payer: Self-pay | Admitting: Neurology

## 2016-01-15 ENCOUNTER — Telehealth: Payer: Self-pay | Admitting: Neurology

## 2016-01-15 DIAGNOSIS — Z1211 Encounter for screening for malignant neoplasm of colon: Secondary | ICD-10-CM | POA: Diagnosis not present

## 2016-01-15 DIAGNOSIS — D127 Benign neoplasm of rectosigmoid junction: Secondary | ICD-10-CM | POA: Diagnosis not present

## 2016-01-15 DIAGNOSIS — K635 Polyp of colon: Secondary | ICD-10-CM | POA: Diagnosis not present

## 2016-01-15 NOTE — Telephone Encounter (Signed)
Patient had a colonoscopy today and Dr. Collene Mares removed 2 polyps and advised the patient to stay off dipyridamole-aspirin (AGGRENOX) 200-25 MG 12hr capsule for 7 more days. Please call and advise.

## 2016-01-15 NOTE — Telephone Encounter (Signed)
I think what I can say at this time is that holding off aggrenox for 5-7 days may be appropriate since Dr. Collene Mares seems to feel that aggrenox at this time likely increases the risk of GI bleeding. However, pt has to be aware that it carries a small but acceptable risk for TIA/stroke while off aggrenox. If pt feels any neuro changes, please call us or go to ED for evaluation. After 5-7 days, she can safely restart aggrenox if no frank bleeding. Thanks.   Rosalin Hawking, MD PhD Stroke Neurology 01/15/2016 6:25 PM

## 2016-01-16 NOTE — Telephone Encounter (Signed)
RN call patient back about her being off aspirin/aggrenox for 68more days. Pt had her colonoscopy yesterday. Pt was already off the medication 5 days prior the procedure. PT stated 2 more polyps were found, and Dr. Collene Mares GI doctor wants her to be off for 7 more days. Rn gave patients Dr. Erlinda Hong below recommendation. Rn explain Dr. Leonie Man is out on vacation till next week. Rn gave patients Dr. Erlinda Hong message that if Dr. Collene Mares recommend 7 more days off the aspirin, that there is a small  risk of TIA/ Stroke while off the med. Pt states she does not want have major bleeding or have another stroke. Pt will call Dr. Collene Mares to report Dr. Erlinda Hong advice. She will ask Dr. Collene Mares if she can take it once a day. Pt verbalized understanding.

## 2016-02-04 DIAGNOSIS — Z803 Family history of malignant neoplasm of breast: Secondary | ICD-10-CM | POA: Diagnosis not present

## 2016-02-04 DIAGNOSIS — Z1231 Encounter for screening mammogram for malignant neoplasm of breast: Secondary | ICD-10-CM | POA: Diagnosis not present

## 2016-03-10 DIAGNOSIS — H401132 Primary open-angle glaucoma, bilateral, moderate stage: Secondary | ICD-10-CM | POA: Diagnosis not present

## 2016-03-25 ENCOUNTER — Other Ambulatory Visit: Payer: Self-pay | Admitting: Cardiology

## 2016-03-25 DIAGNOSIS — R1010 Upper abdominal pain, unspecified: Secondary | ICD-10-CM

## 2016-03-26 ENCOUNTER — Ambulatory Visit
Admission: RE | Admit: 2016-03-26 | Discharge: 2016-03-26 | Disposition: A | Discharge status: Home / Self Care | Payer: Medicare Other | Source: Ambulatory Visit | Attending: Cardiology | Admitting: Cardiology

## 2016-03-26 DIAGNOSIS — R1010 Upper abdominal pain, unspecified: Secondary | ICD-10-CM

## 2016-03-26 DIAGNOSIS — R1011 Right upper quadrant pain: Secondary | ICD-10-CM | POA: Diagnosis not present

## 2016-04-07 ENCOUNTER — Ambulatory Visit: Payer: Medicare Other | Admitting: Neurology

## 2016-04-07 ENCOUNTER — Other Ambulatory Visit: Payer: Self-pay | Admitting: Gastroenterology

## 2016-04-07 DIAGNOSIS — R1011 Right upper quadrant pain: Secondary | ICD-10-CM | POA: Diagnosis not present

## 2016-04-07 DIAGNOSIS — R933 Abnormal findings on diagnostic imaging of other parts of digestive tract: Secondary | ICD-10-CM | POA: Diagnosis not present

## 2016-04-28 ENCOUNTER — Ambulatory Visit
Admission: RE | Admit: 2016-04-28 | Discharge: 2016-04-28 | Disposition: A | Discharge status: Home / Self Care | Payer: Medicare Other | Source: Ambulatory Visit | Attending: Gastroenterology | Admitting: Gastroenterology

## 2016-04-28 DIAGNOSIS — R1011 Right upper quadrant pain: Secondary | ICD-10-CM

## 2016-04-28 MED ORDER — IOPAMIDOL (ISOVUE-300) INJECTION 61%
100.0000 mL | Freq: Once | INTRAVENOUS | Status: AC | PRN
  Administered 2016-04-28: 100 mL via INTRAVENOUS

## 2016-05-05 ENCOUNTER — Ambulatory Visit (INDEPENDENT_AMBULATORY_CARE_PROVIDER_SITE_OTHER): Payer: Medicare Other | Admitting: Neurology

## 2016-05-05 ENCOUNTER — Encounter: Payer: Self-pay | Admitting: Neurology

## 2016-05-05 ENCOUNTER — Encounter (INDEPENDENT_AMBULATORY_CARE_PROVIDER_SITE_OTHER): Payer: Self-pay

## 2016-05-05 VITALS — BP 120/63 | HR 56 | Wt 165.6 lb

## 2016-05-05 DIAGNOSIS — G588 Other specified mononeuropathies: Principal | ICD-10-CM

## 2016-05-05 DIAGNOSIS — G5732 Lesion of lateral popliteal nerve, left lower limb: Secondary | ICD-10-CM

## 2016-05-05 NOTE — Patient Instructions (Signed)
I had a long discussion the patient with regards to her intracranial aneurysms which appear to be stable and recommend continued conservative follow-up and yearly checkup with CT angiograms. 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 She will maintain strict control of hypertension with blood pressure goal below 130/90 and return for follow-up in a year or call earlier if necessary

## 2016-05-05 NOTE — Progress Notes (Signed)
STROKE NEUROLOGY FOLLOW UP NOTE  NAME: [name] DOB: [birth year]  REASON FOR VISIT: stroke follow up HISTORY FROM: pt and chart  Today we had the pleasure of seeing [name] in follow-up at our Neurology Clinic. Pt was accompanied by no one.   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   She stated that she had stroke in 04/2005 but no residue and followed with Dr. Leonie Man in clinic, last seen 03/2015. For the last 6 months to a year, she started to have right sided neck pain, stiffness, with right shoulder and lateral arm pain with mildly limited ROM, with radiating pain to the forearm and right palm. Comes and goes, sometimes can be intense, but other time dull achy. It can happen every the other day. She also has LBP comes and goes, sometimes hard to get out of the car due to LBP.  However, last Monday, she had right side facial numbness, at the left cheek area, feels like norvacaine shot at dental office. The numbness lasted about one day and resolved. She denies any HA, weakness, speech or swallow difficulty, no arm/leg issues at that time. She is compliant with aggrenox and lipitor.  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 Patient has previously met with Dr. Estanislado Pandy three times to discuss endovascular treatment and chosen conservative monitoring and follow up.  She had HTN and stable taking meds, today BP 129/66. She denies DM and she check her glucose at home was 115-130.    Update 11/21/2015 : She returns for follow-up after last visit with Dr. showed 2 months  ago. She states she's not had any further episodes of numbness on cheek. She feels muscle tightness in the back of her head and neck but denies significant radicular pain. She did have an episode of her right hand fingers locking up after and morning when she was taking taken pending her wrist a lot in the kitchen. She did not undergo MRI scan the brain and cervical spine has been never got a call to schedule that but have now been scheduled for 12/02/15. Similarly CT angiogram of the brain for follow-up of aneurysms is also scheduled for the same day. Patient was seen by gastroenterologist Dr. Collene Mares today for rectal bleeding and is scheduled to undergo colonoscopy in December. She is concerned about risk of stroke and stopping Aggrenox for 5 days prior to the procedure.  Update 05/05/2016 : She returns for follow-up after last visit 6 months ago. She continues to do well without recurrent stroke or TIA symptoms. She had follow-up CT angiogram of the brain and neck done on 12/02/15 which showed stable appearance of her intracranial aneurysms. Patient states his started walking regularly and has lost 20 pounds. She occasionally hears her heartbeat in the ears particularly at night when she is lying down quietly. She denies significant stress. She is also noticed some intermittent numbness in the lower lateral aspect of the left foot. She does agree that she sleeps on her left side a lot. She denies significant back pain or radicular  pain. She has not had any recurrent stroke or TIA symptoms. She states her blood pressure is well controlled today it is 120/63. REVIEW OF SYSTEMS: Full 14 system review of systems performed and notable only for those listed below and in HPI above, all others are negative:    Tinnitus, palpitations, numbness in the leg all othersystems negative The following represents the patient's updated allergies and side effects list: Allergies  Allergen Reactions  . Flagyl [Metronidazole]  Itching and Swelling    The neurologically relevant items on the patient's problem list were reviewed on today's visit.  Neurologic Examination  A problem focused neurological exam (12 or more points of the single system neurologic examination, vital signs counts as 1 point, cranial nerves count for 8 points) was performed.  Blood pressure 120/63, pulse (!) 56, weight 165 lb 9.6 oz (75.1 kg).  General - Well nourished, well developed, in no apparent distress.  Ophthalmologic - funduscopic exam not done   Cardiovascular - Regular rate and rhythm with no murmur.  Mental Status -  Level of arousal and orientation to time, place, and person were intact. Language including expression, naming, repetition, comprehension was assessed and found intact. Fund of Knowledge was assessed and was intact.  Cranial Nerves II - XII - II - Visual field intact OU. III, IV, VI - Extraocular movements intact. V - Facial sensation intact bilaterally. VII - Facial movement intact bilaterally. VIII - Hearing & vestibular intact bilaterally. X - Palate elevates symmetrically. XI - Chin turning & shoulder shrug intact bilaterally. XII - Tongue protrusion intact.  Motor Strength - The patient's strength was normal in all extremities and pronator drift was absent.  Bulk was normal and fasciculations were absent.   Motor Tone - Muscle tone was assessed at the neck and appendages and was normal.  Reflexes - The patient's reflexes were 1+ in all extremities and she had no pathological reflexes.  Sensory - Light touch, temperature/pinprick were assessed and were normal.    Coordination - The patient had normal movements in the hands and feet with no ataxia or dysmetria.  Tremor was absent.  Gait and Station - The patient's transfers, posture, gait, station, and turns were observed as normal.  Data reviewed: I personally reviewed the images and agree with the radiology interpretations.  CT angio brain  and neck 12/02/2015 :  1. 8 mm left cavernous ICA aneurysm is unchanged compared to 08/18/2014. 2. 2-3 mm right ophthalmic ICA aneurysm is unchanged from prior. 3. 1-2 mm outpouching from the supraclinoid left ICA is stable and favors infundibulum.  Assessment:   she is a 71 y.o. 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   Plan:  -I had a long discussion the patient with regards to her intracranial aneurysms which appear to be stable and recommend continued conservative follow-up and yearly checkup with CT angiograms. 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 She will maintain strict control of hypertension with blood pressure goal below 130/90 and return for follow-up in a year or call earlier if necessary. Greater than 50% time during this 25 minute visit was spent on counseling and coordination of care about her asymptomatic cerebral aneurysms, stroke and TIA risk, left foot peroneal neuropathy and  answering questions  . Antony Contras, MD  [facility] [address] [place], [place] [address]  Phone [phone] Fax [phone]  Antony Contras, MD [facility] [address], [place], [place] [address] [phone]

## 2016-05-13 DIAGNOSIS — Z0000 Encounter for general adult medical examination without abnormal findings: Secondary | ICD-10-CM | POA: Diagnosis not present

## 2016-05-13 DIAGNOSIS — E559 Vitamin D deficiency, unspecified: Secondary | ICD-10-CM | POA: Diagnosis not present

## 2016-05-13 DIAGNOSIS — E7800 Pure hypercholesterolemia, unspecified: Secondary | ICD-10-CM | POA: Diagnosis not present

## 2016-05-13 DIAGNOSIS — M722 Plantar fascial fibromatosis: Secondary | ICD-10-CM | POA: Diagnosis not present

## 2016-05-19 DIAGNOSIS — Z0000 Encounter for general adult medical examination without abnormal findings: Secondary | ICD-10-CM | POA: Diagnosis not present

## 2016-05-19 DIAGNOSIS — Z1159 Encounter for screening for other viral diseases: Secondary | ICD-10-CM | POA: Diagnosis not present

## 2016-05-29 ENCOUNTER — Telehealth: Payer: Self-pay | Admitting: Neurology

## 2016-05-29 ENCOUNTER — Other Ambulatory Visit: Payer: Self-pay | Admitting: *Deleted

## 2016-05-29 MED ORDER — ASPIRIN-DIPYRIDAMOLE ER 25-200 MG PO CP12: ORAL_CAPSULE | ORAL | 0 refills | Status: DC

## 2016-05-29 NOTE — Telephone Encounter (Signed)
Pt asking to be called once this is done so she can contact [facility]  To pay for the mail order so hopefully it could be shipped out today due to her limited supply

## 2016-05-29 NOTE — Telephone Encounter (Signed)
90-day sent to [facility] - patient aware.

## 2016-05-29 NOTE — Telephone Encounter (Signed)
Pt is asking that [facility] be contacted for the prescription dipyridamole-aspirin (AGGRENOX) 200-25 MG 12hr capsule their # is [phone] . Pt is down to 3 days worth. Please advise

## 2016-07-13 DIAGNOSIS — H401111 Primary open-angle glaucoma, right eye, mild stage: Secondary | ICD-10-CM | POA: Diagnosis not present

## 2016-07-13 DIAGNOSIS — H401122 Primary open-angle glaucoma, left eye, moderate stage: Secondary | ICD-10-CM | POA: Diagnosis not present

## 2016-07-17 ENCOUNTER — Other Ambulatory Visit: Payer: Self-pay | Admitting: Neurology

## 2016-08-04 ENCOUNTER — Other Ambulatory Visit: Payer: Self-pay

## 2016-08-04 ENCOUNTER — Telehealth: Payer: Self-pay | Admitting: Neurology

## 2016-08-04 DIAGNOSIS — I639 Cerebral infarction, unspecified: Secondary | ICD-10-CM | POA: Diagnosis not present

## 2016-08-04 DIAGNOSIS — E785 Hyperlipidemia, unspecified: Secondary | ICD-10-CM | POA: Diagnosis not present

## 2016-08-04 DIAGNOSIS — R002 Palpitations: Secondary | ICD-10-CM | POA: Diagnosis not present

## 2016-08-04 DIAGNOSIS — I10 Essential (primary) hypertension: Secondary | ICD-10-CM | POA: Diagnosis not present

## 2016-08-04 MED ORDER — ASPIRIN-DIPYRIDAMOLE ER 25-200 MG PO CP12: ORAL_CAPSULE | ORAL | 1 refills | Status: DC

## 2016-08-04 NOTE — Telephone Encounter (Signed)
Rn sent aspirin aggrenox refill to [facility] per patients request.

## 2016-08-04 NOTE — Telephone Encounter (Signed)
Patient would like refill for dipyridamole-aspirin (AGGRENOX) 200-25 MG 12hr capsule sent to [facility]

## 2016-08-05 ENCOUNTER — Telehealth: Payer: Self-pay | Admitting: Neurology

## 2016-08-05 NOTE — Telephone Encounter (Signed)
Patient requesting refill of dipyridamole-aspirin (AGGRENOX) 200-25 MG 12hr capsule called to [facility]. Patient did not Rx at [facility] because too expensive.

## 2016-08-06 ENCOUNTER — Other Ambulatory Visit: Payer: Self-pay

## 2016-08-06 MED ORDER — ASPIRIN-DIPYRIDAMOLE ER 25-200 MG PO CP12: ORAL_CAPSULE | ORAL | 4 refills | Status: DC

## 2016-08-06 NOTE — Telephone Encounter (Signed)
Refills for a year sent to [facility] for aspirin/aggrenox per pts request to [facility].

## 2016-08-18 DIAGNOSIS — E7800 Pure hypercholesterolemia, unspecified: Secondary | ICD-10-CM | POA: Diagnosis not present

## 2016-08-20 DIAGNOSIS — I10 Essential (primary) hypertension: Secondary | ICD-10-CM | POA: Diagnosis not present

## 2016-11-11 DIAGNOSIS — H401111 Primary open-angle glaucoma, right eye, mild stage: Secondary | ICD-10-CM | POA: Diagnosis not present

## 2016-11-11 DIAGNOSIS — H401122 Primary open-angle glaucoma, left eye, moderate stage: Secondary | ICD-10-CM | POA: Diagnosis not present

## 2016-11-11 DIAGNOSIS — H524 Presbyopia: Secondary | ICD-10-CM | POA: Diagnosis not present

## 2016-11-11 DIAGNOSIS — H2513 Age-related nuclear cataract, bilateral: Secondary | ICD-10-CM | POA: Diagnosis not present

## 2016-12-21 ENCOUNTER — Telehealth: Payer: Self-pay | Admitting: Neurology

## 2016-12-21 ENCOUNTER — Other Ambulatory Visit: Payer: Self-pay | Admitting: Neurology

## 2016-12-21 DIAGNOSIS — I671 Cerebral aneurysm, nonruptured: Secondary | ICD-10-CM

## 2016-12-21 NOTE — Telephone Encounter (Signed)
Test order for patient.

## 2016-12-21 NOTE — Telephone Encounter (Signed)
Patient is calling to schedule a CT/Angiogram of her head. She said Dr. Leonie Man usually orders.  I advised Dr. Leonie Man is not in the office but will send message to his nurse.

## 2016-12-21 NOTE — Telephone Encounter (Signed)
Ok I will order it

## 2016-12-21 NOTE — Telephone Encounter (Signed)
Dr.Sethi pt would like yearly Ct angio head, and Ct angio neck per your note.

## 2016-12-28 ENCOUNTER — Ambulatory Visit
Admission: RE | Admit: 2016-12-28 | Discharge: 2016-12-28 | Disposition: A | Discharge status: Home / Self Care | Payer: Medicare Other | Source: Ambulatory Visit | Attending: Neurology | Admitting: Neurology

## 2016-12-28 DIAGNOSIS — I671 Cerebral aneurysm, nonruptured: Secondary | ICD-10-CM

## 2016-12-28 MED ORDER — IOPAMIDOL (ISOVUE-370) INJECTION 76%
75.0000 mL | Freq: Once | INTRAVENOUS | Status: AC | PRN
  Administered 2016-12-28: 75 mL via INTRAVENOUS

## 2017-01-12 ENCOUNTER — Telehealth: Payer: Self-pay

## 2017-01-12 NOTE — Telephone Encounter (Signed)
-----   Message from Garvin Fila, MD sent at 01/01/2017  3:16 PM EST ----- Kindly inform the patient that CT angiogram of the brain shows stable appearance of both her brain aneurysms. No change in size or any worrisome finding

## 2017-01-12 NOTE — Telephone Encounter (Signed)
Notes recorded by Marval Regal, RN on 01/12/2017 at 9:51 AM EST Rn call patient that the Ct angiogram of brain shows stable appearance of both her brain aneurysms. No change in size or worrisome findings. Pt verbalized understanding.

## 2017-02-03 DIAGNOSIS — Z803 Family history of malignant neoplasm of breast: Secondary | ICD-10-CM | POA: Diagnosis not present

## 2017-02-03 DIAGNOSIS — Z1231 Encounter for screening mammogram for malignant neoplasm of breast: Secondary | ICD-10-CM | POA: Diagnosis not present

## 2017-02-11 DIAGNOSIS — M542 Cervicalgia: Secondary | ICD-10-CM | POA: Diagnosis not present

## 2017-02-25 DIAGNOSIS — M25511 Pain in right shoulder: Secondary | ICD-10-CM | POA: Diagnosis not present

## 2017-03-08 DIAGNOSIS — N952 Postmenopausal atrophic vaginitis: Secondary | ICD-10-CM | POA: Diagnosis not present

## 2017-03-08 DIAGNOSIS — Z01411 Encounter for gynecological examination (general) (routine) with abnormal findings: Secondary | ICD-10-CM | POA: Diagnosis not present

## 2017-03-08 DIAGNOSIS — Z124 Encounter for screening for malignant neoplasm of cervix: Secondary | ICD-10-CM | POA: Diagnosis not present

## 2017-03-15 DIAGNOSIS — I10 Essential (primary) hypertension: Secondary | ICD-10-CM | POA: Diagnosis not present

## 2017-03-15 DIAGNOSIS — I639 Cerebral infarction, unspecified: Secondary | ICD-10-CM | POA: Diagnosis not present

## 2017-03-15 DIAGNOSIS — R002 Palpitations: Secondary | ICD-10-CM | POA: Diagnosis not present

## 2017-03-15 DIAGNOSIS — E785 Hyperlipidemia, unspecified: Secondary | ICD-10-CM | POA: Diagnosis not present

## 2017-03-16 DIAGNOSIS — R002 Palpitations: Secondary | ICD-10-CM | POA: Diagnosis not present

## 2017-03-16 DIAGNOSIS — I10 Essential (primary) hypertension: Secondary | ICD-10-CM | POA: Diagnosis not present

## 2017-03-16 DIAGNOSIS — E785 Hyperlipidemia, unspecified: Secondary | ICD-10-CM | POA: Diagnosis not present

## 2017-03-18 DIAGNOSIS — M5030 Other cervical disc degeneration, unspecified cervical region: Secondary | ICD-10-CM | POA: Diagnosis not present

## 2017-03-18 DIAGNOSIS — M25511 Pain in right shoulder: Secondary | ICD-10-CM | POA: Insufficient documentation

## 2017-03-18 DIAGNOSIS — M259 Joint disorder, unspecified: Secondary | ICD-10-CM | POA: Insufficient documentation

## 2017-04-01 DIAGNOSIS — M25511 Pain in right shoulder: Secondary | ICD-10-CM | POA: Diagnosis not present

## 2017-04-08 DIAGNOSIS — M25511 Pain in right shoulder: Secondary | ICD-10-CM | POA: Diagnosis not present

## 2017-04-13 ENCOUNTER — Encounter: Payer: Self-pay | Admitting: Sports Medicine

## 2017-04-13 ENCOUNTER — Ambulatory Visit: Payer: Medicare Other | Admitting: Sports Medicine

## 2017-04-13 ENCOUNTER — Ambulatory Visit: Payer: Medicare Other

## 2017-04-13 VITALS — BP 154/75 | HR 61

## 2017-04-13 DIAGNOSIS — L600 Ingrowing nail: Secondary | ICD-10-CM | POA: Diagnosis not present

## 2017-04-13 DIAGNOSIS — M79674 Pain in right toe(s): Secondary | ICD-10-CM

## 2017-04-13 DIAGNOSIS — E119 Type 2 diabetes mellitus without complications: Secondary | ICD-10-CM

## 2017-04-13 NOTE — Progress Notes (Signed)
Subjective: [name] is a 72 y.o. female patient with history of diabetes who presents to office today complaining of long,mildly painful Right 1st toenail while ambulating in shoes; unable to trim. Patient states that A1C WAS 6.1 AND IS NOT ON MEDS FOR DIABETES. Patient denies any new changes in medication or new problems.  Review of Systems  Musculoskeletal:       Toe pain, right  All other systems reviewed and are negative.    Patient Active Problem List   Diagnosis Date Noted  . Disorder of shoulder 03/18/2017  . Pain in joint of right shoulder 03/18/2017  . TIA (transient ischemic attack) 09/20/2015  . DDD (degenerative disc disease), cervical 09/20/2015  . History of stroke 09/20/2015  . Aneurysm, cerebral, nonruptured 03/28/2015  . Bilateral leg pain 10/26/2011  . Edema of lower extremity 10/26/2011  . Pelvic pain in female 08/25/2011  . Yeast infection   . Trichomonas   . Cystic breast   . Stroke ([place])   . History of chicken pox   . History of measles   . History of mumps    Current Outpatient Medications on File Prior to Visit  Medication Sig Dispense Refill  . acetaminophen (TYLENOL) 500 MG tablet Take 500 mg by mouth every 6 (six) hours as needed for mild pain or moderate pain.     Marland Kitchen ALPRAZolam (XANAX) 1 MG tablet     . atorvastatin (LIPITOR) 20 MG tablet Take 20 mg by mouth daily.    Marland Kitchen dipyridamole-aspirin (AGGRENOX) 200-25 MG 12hr capsule TAKE 1 CAPSULE BY MOUTH 2 (TWO) TIMES DAILY. 180 capsule 4  . latanoprost (XALATAN) 0.005 % ophthalmic solution Place 1 drop into both eyes at bedtime.    . meclizine (ANTIVERT) 12.5 MG tablet Take 1 tablet (12.5 mg total) by mouth 3 (three) times daily as needed for dizziness. 30 tablet 0  . metoprolol (LOPRESSOR) 100 MG tablet     . metoprolol (LOPRESSOR) 50 MG tablet Take 25 mg by mouth 2 (two) times daily.     . Multiple Vitamins-Minerals (MULTIVITAMIN WITH MINERALS) tablet Take 1 tablet by mouth daily.    .  valsartan-hydrochlorothiazide (DIOVAN-HCT) 80-12.5 MG per tablet Take 1 tablet by mouth daily.     No current facility-administered medications on file prior to visit.    Allergies  Allergen Reactions  . Flagyl [Metronidazole] Itching and Swelling    No results found for this or any previous visit (from the past 2160 hour(s)).  Objective: General: Patient is awake, alert, and oriented x 3 and in no acute distress.  Integument: Skin is warm, dry and supple bilateral. Right hallux Nail i tender, long, thickened and dystrophic with subungual debris, consistent with onychomycosis, and ingrowing at medial margin. No signs of infection. All other nails asymptomatic. No open lesions or preulcerative lesions present bilateral. Remaining integument unremarkable.  Vasculature:  Dorsalis Pedis pulse 2/4 bilateral. Posterior Tibial pulse  1/4 bilateral. Capillary fill time <3 sec 1-5 bilateral. Positive hair growth to the level of the digits.Temperature gradient within normal limits. No varicosities present bilateral. No edema present bilateral.   Neurology: The patient has intact sensation measured with a 5.07/10g Semmes Weinstein Monofilament at all pedal sites bilateral. No Babinski sign present bilateral.   Musculoskeletal: Mild right 1st toe pain at nail. No other symptomatic pedal deformities noted bilateral. Muscular strength 5/5 in all lower extremity muscular groups bilateral without pain on range of motion . No tenderness with calf compression bilateral.  Assessment and Plan:  Problem List Items Addressed This Visit    None    Visit Diagnoses    Onychocryptosis    -  Primary   Toe pain, right       Diabetes mellitus without complication ([place])          -Examined patient. -Discussed and educated patient on diabetic foot care, especially with  regards to the vascular, neurological and musculoskeletal systems.  -Stressed the importance of good glycemic control and the detriment of not   controlling glucose levels in relation to the foot. -Mechanically debrided right 1st toenail using sterile nail nipper and filed with dremel without incident  -Recommend tea tree oil and daily filing of nail  -Answered all patient questions -Patient to return as needed for diabetic nail care -Patient advised to call the office if any problems or questions arise in the meantime.  Landis Martins, DPM

## 2017-04-15 DIAGNOSIS — M25511 Pain in right shoulder: Secondary | ICD-10-CM | POA: Diagnosis not present

## 2017-05-20 DIAGNOSIS — E7800 Pure hypercholesterolemia, unspecified: Secondary | ICD-10-CM | POA: Diagnosis not present

## 2017-05-20 DIAGNOSIS — D649 Anemia, unspecified: Secondary | ICD-10-CM | POA: Diagnosis not present

## 2017-05-20 DIAGNOSIS — I10 Essential (primary) hypertension: Secondary | ICD-10-CM | POA: Diagnosis not present

## 2017-05-25 DIAGNOSIS — Z23 Encounter for immunization: Secondary | ICD-10-CM | POA: Diagnosis not present

## 2017-05-25 DIAGNOSIS — E7800 Pure hypercholesterolemia, unspecified: Secondary | ICD-10-CM | POA: Diagnosis not present

## 2017-05-25 DIAGNOSIS — E559 Vitamin D deficiency, unspecified: Secondary | ICD-10-CM | POA: Diagnosis not present

## 2017-05-25 DIAGNOSIS — Z0000 Encounter for general adult medical examination without abnormal findings: Secondary | ICD-10-CM | POA: Diagnosis not present

## 2017-06-10 DIAGNOSIS — I6523 Occlusion and stenosis of bilateral carotid arteries: Secondary | ICD-10-CM | POA: Diagnosis not present

## 2017-06-22 DIAGNOSIS — H401111 Primary open-angle glaucoma, right eye, mild stage: Secondary | ICD-10-CM | POA: Diagnosis not present

## 2017-06-22 DIAGNOSIS — H401122 Primary open-angle glaucoma, left eye, moderate stage: Secondary | ICD-10-CM | POA: Diagnosis not present

## 2017-06-25 DIAGNOSIS — I10 Essential (primary) hypertension: Secondary | ICD-10-CM | POA: Diagnosis not present

## 2017-06-25 DIAGNOSIS — E785 Hyperlipidemia, unspecified: Secondary | ICD-10-CM | POA: Diagnosis not present

## 2017-06-25 DIAGNOSIS — I639 Cerebral infarction, unspecified: Secondary | ICD-10-CM | POA: Diagnosis not present

## 2017-06-25 DIAGNOSIS — R002 Palpitations: Secondary | ICD-10-CM | POA: Diagnosis not present

## 2017-06-29 ENCOUNTER — Other Ambulatory Visit: Payer: Self-pay

## 2017-06-29 ENCOUNTER — Emergency Department (HOSPITAL_COMMUNITY)
Admission: EM | Admit: 2017-06-29 | Discharge: 2017-06-29 | Disposition: A | Discharge status: Home / Self Care | Payer: Medicare Other | Attending: Emergency Medicine | Admitting: Emergency Medicine

## 2017-06-29 ENCOUNTER — Emergency Department (HOSPITAL_COMMUNITY): Payer: Medicare Other

## 2017-06-29 ENCOUNTER — Encounter (HOSPITAL_COMMUNITY): Payer: Self-pay | Admitting: Emergency Medicine

## 2017-06-29 DIAGNOSIS — Z79899 Other long term (current) drug therapy: Secondary | ICD-10-CM | POA: Diagnosis not present

## 2017-06-29 DIAGNOSIS — R202 Paresthesia of skin: Secondary | ICD-10-CM | POA: Insufficient documentation

## 2017-06-29 DIAGNOSIS — I639 Cerebral infarction, unspecified: Secondary | ICD-10-CM | POA: Diagnosis not present

## 2017-06-29 DIAGNOSIS — I10 Essential (primary) hypertension: Secondary | ICD-10-CM | POA: Insufficient documentation

## 2017-06-29 DIAGNOSIS — R29818 Other symptoms and signs involving the nervous system: Secondary | ICD-10-CM | POA: Diagnosis not present

## 2017-06-29 DIAGNOSIS — R200 Anesthesia of skin: Secondary | ICD-10-CM | POA: Diagnosis not present

## 2017-06-29 LAB — COMPREHENSIVE METABOLIC PANEL
ALT: 17 U/L (ref 14–54)
AST: 24 U/L (ref 15–41)
Albumin: 3.6 g/dL (ref 3.5–5.0)
Alkaline Phosphatase: 64 U/L (ref 38–126)
Anion gap: 5 (ref 5–15)
BILIRUBIN TOTAL: 0.5 mg/dL (ref 0.3–1.2)
BUN: 16 mg/dL (ref 6–20)
CHLORIDE: 112 mmol/L — AB (ref 101–111)
CO2: 26 mmol/L (ref 22–32)
Calcium: 9.4 mg/dL (ref 8.9–10.3)
Creatinine, Ser: 0.67 mg/dL (ref 0.44–1.00)
Glucose, Bld: 88 mg/dL (ref 65–99)
POTASSIUM: 3.9 mmol/L (ref 3.5–5.1)
Sodium: 143 mmol/L (ref 135–145)
TOTAL PROTEIN: 6.9 g/dL (ref 6.5–8.1)

## 2017-06-29 LAB — ETHANOL: Alcohol, Ethyl (B): <10 mg/dL (ref <10)

## 2017-06-29 LAB — DIFFERENTIAL
Abs Immature Granulocytes: 0 10*3/uL (ref 0.0–0.1)
BASOS PCT: 0 %
Basophils Absolute: 0 10*3/uL (ref 0.0–0.1)
EOS ABS: 0.1 10*3/uL (ref 0.0–0.7)
Eosinophils Relative: 2 %
Immature Granulocytes: 0 %
LYMPHS ABS: 2.2 10*3/uL (ref 0.7–4.0)
Lymphocytes Relative: 36 %
MONO ABS: 0.6 10*3/uL (ref 0.1–1.0)
MONOS PCT: 10 %
NEUTROS PCT: 52 %
Neutro Abs: 3.1 10*3/uL (ref 1.7–7.7)

## 2017-06-29 LAB — I-STAT CHEM 8, ED
BUN: 16 mg/dL (ref 6–20)
Calcium, Ion: 1.27 mmol/L (ref 1.15–1.40)
Chloride: 108 mmol/L (ref 101–111)
Creatinine, Ser: 0.6 mg/dL (ref 0.44–1.00)
Glucose, Bld: 87 mg/dL (ref 65–99)
HEMATOCRIT: 33 % — AB (ref 36.0–46.0)
Hemoglobin: 11.2 g/dL — ABNORMAL LOW (ref 12.0–15.0)
Potassium: 3.9 mmol/L (ref 3.5–5.1)
SODIUM: 143 mmol/L (ref 135–145)
TCO2: 25 mmol/L (ref 22–32)

## 2017-06-29 LAB — I-STAT TROPONIN, ED: TROPONIN I, POC: 0 ng/mL (ref 0.00–0.08)

## 2017-06-29 LAB — CBC
HCT: 32.5 % — ABNORMAL LOW (ref 36.0–46.0)
Hemoglobin: 10.6 g/dL — ABNORMAL LOW (ref 12.0–15.0)
MCH: 30.7 pg (ref 26.0–34.0)
MCHC: 32.6 g/dL (ref 30.0–36.0)
MCV: 94.2 fL (ref 78.0–100.0)
Platelets: 258 10*3/uL (ref 150–400)
RBC: 3.45 MIL/uL — AB (ref 3.87–5.11)
RDW: 15 % (ref 11.5–15.5)
WBC: 6 10*3/uL (ref 4.0–10.5)

## 2017-06-29 LAB — PROTIME-INR
INR: 1.06
Prothrombin Time: 13.7 seconds (ref 11.4–15.2)

## 2017-06-29 LAB — APTT: aPTT: 28 seconds (ref 24–36)

## 2017-06-29 NOTE — ED Triage Notes (Signed)
45 mins ago was driving and she started to have left t leg and foot tingling in left hand  And under her foot pins and needle feeling, still has tingling in foot but rest  Is getting better, not as pronounced, she drove herself her, has not taken her bp meds today, VAN is neg

## 2017-06-29 NOTE — Consult Note (Signed)
NEUROHOSPITALIST CONSULT NOTE   Requesting Physician: Dr.Pickering    Chief Complaint:  Tingling left hand and left foot  History obtained from:  Patient and chart review  HPI:                                                                                                                                         [name] is an 72 y.o. 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 Code stroke was canceled due to hand/foot involvement without contiguous arm/leg involvement, making it significantly unlikely to be a stroke skipping portions of the motor homunculus to involve hand/foot only. She also has a history of carpal tunnel syndrome involving the left hand and states that she has intermittent numbness and tingling involving that hand; she endorses that the hand portion of her symptoms is consistent with her carpal tunnel syndrome.   Patient states that she woke up this morning feeling fine. She was driving down the road and her left hand and left foot began to tingle. Denies, CP, SOB, weakness, blurred vision, numbness or any right sided symptoms. She drove herself to the emergency room to get checked out today. Patient states that she previously had a TIA manifesting as confusion; she waited 3 days the first time so she did not want to wait today.  CT Head: final read pending Date last known well: Date: 06/29/2017 Time last known well: Time: 12:15 tPA Given: No: risk outweighs benefits            Modified Rankin: Rankin Score=0 NIHSS: 0   Past Medical History:  Diagnosis Date  . Anemia   . Cystic breast   . Cystic breast   . History of chicken pox   . History of measles   . History of mumps   . Hypertension   . Stroke [facility])    TIA  . Trichomonas   . Trichomonas   . Yeast infection     Past Surgical  History:  Procedure Laterality Date  . DILATION AND CURETTAGE OF UTERUS    . excision of fibroadenoma      Family History  Problem Relation Age of Onset  . Cancer Mother   . Cancer Father   . Deep vein thrombosis Sister   . Diabetes Sister   . Hyperlipidemia Sister   . Diabetes Daughter   . Hyperlipidemia Daughter         Social History:  reports that she has never smoked. She has never used smokeless tobacco. She reports that she does not drink alcohol or use drugs.  Allergies:  Allergies  Allergen Reactions  . Flagyl [Metronidazole] Itching and Swelling    Medications:  No current facility-administered medications for this encounter.    Current Outpatient Medications  Medication Sig Dispense Refill  . acetaminophen (TYLENOL) 500 MG tablet Take 500 mg by mouth every 6 (six) hours as needed for mild pain or moderate pain.     Marland Kitchen ALPRAZolam (XANAX) 1 MG tablet     . atorvastatin (LIPITOR) 20 MG tablet Take 20 mg by mouth daily.    Marland Kitchen dipyridamole-aspirin (AGGRENOX) 200-25 MG 12hr capsule TAKE 1 CAPSULE BY MOUTH 2 (TWO) TIMES DAILY. 180 capsule 4  . latanoprost (XALATAN) 0.005 % ophthalmic solution Place 1 drop into both eyes at bedtime.    . meclizine (ANTIVERT) 12.5 MG tablet Take 1 tablet (12.5 mg total) by mouth 3 (three) times daily as needed for dizziness. 30 tablet 0  . metoprolol (LOPRESSOR) 100 MG tablet     . metoprolol (LOPRESSOR) 50 MG tablet Take 25 mg by mouth 2 (two) times daily.     . Multiple Vitamins-Minerals (MULTIVITAMIN WITH MINERALS) tablet Take 1 tablet by mouth daily.    . valsartan-hydrochlorothiazide (DIOVAN-HCT) 80-12.5 MG per tablet Take 1 tablet by mouth daily.       ROS:                                                                                                                                       History obtained from the  patient  General ROS: negative for - chills, fatigue, fever, night sweats, weight gain or weight loss Psychological ROS: negative for - , hallucinations, memory difficulties, mood swings or  Ophthalmic ROS: negative for - blurry vision, double vision, eye pain or loss of vision ENT ROS: negative for - epistaxis, nasal discharge, oral lesions, sore throat, tinnitus or vertigo Respiratory ROS: negative for - cough,  shortness of breath or wheezing Cardiovascular ROS: negative for - chest pain, dyspnea on exertion,  Musculoskeletal ROS: negative for - joint swelling or muscular weakness. Positive for left hand and left foot tingling Neurological ROS: as noted in HPI   General Examination:                                                                                                      Blood pressure (!) 184/74, pulse 62, temperature 98.3 F (36.8 C), temperature source Oral, resp. rate 16, SpO2 100 %.  HEENT-  Normocephalic, no lesions, without obvious abnormality.  Normal external eye and conjunctiva.   Cardiovascular- , pulses palpable throughout   Lungs- no excessive working breathing.  Saturations within normal limits Extremities- Warm, dry and intact Musculoskeletal-no joint tenderness, deformity or swelling. Tingling had subsided during physical exam Skin-warm and dry, no hyperpigmentation, vitiligo, or suspicious lesions  Neurological Examination Mental Status: Alert, oriented, thought content appropriate.  Speech fluent without evidence of aphasia.  Able to follow simple commands. Cranial Nerves: II:  Visual fields grossly normal,  III,IV, VI: Ptosis not present, extra-ocular motions intact bilaterally, PERRL V,VII: smile symmetric, facial light touch sensation normal bilaterally VIII: hearing normal bilaterally IX,X: uvula rises symmetrically XI: bilateral shoulder shrug XII: midline tongue extension Motor: Right : Upper extremity   5/5    Left:     Upper extremity    5/5  Lower extremity   5/5     Lower extremity   5/5 Tone and bulk:normal tone throughout; no atrophy noted Sensory: Pinprick and light touch intact throughout, bilaterally Deep Tendon Reflexes: 2+ and symmetric throughout Plantars: Right: downgoing   Left: downgoing Cerebellar: normal finger-to-nose, normal rapid alternating movements and normal heel-to-shin test Gait: did not test   Lab Results: Basic Metabolic Panel: Recent Labs  Lab 06/29/17 1314  NA 143  K 3.9  CL 108  GLUCOSE 87  BUN 16  CREATININE 0.60    CBC: Recent Labs  Lab 06/29/17 1242 06/29/17 1314  WBC 6.0  --   NEUTROABS 3.1  --   HGB 10.6* 11.2*  HCT 32.5* 33.0*  MCV 94.2  --   PLT 258  --     Lipid Panel: No results for input(s): CHOL, TRIG, HDL, CHOLHDL, VLDL, LDLCALC in the last 168 hours.  CBG: No results for input(s): GLUCAP in the last 168 hours.  Imaging: No results found.  Assessment and plan discussed with with attending physician and they are in agreement.    Laurey Morale, MSN, NP-C [facility] [phone]   06/29/2017, 1:25 PM   Assessment: 72 y.o.  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 Code stroke was canceled due to hand/foot involvement without contiguous arm/leg involvement, making it significantly unlikely to be a stroke skipping portions of the motor homunculus to involve hand/foot only. She also has a history of carpal tunnel syndrome involving the left hand and states that she has intermittent numbness and tingling involving that hand; she endorses that the hand portion of her symptoms is consistent with her carpal tunnel syndrome. TIA possible, but unlikely. ED to continue pursuing other causes.  Stroke Risk Factors - hypertension, previous TIA  Recommendations: --HgbA1c, fasting lipid  panel --MRI/MRA of the brain without contrast. If positive for acute lacunar infarction, obtain remainder of stroke work up.  --PT consult, OT consult, Speech consult --80 mg of Atorvastatin --Prophylactic therapy- Continue Aggrenox --Telemetry monitoring --Frequent neuro checks --NPO until passes stroke swallow screen --please page stroke NP  Or  PA  Or MD from 8am -4 pm  as this patient from this time will be  followed by the stroke.   You can look them up on [email or web address]  Password [email or web address]  Electronically signed: Dr. Kerney Elbe

## 2017-06-29 NOTE — ED Notes (Signed)
Pt states her tingling has resolved

## 2017-06-29 NOTE — Code Documentation (Signed)
72 yo Female coming from work where she noted to have tingling in her hand and left foot that started at 1215. Pt drove herself to the ED. Pt has hx of TIA with complaints of confusion. Pt denies any confusion today, Stroke called in Triage. Stroke Team met patient in hallway on the way to CT. NIHSS 0. Pt reports all the symptoms have resolved. CT Negative and Code Stroke cancelled. Handoff given to [employer], RN

## 2017-06-29 NOTE — ED Notes (Signed)
Pt ambulatory to restroom independently.

## 2017-06-29 NOTE — ED Provider Notes (Signed)
[place] EMERGENCY DEPARTMENT Provider Note   CSN: [id number] Arrival date & time: 06/29/17  1237   An emergency department physician performed an initial assessment on this suspected stroke patient at 1257.  History   Chief Complaint Chief Complaint  Patient presents with  . Numbness    HPI [name] is a 72 y.o. female.  HPI Patient came in for tingling in her left hand and left foot.  Began at 1215 while driving.  States that she is worried because she has had a TIA in the past.  States her hand is completely resolved.  States it was just in the hand and the foot and not in the arm or the lower extremity.  No headache.  No confusion.  Code stroke had been called by nursing and I met her on the way to the CT scanner. Past Medical History:  Diagnosis Date  . Anemia   . Cystic breast   . Cystic breast   . History of chicken pox   . History of measles   . History of mumps   . Hypertension   . Stroke [facility])    TIA  . Trichomonas   . Trichomonas   . Yeast infection     Patient Active Problem List   Diagnosis Date Noted  . Disorder of shoulder 03/18/2017  . Pain in joint of right shoulder 03/18/2017  . TIA (transient ischemic attack) 09/20/2015  . DDD (degenerative disc disease), cervical 09/20/2015  . History of stroke 09/20/2015  . Aneurysm, cerebral, nonruptured 03/28/2015  . Bilateral leg pain 10/26/2011  . Edema of lower extremity 10/26/2011  . Pelvic pain in female 08/25/2011  . Yeast infection   . Trichomonas   . Cystic breast   . Stroke ([place])   . History of chicken pox   . History of measles   . History of mumps     Past Surgical History:  Procedure Laterality Date  . DILATION AND CURETTAGE OF UTERUS    . excision of fibroadenoma       OB History    Gravida  2   Para  2   Term  2   Preterm      AB      Living  2     SAB      TAB      Ectopic      Multiple      Live Births  2            Home  Medications    Prior to Admission medications   Medication Sig Start Date End Date Taking? Authorizing Provider  acetaminophen (TYLENOL) 500 MG tablet Take 500 mg by mouth every 6 (six) hours as needed for mild pain or moderate pain.     [provider]  ALPRAZolam Duanne Moron) 1 MG tablet  11/19/15   [provider]  atorvastatin (LIPITOR) 20 MG tablet Take 20 mg by mouth daily.    [provider]  dipyridamole-aspirin (AGGRENOX) 200-25 MG 12hr capsule TAKE 1 CAPSULE BY MOUTH 2 (TWO) TIMES DAILY. 08/06/16   Garvin Fila, MD  latanoprost (XALATAN) 0.005 % ophthalmic solution Place 1 drop into both eyes at bedtime. 02/25/14   [provider]  meclizine (ANTIVERT) 12.5 MG tablet Take 1 tablet (12.5 mg total) by mouth 3 (three) times daily as needed for dizziness. 08/18/14   Rancour, Annie Main, MD  metoprolol (LOPRESSOR) 100 MG tablet  11/16/15   [provider]  metoprolol (LOPRESSOR) 50 MG tablet Take 25 mg by mouth 2 (two) times daily.     [provider]  Multiple Vitamins-Minerals (MULTIVITAMIN WITH MINERALS) tablet Take 1 tablet by mouth daily.    [provider]  valsartan-hydrochlorothiazide (DIOVAN-HCT) 80-12.5 MG per tablet Take 1 tablet by mouth daily.    [provider]    Family History Family History  Problem Relation Age of Onset  . Cancer Mother   . Cancer Father   . Deep vein thrombosis Sister   . Diabetes Sister   . Hyperlipidemia Sister   . Diabetes Daughter   . Hyperlipidemia Daughter     Social History Social History   Tobacco Use  . Smoking status: Never Smoker  . Smokeless tobacco: Never Used  Substance Use Topics  . Alcohol use: No    Alcohol/week: 0.0 oz  . Drug use: No     Allergies   Flagyl [metronidazole]   Review of Systems Review of Systems  Constitutional: Negative for appetite change.  HENT: Negative for congestion.   Eyes: Negative for visual disturbance.  Respiratory:  Negative for shortness of breath.   Cardiovascular: Negative for chest pain.  Gastrointestinal: Negative for abdominal pain.  Genitourinary: Negative for flank pain.  Musculoskeletal: Negative for back pain.  Skin: Negative for rash.  Neurological: Positive for numbness.  Hematological: Negative for adenopathy.  Psychiatric/Behavioral: Negative for confusion.     Physical Exam Updated Vital Signs BP (!) 157/75   Pulse 61   Temp 98.3 F (36.8 C) (Oral)   Resp 16   SpO2 100%   Physical Exam  Constitutional: She appears well-developed.  HENT:  Head: Atraumatic.  Eyes: Pupils are equal, round, and reactive to light.  Neck: Neck supple.  Cardiovascular: Normal rate.  Pulmonary/Chest: Effort normal.  Abdominal: Soft.  Neurological: She is alert. No cranial nerve deficit. Coordination normal.  Sensation intact over hands and feet.  No paresthesias.  Face symmetric.  Good grip strength bilaterally.  Complete NIH scoring done by neurology and score was 0.  Skin: Skin is warm. Capillary refill takes less than 2 seconds.     ED Treatments / Results  Labs (all labs ordered are listed, but only abnormal results are displayed) Labs Reviewed  CBC - Abnormal; Notable for the following components:      Result Value   RBC 3.45 (*)    Hemoglobin 10.6 (*)    HCT 32.5 (*)    All other components within normal limits  COMPREHENSIVE METABOLIC PANEL - Abnormal; Notable for the following components:   Chloride 112 (*)    All other components within normal limits  I-STAT CHEM 8, ED - Abnormal; Notable for the following components:   Hemoglobin 11.2 (*)    HCT 33.0 (*)    All other components within normal limits  ETHANOL  PROTIME-INR  APTT  DIFFERENTIAL  I-STAT TROPONIN, ED    EKG EKG Interpretation  Date/Time:  Tuesday June 29 2017 13:29:46 EDT Ventricular Rate:  56 PR Interval:    QRS Duration: 98 QT Interval:  417 QTC Calculation: 403 R Axis:   25 Text Interpretation:   Sinus rhythm Probable left atrial enlargement Abnormal R-wave progression, early transition Confirmed by Davonna Belling [phone]) on 06/29/2017 3:04:57 PM   Radiology Ct Head Code Stroke Wo Contrast  Result Date: 06/29/2017 CLINICAL DATA:  Code stroke. LEFT hand and LEFT foot numbness while driving. History of TIA and known cerebral aneurysm. EXAM: CT HEAD WITHOUT CONTRAST TECHNIQUE: Contiguous  axial images were obtained from the base of the skull through the vertex without intravenous contrast. COMPARISON:  CTA head neck 12/28/2016.  MR head and MRA 12/02/2015. FINDINGS: Brain: No evidence for acute infarction, hemorrhage, mass lesion, hydrocephalus, or extra-axial fluid. Mild atrophy. Hypoattenuation of white matter, consistent with small vessel disease. Chronic lacunar infarct LEFT centrum semiovale. Vascular: Calcification of the cavernous internal carotid arteries consistent with cerebrovascular atherosclerotic disease. No signs of intracranial large vessel occlusion. Skull: Calvarium intact. Sinuses/Orbits: Sinuses are clear. No orbital findings of significance. Other: None. ASPECTS [facility] Stroke Program Early CT Score) - Ganglionic level infarction (caudate, lentiform nuclei, internal capsule, insula, M1-M3 cortex): 7 - Supraganglionic infarction (M4-M6 cortex): 3 Total score (0-10 with 10 being normal): 10 IMPRESSION: 1. Mild atrophy and small vessel disease. Old LEFT centrum semiovale lacunar infarct. No acute findings. 2. ASPECTS is 10. These results were communicated to Dr. Cheral Marker at [facility] 6/4/2019by text page via the [facility] messaging system. Electronically Signed   By: Staci Righter M.D.   On: 06/29/2017 13:35    Procedures Procedures (including critical care time)  Medications Ordered in ED Medications - No data to display   Initial Impression / Assessment and Plan / ED Course  I have reviewed the triage vital signs and the nursing notes.  Pertinent labs & imaging results that  were available during my care of the patient were reviewed by me and considered in my medical decision making (see chart for details).     Patient with acute onset of paresthesias.  Resolved upon arrival.  With the distribution doubt TIA or stroke as the cause.  Head CT reassuring and seen by neurology.  Discharged home to follow-up with PCP.  Final Clinical Impressions(s) / ED Diagnoses  Paresthesia    ED Discharge Orders    None       Davonna Belling, MD 06/29/17 1505

## 2017-06-29 NOTE — ED Provider Notes (Signed)
Patient placed in Quick Look pathway, seen and evaluated   Chief Complaint: numbness in left hand and leg.   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 Patient reports that she was in a cold room earlier and after she left and was driving home she felt tingling on the left side of her body. She thought at first maybe it was from the cold room but then thought "why would it just be on one side". Patient reports having had a TIA in the past and she didn't come in until 2 days later when she started drooling. She reports that this time she does not want to take a chance.   ROS: Neuro: numbness and tingling left hand and foot  Physical Exam:   Gen: No distress  Neuro: Awake and Alert, no pronator drift, grips are equal, steady gait, no facial weakness.   Skin: Warm and dry     Initiation of care has begun. The patient has been counseled on the process, plan, and necessity for staying for the completion/evaluation, and the remainder of the medical screening examination    Ashley Murrain, NP 06/29/17 1301    Carmin Muskrat, MD 07/03/17 0002

## 2017-06-29 NOTE — ED Notes (Signed)
[facility] called, spoke with Marden Noble, to call a code stroke per charge nurse [place]. LSN 1215 symptoms left leg and foot tingling

## 2017-07-02 DIAGNOSIS — I639 Cerebral infarction, unspecified: Secondary | ICD-10-CM | POA: Diagnosis not present

## 2017-07-02 DIAGNOSIS — I10 Essential (primary) hypertension: Secondary | ICD-10-CM | POA: Diagnosis not present

## 2017-07-02 DIAGNOSIS — E785 Hyperlipidemia, unspecified: Secondary | ICD-10-CM | POA: Diagnosis not present

## 2017-07-02 DIAGNOSIS — R002 Palpitations: Secondary | ICD-10-CM | POA: Diagnosis not present

## 2017-07-09 DIAGNOSIS — I10 Essential (primary) hypertension: Secondary | ICD-10-CM | POA: Diagnosis not present

## 2017-07-09 DIAGNOSIS — E785 Hyperlipidemia, unspecified: Secondary | ICD-10-CM | POA: Diagnosis not present

## 2017-07-22 DIAGNOSIS — Z1211 Encounter for screening for malignant neoplasm of colon: Secondary | ICD-10-CM | POA: Diagnosis not present

## 2017-07-22 DIAGNOSIS — R140 Abdominal distension (gaseous): Secondary | ICD-10-CM | POA: Diagnosis not present

## 2017-07-22 DIAGNOSIS — Z86010 Personal history of colonic polyps: Secondary | ICD-10-CM | POA: Diagnosis not present

## 2017-09-03 ENCOUNTER — Other Ambulatory Visit: Payer: Self-pay | Admitting: Neurology

## 2017-09-06 ENCOUNTER — Other Ambulatory Visit: Payer: Self-pay

## 2017-09-06 ENCOUNTER — Telehealth: Payer: Self-pay | Admitting: Neurology

## 2017-09-06 MED ORDER — ASPIRIN-DIPYRIDAMOLE ER 25-200 MG PO CP12: ORAL_CAPSULE | ORAL | 4 refills | Status: DC

## 2017-09-06 NOTE — Telephone Encounter (Signed)
[facility] sent [facility] per patients request .Refill came from retail pharmacy.

## 2017-09-06 NOTE — Telephone Encounter (Signed)
Pt called stating she is needing her Rx for dipyridamole-aspirin (AGGRENOX) 200-25 MG 12hr capsule needs to go through [facility].

## 2017-09-07 ENCOUNTER — Other Ambulatory Visit: Payer: Self-pay | Admitting: Neurology

## 2017-09-09 ENCOUNTER — Other Ambulatory Visit: Payer: Self-pay

## 2017-09-09 ENCOUNTER — Other Ambulatory Visit: Payer: Self-pay | Admitting: Neurology

## 2017-09-09 ENCOUNTER — Telehealth: Payer: Self-pay | Admitting: Neurology

## 2017-09-09 MED ORDER — ASPIRIN-DIPYRIDAMOLE ER 25-200 MG PO CP12: ORAL_CAPSULE | ORAL | 0 refills | Status: DC

## 2017-09-09 NOTE — Telephone Encounter (Signed)
Rn went to speak with patient about her getting aspirin/aggrenox to [facility]. Rn stated to patient at front desk that she call 09/06/2017 and requested it be [facility] to [facility]. Pt stated she found out for the next 3 months the medication will be cheaper at [facility]. Rn stated [facility] will [facility] to [facility] for 3 month supply. PT will call back before its expires to let us know where she wants the medication to be filled ongoing.

## 2017-09-09 NOTE — Telephone Encounter (Signed)
Pt stopped by the office to request a paper RX for AGGRENOX 200-25 MG 12hr cap [facility]. She had previously requested script to be sent to [facility] but they told her that the price was higher than [facility] it was [facility] so now [facility] would [facility] for the remaining of the year and then have it sent back to [facility] in the start of 2020.

## 2017-09-09 NOTE — Telephone Encounter (Signed)
3 month supply [facility]/aggrenox for 90 days per her request.

## 2017-10-05 DIAGNOSIS — R1011 Right upper quadrant pain: Secondary | ICD-10-CM | POA: Diagnosis not present

## 2017-10-05 DIAGNOSIS — Z86010 Personal history of colonic polyps: Secondary | ICD-10-CM | POA: Diagnosis not present

## 2017-10-05 DIAGNOSIS — M940 Chondrocostal junction syndrome [Tietze]: Secondary | ICD-10-CM | POA: Diagnosis not present

## 2017-10-07 DIAGNOSIS — R002 Palpitations: Secondary | ICD-10-CM | POA: Diagnosis not present

## 2017-10-08 DIAGNOSIS — Z8673 Personal history of transient ischemic attack (TIA), and cerebral infarction without residual deficits: Secondary | ICD-10-CM | POA: Diagnosis not present

## 2017-10-08 DIAGNOSIS — E785 Hyperlipidemia, unspecified: Secondary | ICD-10-CM | POA: Diagnosis not present

## 2017-10-08 DIAGNOSIS — I10 Essential (primary) hypertension: Secondary | ICD-10-CM | POA: Diagnosis not present

## 2017-10-08 DIAGNOSIS — R002 Palpitations: Secondary | ICD-10-CM | POA: Diagnosis not present

## 2017-10-08 DIAGNOSIS — M1990 Unspecified osteoarthritis, unspecified site: Secondary | ICD-10-CM | POA: Diagnosis not present

## 2017-10-12 DIAGNOSIS — Z8673 Personal history of transient ischemic attack (TIA), and cerebral infarction without residual deficits: Secondary | ICD-10-CM | POA: Diagnosis not present

## 2017-10-12 DIAGNOSIS — M1990 Unspecified osteoarthritis, unspecified site: Secondary | ICD-10-CM | POA: Diagnosis not present

## 2017-10-12 DIAGNOSIS — E785 Hyperlipidemia, unspecified: Secondary | ICD-10-CM | POA: Diagnosis not present

## 2017-10-12 DIAGNOSIS — I10 Essential (primary) hypertension: Secondary | ICD-10-CM | POA: Diagnosis not present

## 2017-10-12 DIAGNOSIS — R002 Palpitations: Secondary | ICD-10-CM | POA: Diagnosis not present

## 2017-10-20 DIAGNOSIS — R0781 Pleurodynia: Secondary | ICD-10-CM | POA: Diagnosis not present

## 2017-10-20 DIAGNOSIS — I517 Cardiomegaly: Secondary | ICD-10-CM | POA: Diagnosis not present

## 2017-10-20 DIAGNOSIS — M545 Low back pain: Secondary | ICD-10-CM | POA: Diagnosis not present

## 2017-10-20 DIAGNOSIS — M5136 Other intervertebral disc degeneration, lumbar region: Secondary | ICD-10-CM | POA: Diagnosis not present

## 2017-10-20 DIAGNOSIS — R1011 Right upper quadrant pain: Secondary | ICD-10-CM | POA: Diagnosis not present

## 2017-10-21 DIAGNOSIS — N200 Calculus of kidney: Secondary | ICD-10-CM | POA: Diagnosis not present

## 2017-10-21 DIAGNOSIS — R1011 Right upper quadrant pain: Secondary | ICD-10-CM | POA: Diagnosis not present

## 2017-10-21 DIAGNOSIS — N281 Cyst of kidney, acquired: Secondary | ICD-10-CM | POA: Diagnosis not present

## 2017-10-26 DIAGNOSIS — R0781 Pleurodynia: Secondary | ICD-10-CM | POA: Diagnosis not present

## 2017-11-08 DIAGNOSIS — M542 Cervicalgia: Secondary | ICD-10-CM | POA: Diagnosis not present

## 2017-11-08 DIAGNOSIS — M5030 Other cervical disc degeneration, unspecified cervical region: Secondary | ICD-10-CM | POA: Diagnosis not present

## 2017-11-08 DIAGNOSIS — R1011 Right upper quadrant pain: Secondary | ICD-10-CM | POA: Diagnosis not present

## 2017-11-08 DIAGNOSIS — M25511 Pain in right shoulder: Secondary | ICD-10-CM | POA: Diagnosis not present

## 2017-11-12 DIAGNOSIS — J069 Acute upper respiratory infection, unspecified: Secondary | ICD-10-CM | POA: Diagnosis not present

## 2017-11-12 DIAGNOSIS — R05 Cough: Secondary | ICD-10-CM | POA: Diagnosis not present

## 2017-11-22 ENCOUNTER — Other Ambulatory Visit: Payer: Self-pay | Admitting: Neurology

## 2017-11-29 ENCOUNTER — Encounter: Payer: Self-pay | Admitting: Neurology

## 2017-11-29 ENCOUNTER — Telehealth: Payer: Self-pay | Admitting: Neurology

## 2017-11-29 ENCOUNTER — Ambulatory Visit: Payer: Medicare Other | Admitting: Neurology

## 2017-11-29 ENCOUNTER — Other Ambulatory Visit: Payer: Self-pay

## 2017-11-29 VITALS — BP 140/78 | HR 65 | Wt 166.8 lb

## 2017-11-29 DIAGNOSIS — I671 Cerebral aneurysm, nonruptured: Secondary | ICD-10-CM

## 2017-11-29 MED ORDER — ASPIRIN-DIPYRIDAMOLE ER 25-200 MG PO CP12: ORAL_CAPSULE | ORAL | 1 refills | Status: DC

## 2017-11-29 NOTE — Patient Instructions (Signed)
I had a long d/w patient about her remote stroke, asymptomatic cerebral aneurysms,risk for recurrent stroke/TIAs, personally independently reviewed imaging studies and stroke evaluation results and answered questions.Continue Aggrenox for secondary stroke prevention and maintain strict control of hypertension with blood pressure goal below 130/90, diabetes with hemoglobin A1c goal below 6.5% and lipids with LDL cholesterol goal below 70 mg/dL. I also advised the patient to eat a healthy diet with plenty of whole grains, cereals, fruits and vegetables, exercise regularly and maintain ideal body weight . Check yearly f/u Ct angiogram for her asymptomatic cerebral aneurysms in Dec 2019.Followup in the future with me in 1-2 years or call earlier if necessary

## 2017-11-29 NOTE — Telephone Encounter (Signed)
[employer] medicare order sent to [facility]. They will reach out to the pt to schedule.

## 2017-11-29 NOTE — Telephone Encounter (Signed)
Rn call patient about wanting the dipyridamole aspirin([facility]ox) sent [facility]. She stated the [facility]. [facility] to see if they have a cheaper rate. Pt will call back [facility]. [facility] rx [facility]. Pt verbalized understanding.

## 2017-11-29 NOTE — Telephone Encounter (Signed)
Patient is aware of this she has [facility] number I told her to give them a call if she has not heard in the next 2-3 business days.

## 2017-11-29 NOTE — Progress Notes (Signed)
STROKE NEUROLOGY FOLLOW UP NOTE  NAME: [name] DOB: [birth year]  REASON FOR VISIT: stroke follow up HISTORY FROM: pt and chart  Today we had the pleasure of seeing [name] in follow-up at our [facility]rology [facility]. Pt was accompanied by no one.   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   She stated that she had stroke in 04/2005 but no residue and followed with Dr. Leonie Man in clinic, last seen 03/2015. For the last 6 months to a year, she started to have right sided neck pain, stiffness, with right shoulder and lateral arm pain with mildly limited ROM, with radiating pain to the forearm and right palm. Comes and goes, sometimes can be intense, but other time dull achy. It can happen every the other day. She also has LBP comes and goes, sometimes hard to get out of the car due to LBP.  However, last Monday, she had right side facial numbness, at the left cheek area, feels like norvacaine shot at dental office. The numbness lasted about one day and resolved. She denies any HA, weakness, speech or swallow difficulty, no arm/leg issues at that time. She is compliant with aggrenox and lipitor.  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 Patient has previously met with Dr. Estanislado Pandy three times to discuss endovascular treatment and chosen conservative monitoring and follow up.  She had HTN and stable taking meds, today BP 129/66. She denies DM and she check her glucose at home was 115-130.    Update 11/21/2015 : She returns for follow-up after last visit with Dr. showed 2 months  ago. She states she's not had any further episodes of numbness on cheek. She feels muscle tightness in the back of her head and neck but denies significant radicular pain. She did have an episode of her right hand fingers locking up after and morning when she was taking taken pending her wrist a lot in the kitchen. She did not undergo MRI scan the brain and cervical spine has been never got a call to schedule that but have now been scheduled for 12/02/15. Similarly CT angiogram of the brain for follow-up of aneurysms is also scheduled for the same day. Patient was seen by gastroenterologist Dr. Collene Mares today for rectal bleeding and is scheduled to undergo colonoscopy in December. She is concerned about risk of stroke and stopping Aggrenox for 5 days prior to the procedure.  Update 05/05/2016 : She returns for follow-up after last visit 6 months ago. She continues to do well without recurrent stroke or TIA symptoms. She had follow-up CT angiogram of the brain and neck done on 12/02/15 which showed stable appearance of her intracranial aneurysms. Patient states his started walking regularly and has lost 20 pounds. She occasionally hears her heartbeat in the ears particularly at night when she is lying down quietly. She denies significant stress. She is also noticed some intermittent numbness in the lower lateral aspect of the left foot. She does agree that she sleeps on her left side a lot. She denies significant back pain or radicular  pain. She has not had any recurrent stroke or TIA symptoms. She states her blood pressure is well controlled today it is 120/63. Update 11/29/2017 : She returns for follow-up after last visit a year ago.  She continues to do well without definite stroke or TIA symptoms now for greater than 10 years.  She was however seen in the emergency room on 06/29/2017 and she woke up with tingling in the left hand as well as left foot.  She was seen by neurologist Dr. Cheral Marker who felt her symptoms are  not comparable with stroke.  She did have a prior history of carpal tunnel and hand numbness seems similar to this.  I had previously seen her for left foot numbness which I thought was related to peroneal entrapment neuropathy.  Patient had a CT scan of the head done which showed no acute abnormality.  Patient states the symptoms have not recurred since then.  She remains on Aggrenox which is tolerating well without side effects.  Her blood pressure fluctuates a bit at times it is in the low 110 range but today it is 140/78.  She takes metoprolol 25 mg daily.  She states her lipid profile is well controlled and she is on Lipitor 20 and Zetia.  She does follow-up regularly with a primary physician.  She had CT angiogram of the brain done on 12/28/2016 which showed stable appearance of her intracranial aneurysms.  She is due for a follow-up CTA next month. REVIEW OF SYSTEMS: Full 14 system review of systems performed and notable only for those listed below and in HPI above, all others are negative:    tingling in the hand numbness in the leg all othersystems negative The following represents the patient's updated allergies and side effects list: Allergies  Allergen Reactions  . Flagyl [Metronidazole] Itching and Swelling    The neurologically relevant items on the patient's problem list were reviewed on today's visit.  Neurologic Examination  A problem focused neurological exam (12 or more points of the single system neurologic examination, vital signs counts as 1 point, cranial nerves count for 8 points) was performed.  Blood pressure 140/78, pulse 65, weight 166 lb 12.8 oz (75.7 kg).  General - Well nourished, well developed, in no apparent distress.  Ophthalmologic - funduscopic exam not done   Cardiovascular - Regular rate and rhythm with no murmur.  Mental Status -  Level of arousal and orientation to time, place, and person were intact. Language including expression, naming, repetition,  comprehension was assessed and found intact. Fund of Knowledge was assessed and was intact.  Cranial Nerves II - XII - II - Visual field intact OU. III, IV, VI - Extraocular movements intact. V - Facial sensation intact bilaterally. VII - Facial movement intact bilaterally. VIII - Hearing & vestibular intact bilaterally. X - Palate elevates symmetrically. XI - Chin turning & shoulder shrug intact bilaterally. XII - Tongue protrusion intact.  Motor Strength - The patient's strength was normal in all extremities and pronator drift was absent.  Bulk was normal and fasciculations were absent.   Motor Tone - Muscle tone was assessed at the neck and appendages and was normal.  Reflexes - The patient's reflexes were 1+ in all extremities and she had no pathological reflexes.  Sensory - Light touch, temperature/pinprick were assessed and were normal.    Coordination - The patient had normal movements in the hands and feet with no ataxia or dysmetria.  Tremor was absent.  Gait and Station -  The patient's transfers, posture, gait, station, and turns were observed as normal.  Data reviewed: I personally reviewed the images and agree with the radiology interpretations.  CT angio brain and neck 12/02/2015 :  1. 8 mm left cavernous ICA aneurysm is unchanged compared to 08/18/2014. 2. 2-3 mm right ophthalmic ICA aneurysm is unchanged from prior. 3. 1-2 mm outpouching from the supraclinoid left ICA is stable and favors infundibulum. CT angio head and neck 12/28/2016 :  1. Unchanged 8 mm left cavernous and 3 mm right paraophthalmic ICA Aneurysms. 2. Unchanged 1-2 mm left supraclinoid ICA outpouching suggesting an infundibulum. Ct Head 06/29/2017 : Mild atrophy and small vessel disease. Old LEFT centrum semiovale lacunar infarct. No acute findings. Assessment:   she is a 72 y.o. 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  Transient episode of left hand and feet paresthesias of unclear etiology in June 2019.  History of mild carpal tunnel syndrome and peroneal neuropathy which appears stable Plan:  I had a long d/w patient about her remote stroke, asymptomatic cerebral aneurysms,risk for recurrent stroke/TIAs, personally independently reviewed imaging studies and stroke evaluation results and answered questions.Continue Aggrenox for secondary stroke prevention and maintain strict control of hypertension with blood pressure goal below 130/90, diabetes with hemoglobin A1c goal below 6.5% and lipids with LDL cholesterol goal below 70 mg/dL. I also advised the patient to eat a healthy diet with plenty of whole grains, cereals, fruits and vegetables, exercise regularly and maintain ideal body weight . Check yearly f/u Ct angiogram for her asymptomatic cerebral aneurysms in Dec 2019.Followup in the future with me in 1-2 years or call earlier if necessary Greater than 50% time during this 25 minute visit was spent on counseling and coordination of care about her asymptomatic cerebral aneurysms, stroke and TIA risk, left foot peroneal neuropathy and answering questions  . Antony Contras, MD  [facility] [address] [place], [place] [address]  Phone [phone] Fax [phone]  Antony Contras, MD [facility] [address], [place], [place] [address] [phone]

## 2017-11-29 NOTE — Telephone Encounter (Signed)
Patient requesting Rx for dipyridamole-aspirin (AGGRENOX) 200-25 MG 12hr capsule (generic)  sent to [facility]. [facility] but patient wants Rx sent to [place] to see which would be cheaper.

## 2017-12-03 ENCOUNTER — Ambulatory Visit
Admission: RE | Admit: 2017-12-03 | Discharge: 2017-12-03 | Disposition: A | Discharge status: Home / Self Care | Payer: Medicare Other | Source: Ambulatory Visit | Attending: Neurology | Admitting: Neurology

## 2017-12-03 DIAGNOSIS — I720 Aneurysm of carotid artery: Secondary | ICD-10-CM | POA: Diagnosis not present

## 2017-12-03 DIAGNOSIS — I671 Cerebral aneurysm, nonruptured: Secondary | ICD-10-CM

## 2017-12-03 MED ORDER — IOPAMIDOL (ISOVUE-370) INJECTION 76%
75.0000 mL | Freq: Once | INTRAVENOUS | Status: AC | PRN
  Administered 2017-12-03: 75 mL via INTRAVENOUS

## 2017-12-13 ENCOUNTER — Telehealth: Payer: Self-pay | Admitting: Neurology

## 2017-12-13 NOTE — Telephone Encounter (Signed)
I called the patient and answered her questions about her right PCA stenosis.  I recommend she continue taking her aspirin /dipyradimole for now and maintain aggressive risk factor modification with blood pressure goal below 130/90, lipids with LDL cholesterol goal below 70 mg percent and diabetes with hemoglobin A1c goal below 6.5%.  She was also advised to eat a healthy diet and be active.  She voiced understanding.

## 2017-12-13 NOTE — Telephone Encounter (Signed)
Pt is asking that RN Katrina has Dr Leonie Man to call her so that she can discuss the results of her CTA.  Pt is asking that if Dr Leonie Man can no call her before 5:30 today that he just calls her sometime tomorrow.

## 2017-12-21 DIAGNOSIS — H401122 Primary open-angle glaucoma, left eye, moderate stage: Secondary | ICD-10-CM | POA: Diagnosis not present

## 2017-12-21 DIAGNOSIS — H2513 Age-related nuclear cataract, bilateral: Secondary | ICD-10-CM | POA: Diagnosis not present

## 2017-12-21 DIAGNOSIS — H25013 Cortical age-related cataract, bilateral: Secondary | ICD-10-CM | POA: Diagnosis not present

## 2017-12-21 DIAGNOSIS — H524 Presbyopia: Secondary | ICD-10-CM | POA: Diagnosis not present

## 2017-12-27 DIAGNOSIS — K635 Polyp of colon: Secondary | ICD-10-CM | POA: Diagnosis not present

## 2017-12-27 DIAGNOSIS — Z86010 Personal history of colonic polyps: Secondary | ICD-10-CM | POA: Diagnosis not present

## 2017-12-27 DIAGNOSIS — D123 Benign neoplasm of transverse colon: Secondary | ICD-10-CM | POA: Diagnosis not present

## 2017-12-27 DIAGNOSIS — Z1211 Encounter for screening for malignant neoplasm of colon: Secondary | ICD-10-CM | POA: Diagnosis not present

## 2017-12-29 ENCOUNTER — Telehealth: Payer: Self-pay

## 2017-12-29 NOTE — Telephone Encounter (Signed)
Clearance form fax to [facility] at [phone]. Form fax twice and confirmed. 

## 2018-01-24 ENCOUNTER — Encounter (HOSPITAL_COMMUNITY): Payer: Self-pay

## 2018-01-24 ENCOUNTER — Emergency Department (HOSPITAL_COMMUNITY): Payer: Medicare Other

## 2018-01-24 ENCOUNTER — Inpatient Hospital Stay (HOSPITAL_COMMUNITY)
Admission: EM | Admit: 2018-01-24 | Discharge: 2018-01-25 | DRG: 066 | Disposition: A | Discharge status: Home / Self Care | Payer: Medicare Other | Attending: Internal Medicine | Admitting: Internal Medicine

## 2018-01-24 DIAGNOSIS — E785 Hyperlipidemia, unspecified: Secondary | ICD-10-CM | POA: Diagnosis present

## 2018-01-24 DIAGNOSIS — R29898 Other symptoms and signs involving the musculoskeletal system: Secondary | ICD-10-CM

## 2018-01-24 DIAGNOSIS — G8321 Monoplegia of upper limb affecting right dominant side: Secondary | ICD-10-CM | POA: Diagnosis not present

## 2018-01-24 DIAGNOSIS — I639 Cerebral infarction, unspecified: Secondary | ICD-10-CM | POA: Diagnosis present

## 2018-01-24 DIAGNOSIS — I671 Cerebral aneurysm, nonruptured: Secondary | ICD-10-CM | POA: Diagnosis not present

## 2018-01-24 DIAGNOSIS — I739 Peripheral vascular disease, unspecified: Secondary | ICD-10-CM | POA: Diagnosis present

## 2018-01-24 DIAGNOSIS — F419 Anxiety disorder, unspecified: Secondary | ICD-10-CM | POA: Diagnosis present

## 2018-01-24 DIAGNOSIS — Z79899 Other long term (current) drug therapy: Secondary | ICD-10-CM | POA: Diagnosis not present

## 2018-01-24 DIAGNOSIS — Z7982 Long term (current) use of aspirin: Secondary | ICD-10-CM | POA: Diagnosis not present

## 2018-01-24 DIAGNOSIS — I6381 Other cerebral infarction due to occlusion or stenosis of small artery: Principal | ICD-10-CM | POA: Diagnosis present

## 2018-01-24 DIAGNOSIS — R200 Anesthesia of skin: Secondary | ICD-10-CM | POA: Diagnosis not present

## 2018-01-24 DIAGNOSIS — Z881 Allergy status to other antibiotic agents status: Secondary | ICD-10-CM | POA: Diagnosis not present

## 2018-01-24 DIAGNOSIS — R42 Dizziness and giddiness: Secondary | ICD-10-CM | POA: Diagnosis not present

## 2018-01-24 DIAGNOSIS — I63332 Cerebral infarction due to thrombosis of left posterior cerebral artery: Secondary | ICD-10-CM | POA: Diagnosis not present

## 2018-01-24 DIAGNOSIS — R7303 Prediabetes: Secondary | ICD-10-CM | POA: Diagnosis not present

## 2018-01-24 DIAGNOSIS — I10 Essential (primary) hypertension: Secondary | ICD-10-CM | POA: Diagnosis present

## 2018-01-24 DIAGNOSIS — R531 Weakness: Secondary | ICD-10-CM | POA: Diagnosis not present

## 2018-01-24 DIAGNOSIS — R29700 NIHSS score 0: Secondary | ICD-10-CM | POA: Diagnosis not present

## 2018-01-24 LAB — I-STAT CHEM 8, ED
BUN: 12 mg/dL (ref 8–23)
Calcium, Ion: 1.25 mmol/L (ref 1.15–1.40)
Chloride: 107 mmol/L (ref 98–111)
Creatinine, Ser: 0.6 mg/dL (ref 0.44–1.00)
Glucose, Bld: 111 mg/dL — ABNORMAL HIGH (ref 70–99)
HEMATOCRIT: 38 % (ref 36.0–46.0)
Hemoglobin: 12.9 g/dL (ref 12.0–15.0)
Potassium: 3.7 mmol/L (ref 3.5–5.1)
Sodium: 140 mmol/L (ref 135–145)
TCO2: 27 mmol/L (ref 22–32)

## 2018-01-24 LAB — COMPREHENSIVE METABOLIC PANEL
ALT: 17 U/L (ref 0–44)
AST: 26 U/L (ref 15–41)
Albumin: 3.8 g/dL (ref 3.5–5.0)
Alkaline Phosphatase: 60 U/L (ref 38–126)
Anion gap: 10 (ref 5–15)
BUN: 10 mg/dL (ref 8–23)
CHLORIDE: 107 mmol/L (ref 98–111)
CO2: 23 mmol/L (ref 22–32)
Calcium: 9.3 mg/dL (ref 8.9–10.3)
Creatinine, Ser: 0.73 mg/dL (ref 0.44–1.00)
GFR calc Af Amer: >60 mL/min (ref >60)
GFR calc non Af Amer: >60 mL/min (ref >60)
Glucose, Bld: 113 mg/dL — ABNORMAL HIGH (ref 70–99)
POTASSIUM: 3.8 mmol/L (ref 3.5–5.1)
Sodium: 140 mmol/L (ref 135–145)
Total Bilirubin: 0.7 mg/dL (ref 0.3–1.2)
Total Protein: 7.2 g/dL (ref 6.5–8.1)

## 2018-01-24 LAB — DIFFERENTIAL
ABS IMMATURE GRANULOCYTES: 0.01 10*3/uL (ref 0.00–0.07)
BASOS ABS: 0 10*3/uL (ref 0.0–0.1)
Basophils Relative: 1 %
Eosinophils Absolute: 0 10*3/uL (ref 0.0–0.5)
Eosinophils Relative: 1 %
Immature Granulocytes: 0 %
Lymphocytes Relative: 27 %
Lymphs Abs: 1.1 10*3/uL (ref 0.7–4.0)
Monocytes Absolute: 0.5 10*3/uL (ref 0.1–1.0)
Monocytes Relative: 12 %
Neutro Abs: 2.3 10*3/uL (ref 1.7–7.7)
Neutrophils Relative %: 59 %

## 2018-01-24 LAB — APTT: aPTT: 29 seconds (ref 24–36)

## 2018-01-24 LAB — CBC
HCT: 38.5 % (ref 36.0–46.0)
Hemoglobin: 12.1 g/dL (ref 12.0–15.0)
MCH: 30 pg (ref 26.0–34.0)
MCHC: 31.4 g/dL (ref 30.0–36.0)
MCV: 95.5 fL (ref 80.0–100.0)
Platelets: 265 10*3/uL (ref 150–400)
RBC: 4.03 MIL/uL (ref 3.87–5.11)
RDW: 15.4 % (ref 11.5–15.5)
WBC: 3.9 10*3/uL — ABNORMAL LOW (ref 4.0–10.5)
nRBC: 0 % (ref 0.0–0.2)

## 2018-01-24 LAB — I-STAT TROPONIN, ED: Troponin i, poc: 0 ng/mL (ref 0.00–0.08)

## 2018-01-24 LAB — PROTIME-INR
INR: 1.03
Prothrombin Time: 13.4 seconds (ref 11.4–15.2)

## 2018-01-24 LAB — CBG MONITORING, ED: GLUCOSE-CAPILLARY: 101 mg/dL — AB (ref 70–99)

## 2018-01-24 MED ORDER — BRIMONIDINE TARTRATE 0.15 % OP SOLN
1.0000 [drp] | Freq: Two times a day (BID) | OPHTHALMIC | Status: DC
  Administered 2018-01-25: 1 [drp] via OPHTHALMIC
  Filled 2018-01-24: qty 5

## 2018-01-24 MED ORDER — ASPIRIN 325 MG PO TABS: 325.0000 mg | ORAL_TABLET | Freq: Every day | ORAL | Status: DC

## 2018-01-24 MED ORDER — ACETAMINOPHEN 160 MG/5ML PO SOLN: 650.0000 mg | ORAL | Status: DC | PRN

## 2018-01-24 MED ORDER — SODIUM CHLORIDE 0.9 % IV SOLN
INTRAVENOUS | Status: DC
  Administered 2018-01-25: 02:00:00 via INTRAVENOUS

## 2018-01-24 MED ORDER — EZETIMIBE 10 MG PO TABS
10.0000 mg | ORAL_TABLET | Freq: Every day | ORAL | Status: DC
  Administered 2018-01-25: 10 mg via ORAL
  Filled 2018-01-24 (×2): qty 1

## 2018-01-24 MED ORDER — ACETAMINOPHEN 650 MG RE SUPP: 650.0000 mg | RECTAL | Status: DC | PRN

## 2018-01-24 MED ORDER — ACETAMINOPHEN 325 MG PO TABS: 650.0000 mg | ORAL_TABLET | ORAL | Status: DC | PRN

## 2018-01-24 MED ORDER — ASPIRIN 300 MG RE SUPP: 300.0000 mg | Freq: Every day | RECTAL | Status: DC

## 2018-01-24 MED ORDER — STROKE: EARLY STAGES OF RECOVERY BOOK
Freq: Once | Status: AC
  Administered 2018-01-25: 02:00:00
  Filled 2018-01-24: qty 1

## 2018-01-24 MED ORDER — SENNOSIDES-DOCUSATE SODIUM 8.6-50 MG PO TABS: 1.0000 | ORAL_TABLET | Freq: Every evening | ORAL | Status: DC | PRN

## 2018-01-24 MED ORDER — METOPROLOL SUCCINATE 12.5 MG HALF TABLET
25.0000 mg | ORAL_TABLET | Freq: Every day | ORAL | Status: DC
  Administered 2018-01-25: 25 mg via ORAL
  Filled 2018-01-24: qty 2

## 2018-01-24 MED ORDER — LATANOPROST 0.005 % OP SOLN
1.0000 [drp] | Freq: Every day | OPHTHALMIC | Status: DC
  Filled 2018-01-24: qty 2.5

## 2018-01-24 MED ORDER — ALPRAZOLAM 0.5 MG PO TABS: 0.5000 mg | ORAL_TABLET | Freq: Two times a day (BID) | ORAL | Status: DC | PRN

## 2018-01-24 MED ORDER — ATORVASTATIN CALCIUM 80 MG PO TABS: 80.0000 mg | ORAL_TABLET | Freq: Every day | ORAL | Status: DC

## 2018-01-24 MED ORDER — ENOXAPARIN SODIUM 40 MG/0.4ML ~~LOC~~ SOLN
40.0000 mg | SUBCUTANEOUS | Status: DC
  Filled 2018-01-24: qty 0.4

## 2018-01-24 NOTE — ED Triage Notes (Signed)
Pt from home; LKW 2200 last night; woke up 0700 this am noticed R hand numbness; pt says she felt "wobbly"; pt then noticed she could not stick out her tongue while she was brushing her teeth; hx TIA in 2007; pt takes aggrenox 25/200 and asa

## 2018-01-24 NOTE — ED Notes (Signed)
Patient transported to MRI 

## 2018-01-24 NOTE — H&P (Signed)
History and Physical    [name] BSJ:[id number] DOB: 02-Jan-1946 DOA: 01/24/2018  Referring MD/NP/PA: Pryor Curia, MD PCP: Deland Pretty, MD  Patient coming from: Home  Chief Complaint: Didn't feel right  I have personally briefly reviewed patient's old medical records in [place]   HPI: [name] is a 72 y.o. 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  She had been in her normal state of health until this morning.  She is right-hand dominant and felt  "wobbly" while trying to brush her teeth.  Stated that she was unable to brush the back of her tongue like normal.  Then later on while trying to write a check noticed that her penmanship was off.  Patient reported similar symptoms with previous stroke back in 2007.  She reports that she was a little shaky with ambulation.  Denies any focal weakness, changes in vision, headache, falls, chest pain, or palpitations.  Patient has been on Aggrenox since her previous stroke and takes it as advised.  She is followed by Dr. Leonie Man of neurology in the outpatient setting.   ED Course: Upon admission into the emergency department patient was noted to be afebrile, blood pressures 155/74-194/73, and all other vital signs within normal limits.  Labs revealed WBC 3.9, and all other labs within normal limits.  MRI of the brain showed a 12 mm acute/early subacute infarct of the left posterior thalamus.  Neurology have been consulted and recommended completion of stroke work-up.  Review of Systems  Constitutional: Negative for fever and malaise/fatigue.  HENT: Negative for congestion and nosebleeds.   Eyes: Negative for photophobia and pain.  Respiratory: Negative for cough and wheezing.   Cardiovascular: Negative for chest pain and leg swelling.  Gastrointestinal: Negative  for nausea and vomiting.  Genitourinary: Negative for dysuria and hematuria.  Musculoskeletal: Negative for falls and joint pain.  Skin: Negative for itching.  Neurological: Positive for sensory change. Negative for focal weakness and loss of consciousness.       Positive for right hand incoordination  Psychiatric/Behavioral: Negative for suicidal ideas. The patient is not nervous/anxious.     Past Medical History:  Diagnosis Date  . Anemia   . Cystic breast   . Cystic breast   . History of chicken pox   . History of measles   . History of mumps   . Hypertension   . Stroke [facility])    TIA  . Trichomonas   . Trichomonas   . Yeast infection     Past Surgical History:  Procedure Laterality Date  . DILATION AND CURETTAGE OF UTERUS    . excision of fibroadenoma       reports that she has never smoked. She has never used smokeless tobacco. She reports that she does not drink alcohol or use drugs.  Allergies  Allergen Reactions  . Flagyl [Metronidazole] Itching and Swelling    Family History  Problem Relation Age of Onset  . Cancer Mother   . Cancer Father   . Deep vein thrombosis Sister   . Diabetes Sister   . Hyperlipidemia Sister   . Diabetes Daughter   . Hyperlipidemia Daughter     Prior to Admission medications   Medication Sig Start Date End Date Taking? Authorizing Provider  acetaminophen (TYLENOL) 500 MG tablet Take 500 mg by mouth every 6 (six) hours as needed  for mild pain or moderate pain.    Yes [provider]  ALPRAZolam Duanne Moron) 1 MG tablet Take by mouth 2 (two) times daily as needed.  11/19/15  Yes [provider]  atorvastatin (LIPITOR) 20 MG tablet Take 20 mg by mouth daily at 6 PM.    Yes [provider]  brimonidine (ALPHAGAN) 0.15 % ophthalmic solution Place 1 drop into both eyes 2 (two) times daily.    Yes [provider]  dipyridamole-aspirin (AGGRENOX) 200-25 MG 12hr capsule TAKE 1 CAPSULE BY MOUTH TWICE A  DAY Patient taking differently: Take 1 capsule by mouth 2 (two) times daily. TAKE 1 CAPSULE BY MOUTH TWICE A DAY 11/29/17  Yes Garvin Fila, MD  ezetimibe (ZETIA) 10 MG tablet Take 10 mg by mouth at bedtime.  11/23/17  Yes [provider]  latanoprost (XALATAN) 0.005 % ophthalmic solution Place 1 drop into both eyes at bedtime. 02/25/14  Yes [provider]  Metoprolol Succinate 25 MG CS24 Take 25 mg by mouth daily.    Yes [provider]    Physical Exam:  Constitutional: NAD, calm, comfortable Vitals:   01/24/18 1424 01/24/18 1445 01/24/18 1600 01/24/18 1812  BP: (!) 194/73 (!) 177/77 (!) 170/75 (!) 158/69  Pulse: 60 67 77 72  Resp: 18 16 (!) 22 16  SpO2: 100% 100% 98% 100%  Weight:      Height:       Eyes: PERRL, lids and conjunctivae normal ENMT: Mucous membranes are moist. Posterior pharynx clear of any exudate or lesions.  Neck: normal, supple, no masses, no thyromegaly Respiratory: clear to auscultation bilaterally, no wheezing, no crackles. Normal respiratory effort. No accessory muscle use.  Cardiovascular: Regular rate and rhythm, no murmurs / rubs / gallops. No extremity edema. 2+ pedal pulses. No carotid bruits.  Abdomen: no tenderness, no masses palpated. No hepatosplenomegaly. Bowel sounds positive.  Musculoskeletal: no clubbing / cyanosis. No joint deformity upper and lower extremities. Good ROM, no contractures. Normal muscle tone.  Skin: no rashes, lesions, ulcers. No induration Neurologic: CN 2-12 grossly intact. Sensation intact, DTR normal. Strength 5/5 in all 4.  Psychiatric: Normal judgment and insight. Alert and oriented x 3. Normal mood.     Labs on Admission: I have personally reviewed following labs and imaging studies  CBC: Recent Labs  Lab 01/24/18 1145 01/24/18 1206  WBC 3.9*  --   NEUTROABS 2.3  --   HGB 12.1 12.9  HCT 38.5 38.0  MCV 95.5  --   PLT 265  --    Basic Metabolic Panel: Recent Labs  Lab  01/24/18 1145 01/24/18 1206  NA 140 140  K 3.8 3.7  CL 107 107  CO2 23  --   GLUCOSE 113* 111*  BUN 10 12  CREATININE 0.73 0.60  CALCIUM 9.3  --    GFR: Estimated Creatinine Clearance: 63.4 mL/min (by C-G formula based on SCr of 0.6 mg/dL). Liver Function Tests: Recent Labs  Lab 01/24/18 1145  AST 26  ALT 17  ALKPHOS 60  BILITOT 0.7  PROT 7.2  ALBUMIN 3.8   No results for input(s): LIPASE, AMYLASE in the last 168 hours. No results for input(s): AMMONIA in the last 168 hours. Coagulation Profile: Recent Labs  Lab 01/24/18 1145  INR 1.03   Cardiac Enzymes: No results for input(s): CKTOTAL, CKMB, CKMBINDEX, TROPONINI in the last 168 hours. BNP (last 3 results) No results for input(s): PROBNP in the last 8760 hours. HbA1C: No results for  input(s): HGBA1C in the last 72 hours. CBG: Recent Labs  Lab 01/24/18 1154  GLUCAP 101*   Lipid Profile: No results for input(s): CHOL, HDL, LDLCALC, TRIG, CHOLHDL, LDLDIRECT in the last 72 hours. Thyroid Function Tests: No results for input(s): TSH, T4TOTAL, FREET4, T3FREE, THYROIDAB in the last 72 hours. Anemia Panel: No results for input(s): VITAMINB12, FOLATE, FERRITIN, TIBC, IRON, RETICCTPCT in the last 72 hours. Urine analysis:    Component Value Date/Time   COLORURINE YELLOW 08/18/2014 [place] 08/18/2014 1235   LABSPEC 1.017 08/18/2014 1235   PHURINE 7.0 08/18/2014 1235   GLUCOSEU NEGATIVE 08/18/2014 1235   HGBUR NEGATIVE 08/18/2014 1235   BILIRUBINUR NEGATIVE 08/18/2014 1235   BILIRUBINUR neg 08/25/2011 1011   KETONESUR NEGATIVE 08/18/2014 1235   PROTEINUR 30 (A) 08/18/2014 1235   UROBILINOGEN 0.2 08/18/2014 1235   NITRITE NEGATIVE 08/18/2014 1235   LEUKOCYTESUR NEGATIVE 08/18/2014 1235   Sepsis Labs: No results found for this or any previous visit (from the past 240 hour(s)).   Radiological Exams on Admission: Ct Head Wo Contrast  Result Date: 01/24/2018 CLINICAL DATA:  Pt c/o not  feeling herself and having numbness in her hands EXAM: CT HEAD WITHOUT CONTRAST TECHNIQUE: Contiguous axial images were obtained from the base of the skull through the vertex without intravenous contrast. COMPARISON:  12/03/2017 FINDINGS: Brain: No evidence of acute infarction, hemorrhage, extra-axial collection, ventriculomegaly, or mass effect. Generalized cerebral atrophy. Periventricular white matter low attenuation likely secondary to microangiopathy. Vascular: Cerebrovascular atherosclerotic calcifications are noted. Skull: Negative for fracture or focal lesion. Sinuses/Orbits: Visualized portions of the orbits are unremarkable. Visualized portions of the paranasal sinuses and mastoid air cells are unremarkable. Other: None. IMPRESSION: 1. No acute intracranial pathology. 2. Chronic microvascular disease and cerebral atrophy. Electronically Signed   By: Kathreen Devoid   On: 01/24/2018 12:51   Mr Brain Wo Contrast (neuro Protocol)  Addendum Date: 01/24/2018   ADDENDUM REPORT: 01/24/2018 19:52 ADDENDUM: These results were called by telephone at the time of interpretation on 01/24/2018 at 7:51 pm to Dr. Jeanell Sparrow, who verbally acknowledged these results. Electronically Signed   By: Kristine Garbe M.D.   On: 01/24/2018 19:52   Result Date: 01/24/2018 CLINICAL DATA:  72 y/o  F; right hand numbness. EXAM: MRI HEAD WITHOUT CONTRAST TECHNIQUE: Multiplanar, multiecho pulse sequences of the brain and surrounding structures were obtained without intravenous contrast. COMPARISON:  01/24/2018 CT head.  12/01/2017 MRI head. FINDINGS: Brain: Focus of reduced diffusion within the left posterior thalamus measuring 12 x 7 mm (series 3, image 33) compatible with acute/early subacute infarction. No associated hemorrhage or mass effect. Stable nonspecific T2 FLAIR hyperintensities in subcortical and periventricular white matter are compatible with chronic microvascular ischemic changes. Stable volume loss of the brain.  No focal mass effect, extra-axial collection, hydrocephalus, or herniation. Stable small chronic lacunar infarct within the left lentiform nucleus. Few nonspecific punctate foci of susceptibility hypointensity are compatible with chronic microhemorrhage. Vascular: Normal flow voids. Skull and upper cervical spine: Normal marrow signal. Sinuses/Orbits: Negative. Other: None. IMPRESSION: 1. 12 mm acute/early subacute infarction within left posterior thalamus. No hemorrhage or mass effect. 2. Stable mild-to-moderate chronic microvascular ischemic changes and volume loss of the brain for age. These results will be called to the ordering clinician or representative by the Radiologist Assistant, and communication documented in the PACS or zVision Dashboard. Electronically Signed: By: Kristine Garbe M.D. On: 01/24/2018 19:41    EKG: Independently reviewed.  Sinus rhythm 84 bpm  Assessment/Plan  CVA: Acute/subacute.  Patient presents with right hand "wobbliness".  MRI showing a 12 mm acute/early subacute infarct of the left posterior thalamus.  Patient follow-up by Dr. Leonie Man of neurology in the outpatient setting and previously had been on Aggrenox.  Neurology was consulted and recommendations as seen below. - Admit to telemetry bed - Stroke order set initiated - Neuro checks - check MRA of the brain - PT/OT/Speech to eval and treat - Check echocardiogram - Check Hemoglobin A1c and lipid panel in a.m. - Continue aggrenox and added on Plavix  Per neurology - Appreciate neurology consultative services, will follow-up for further recommendation  Cerebral aneurysms: Patient with known aneurysms of the left cavernosus carotid, right periophthalmic, and supraclinoid.  Patient was previously evaluated for need of coiling, but wanted to continue monitoring them for the time being. - Continue outpatient follow-up  Essential hypertension - Continue metoprolol  Hyperlipidemia - Continue Zetia -  Atorvastatin increase to 40 mg daily  Anxiety - Continue Xanax as needed for anxiety    DVT prophylaxis: Lovenox Code Status: Full Family Communication: Discussed plan of care with the patient and family present at bedside Disposition Plan: Likely discharge home in 2 to 3 days Consults called: neurology Admission status: Inpatient  Norval Morton MD [facility] Pager [phone]   If 7PM-7AM, please contact night-coverage [email or web address] Password [email or web address]  01/24/2018, 11:28 PM

## 2018-01-24 NOTE — ED Provider Notes (Signed)
72 year old female history of multiple mini strokes, cerebral aneurysms, presents today complaining of right hand weakness with last known normal at 10 PM last night.  She presented stating that she is having some difficulty writing.  She was seen by Dr. Rogene Houston and evaluated.  There is no evidence of bleeding on head CT.  MRI of the brain is pending.  Plan is to consult neurology and patient may be discharged home unless evidence of acute stroke Discussed with Dr.Kirkpatrick and will see for stroke.    Pattricia Boss, MD 01/24/18 [phone]

## 2018-01-24 NOTE — ED Provider Notes (Signed)
[facility] EMERGENCY DEPARTMENT Provider Note   CSN: [id number] Arrival date & time: 01/24/18  1120     History   Chief Complaint Chief Complaint  Patient presents with  . Stroke Symptoms    HPI [name] is a 72 y.o. female.  Patient here today with concern for possible recurrent stroke or TIA.  Can anything turned itself off patient last followed by [facility] neurology November 4.  They follow her for a known stroke in April 2007.  Patient was seen November 4 because of the Monday prior to that she had right sided facial numbness.  They stated in their note that she had no headache weakness speech or swallowing difficulty no arm or leg issues.  And that she was compliant with her Aggrenox.  Patient states she still taking her Aggrenox.  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  Patient is also met with Dr. Bubba Camp 3 times in the past to discuss endovascular treatment and patient has chosen conservative monitoring.  Patient was fine yesterday noticed no problem with her right hand.  Patient went to bed around 10:00 last night and everything seemed to be fine.  She got awake this morning at around 7.  Did not really use the hand but when she went to use the hand shortly after that she noted that she was having difficulty coordinating her right hand and could not write well.  She felt as if it was weaker would not respond properly.  She also noticed that she felt offkilter with her walking.  Patient without any true vertigo.  But just did not feel right and ahead with the walking.  Not sure if it was clearly described as dizziness.  Patient now feels as if there is improvement but still feels the hands not quite right when she tries to write.  No other  symptoms.     Past Medical History:  Diagnosis Date  . Anemia   . Cystic breast   . Cystic breast   . History of chicken pox   . History of measles   . History of mumps   . Hypertension   . Stroke [facility])    TIA  . Trichomonas   . Trichomonas   . Yeast infection     Patient Active Problem List   Diagnosis Date Noted  . Disorder of shoulder 03/18/2017  . Pain in joint of right shoulder 03/18/2017  . TIA (transient ischemic attack) 09/20/2015  . DDD (degenerative disc disease), cervical 09/20/2015  . History of stroke 09/20/2015  . Aneurysm, cerebral, nonruptured 03/28/2015  . Bilateral leg pain 10/26/2011  . Edema of lower extremity 10/26/2011  . Pelvic pain in female 08/25/2011  . Yeast infection   . Trichomonas   . Cystic breast   . Stroke ([place])   . History of chicken pox   . History of measles   . History of mumps     Past Surgical History:  Procedure Laterality Date  . DILATION AND CURETTAGE OF UTERUS    . excision of fibroadenoma       OB History    Gravida  2   Para  2   Term  2   Preterm      AB  Living  2     SAB      TAB      Ectopic      Multiple      Live Births  2            Home Medications    Prior to Admission medications   Medication Sig Start Date End Date Taking? Authorizing Provider  acetaminophen (TYLENOL) 500 MG tablet Take 500 mg by mouth every 6 (six) hours as needed for mild pain or moderate pain.    Yes [provider]  ALPRAZolam Duanne Moron) 1 MG tablet Take by mouth 2 (two) times daily as needed.  11/19/15  Yes [provider]  atorvastatin (LIPITOR) 20 MG tablet Take 20 mg by mouth daily at 6 PM.    Yes [provider]  brimonidine (ALPHAGAN) 0.15 % ophthalmic solution Place 1 drop into both eyes 2 (two) times daily.    Yes [provider]  dipyridamole-aspirin (AGGRENOX) 200-25 MG 12hr capsule TAKE 1 CAPSULE BY MOUTH TWICE A DAY Patient taking differently: Take 1 capsule  by mouth 2 (two) times daily. TAKE 1 CAPSULE BY MOUTH TWICE A DAY 11/29/17  Yes Garvin Fila, MD  ezetimibe (ZETIA) 10 MG tablet Take 10 mg by mouth at bedtime.  11/23/17  Yes [provider]  latanoprost (XALATAN) 0.005 % ophthalmic solution Place 1 drop into both eyes at bedtime. 02/25/14  Yes [provider]  Metoprolol Succinate 25 MG CS24 Take 25 mg by mouth daily.    Yes [provider]    Family History Family History  Problem Relation Age of Onset  . Cancer Mother   . Cancer Father   . Deep vein thrombosis Sister   . Diabetes Sister   . Hyperlipidemia Sister   . Diabetes Daughter   . Hyperlipidemia Daughter     Social History Social History   Tobacco Use  . Smoking status: Never Smoker  . Smokeless tobacco: Never Used  Substance Use Topics  . Alcohol use: No    Alcohol/week: 0.0 standard drinks  . Drug use: No     Allergies   Flagyl [metronidazole]   Review of Systems Review of Systems  Constitutional: Negative for fever.  HENT: Negative for congestion and trouble swallowing.   Eyes: Negative for redness.  Respiratory: Negative for shortness of breath.   Cardiovascular: Negative for chest pain.  Gastrointestinal: Negative for abdominal pain.  Genitourinary: Negative for dysuria.  Musculoskeletal: Negative for back pain and neck pain.  Skin: Negative for rash.  Neurological: Positive for dizziness, weakness and numbness. Negative for syncope, facial asymmetry, speech difficulty and headaches.  Hematological: Does not bruise/bleed easily.  Psychiatric/Behavioral: Negative for confusion.     Physical Exam Updated Vital Signs BP (!) 170/75   Pulse 77   Resp (!) 22   Ht 1.676 m ('5\' 6"'$ )   Wt 73.9 kg   SpO2 98%   BMI 26.31 kg/m   Physical Exam Vitals signs and nursing note reviewed.  Constitutional:      General: She is not in acute distress.    Appearance: Normal appearance.  HENT:     Head: Normocephalic and  atraumatic.     Mouth/Throat:     Mouth: Mucous membranes are moist.  Eyes:     Extraocular Movements: Extraocular movements intact.     Pupils: Pupils are equal, round, and reactive to light.  Neck:     Musculoskeletal: Normal range of motion and neck supple.  Cardiovascular:     Rate and Rhythm: Normal rate and regular rhythm.  Pulmonary:     Effort: Pulmonary effort is normal.     Breath sounds: Normal breath sounds.  Abdominal:     General: Abdomen is flat.     Palpations: Abdomen is soft.     Tenderness: There is no abdominal tenderness.  Musculoskeletal:        General: No swelling, tenderness or deformity.     Comments: Patient's right hand despite the complaint.  Has good grip strength.  Motor functions normal good extension.  Radial pulses 2+ capillary refills less than 2 seconds no sensory deficit.  Patient just feels as if is not coordinating properly.  Skin:    General: Skin is warm.     Capillary Refill: Capillary refill takes less than 2 seconds.  Neurological:     General: No focal deficit present.     Mental Status: She is alert and oriented to person, place, and time.     Cranial Nerves: No cranial nerve deficit.     Sensory: No sensory deficit.     Motor: No weakness.      ED Treatments / Results  Labs (all labs ordered are listed, but only abnormal results are displayed) Labs Reviewed  CBC - Abnormal; Notable for the following components:      Result Value   WBC 3.9 (*)    All other components within normal limits  COMPREHENSIVE METABOLIC PANEL - Abnormal; Notable for the following components:   Glucose, Bld 113 (*)    All other components within normal limits  CBG MONITORING, ED - Abnormal; Notable for the following components:   Glucose-Capillary 101 (*)    All other components within normal limits  I-STAT CHEM 8, ED - Abnormal; Notable for the following components:   Glucose, Bld 111 (*)    All other components within normal limits  PROTIME-INR   APTT  DIFFERENTIAL  I-STAT TROPONIN, ED    EKG EKG Interpretation  Date/Time:  Monday January 24 2018 11:32:12 EST Ventricular Rate:  84 PR Interval:    QRS Duration: 92 QT Interval:  386 QTC Calculation: 457 R Axis:   14 Text Interpretation:  Sinus rhythm Probable left atrial enlargement Abnormal R-wave progression, early transition Minimal ST elevation, inferior leads Confirmed by Fredia Sorrow [phone]) on 01/24/2018 11:44:00 AM Also confirmed by Fredia Sorrow [phone]), editor Philomena Doheny [phone])  on 01/24/2018 1:12:23 PM   Radiology Ct Head Wo Contrast  Result Date: 01/24/2018 CLINICAL DATA:  Pt c/o not feeling herself and having numbness in her hands EXAM: CT HEAD WITHOUT CONTRAST TECHNIQUE: Contiguous axial images were obtained from the base of the skull through the vertex without intravenous contrast. COMPARISON:  12/03/2017 FINDINGS: Brain: No evidence of acute infarction, hemorrhage, extra-axial collection, ventriculomegaly, or mass effect. Generalized cerebral atrophy. Periventricular white matter low attenuation likely secondary to microangiopathy. Vascular: Cerebrovascular atherosclerotic calcifications are noted. Skull: Negative for fracture or focal lesion. Sinuses/Orbits: Visualized portions of the orbits are unremarkable. Visualized portions of the paranasal sinuses and mastoid air cells are unremarkable. Other: None. IMPRESSION: 1. No acute intracranial pathology. 2. Chronic microvascular disease and cerebral atrophy. Electronically Signed   By: Kathreen Devoid   On: 01/24/2018 12:51    Procedures Procedures (including critical care time)  Medications Ordered in ED Medications - No data to display   Initial Impression / Assessment and Plan / ED Course  I have reviewed the triage vital signs and the nursing  notes.  Pertinent labs & imaging results that were available during my care of the patient were reviewed by me and considered in my medical decision making  (see chart for details).     Work-up here CT head without any acute findings.  Neuro exam without any significant abnormalities.  Recheck just recently for good hand strength.  Her right hand appears to be normal but she feels it still not quite right.  Patient concerned about stroke and with her past history I have ordered MRI brain.  Also placed call into the neuro hospitalist to see whether we needed to do anything in addition to the MRI particularly in with concern for history of aneurysm.  CT head shows no evidence of any any head bleed or any abnormalities.  Labs without any significant abnormalities.  Disposition as per MRI and less neuro hospitalist has any objections if the MRI is negative would expect patient to be discharged home.  And follow back up with [facility] neurology.  Final Clinical Impressions(s) / ED Diagnoses   Final diagnoses:  Weakness of right hand  Numbness of right hand  Dizziness    ED Discharge Orders    None       Fredia Sorrow, MD 01/24/18 1752

## 2018-01-24 NOTE — ED Notes (Signed)
[name] (daughter) [phone]

## 2018-01-24 NOTE — ED Notes (Signed)
Pt given food and beverage per Dr. Rogene Houston

## 2018-01-24 NOTE — ED Notes (Signed)
Pt returned from ct

## 2018-01-25 ENCOUNTER — Inpatient Hospital Stay (HOSPITAL_COMMUNITY): Payer: Medicare Other

## 2018-01-25 DIAGNOSIS — I671 Cerebral aneurysm, nonruptured: Secondary | ICD-10-CM

## 2018-01-25 DIAGNOSIS — I639 Cerebral infarction, unspecified: Secondary | ICD-10-CM

## 2018-01-25 DIAGNOSIS — F419 Anxiety disorder, unspecified: Secondary | ICD-10-CM | POA: Diagnosis present

## 2018-01-25 DIAGNOSIS — R200 Anesthesia of skin: Secondary | ICD-10-CM

## 2018-01-25 DIAGNOSIS — E785 Hyperlipidemia, unspecified: Secondary | ICD-10-CM | POA: Diagnosis present

## 2018-01-25 DIAGNOSIS — I63332 Cerebral infarction due to thrombosis of left posterior cerebral artery: Secondary | ICD-10-CM

## 2018-01-25 DIAGNOSIS — I10 Essential (primary) hypertension: Secondary | ICD-10-CM | POA: Diagnosis present

## 2018-01-25 LAB — BASIC METABOLIC PANEL
Anion gap: 7 (ref 5–15)
BUN: 12 mg/dL (ref 8–23)
CO2: 24 mmol/L (ref 22–32)
Calcium: 9.2 mg/dL (ref 8.9–10.3)
Chloride: 111 mmol/L (ref 98–111)
Creatinine, Ser: 0.57 mg/dL (ref 0.44–1.00)
GFR calc Af Amer: >60 mL/min (ref >60)
GFR calc non Af Amer: >60 mL/min (ref >60)
Glucose, Bld: 94 mg/dL (ref 70–99)
POTASSIUM: 3.8 mmol/L (ref 3.5–5.1)
Sodium: 142 mmol/L (ref 135–145)

## 2018-01-25 LAB — LIPID PANEL
CHOL/HDL RATIO: 3 ratio
Cholesterol: 158 mg/dL (ref 0–200)
HDL: 53 mg/dL (ref >40)
LDL Cholesterol: 95 mg/dL (ref 0–99)
Triglycerides: 51 mg/dL (ref <150)
VLDL: 10 mg/dL (ref 0–40)

## 2018-01-25 LAB — CBC
HCT: 35.3 % — ABNORMAL LOW (ref 36.0–46.0)
Hemoglobin: 11.6 g/dL — ABNORMAL LOW (ref 12.0–15.0)
MCH: 30.2 pg (ref 26.0–34.0)
MCHC: 32.9 g/dL (ref 30.0–36.0)
MCV: 91.9 fL (ref 80.0–100.0)
NRBC: 0 % (ref 0.0–0.2)
PLATELETS: 254 10*3/uL (ref 150–400)
RBC: 3.84 MIL/uL — AB (ref 3.87–5.11)
RDW: 15 % (ref 11.5–15.5)
WBC: 4.8 10*3/uL (ref 4.0–10.5)

## 2018-01-25 LAB — ECHOCARDIOGRAM COMPLETE
Height: 66 in
Weight: 2608 oz

## 2018-01-25 LAB — HEMOGLOBIN A1C
Hgb A1c MFr Bld: 5.7 % — ABNORMAL HIGH (ref 4.8–5.6)
Mean Plasma Glucose: 116.89 mg/dL

## 2018-01-25 MED ORDER — ASPIRIN EC 81 MG PO TBEC
81.0000 mg | DELAYED_RELEASE_TABLET | Freq: Every day | ORAL | Status: DC
  Administered 2018-01-25: 81 mg via ORAL
  Filled 2018-01-25: qty 1

## 2018-01-25 MED ORDER — ASPIRIN 81 MG PO TBEC: 81.0000 mg | DELAYED_RELEASE_TABLET | Freq: Every day | ORAL | 0 refills | Status: DC

## 2018-01-25 MED ORDER — CLOPIDOGREL BISULFATE 75 MG PO TABS
75.0000 mg | ORAL_TABLET | Freq: Every day | ORAL | Status: DC
  Administered 2018-01-25: 75 mg via ORAL
  Filled 2018-01-25: qty 1

## 2018-01-25 MED ORDER — ATORVASTATIN CALCIUM 40 MG PO TABS: 40.0000 mg | ORAL_TABLET | Freq: Every day | ORAL | 0 refills | Status: AC

## 2018-01-25 MED ORDER — SENNOSIDES-DOCUSATE SODIUM 8.6-50 MG PO TABS: 1.0000 | ORAL_TABLET | Freq: Every evening | ORAL | 0 refills | Status: DC | PRN

## 2018-01-25 MED ORDER — CLOPIDOGREL BISULFATE 75 MG PO TABS: 75.0000 mg | ORAL_TABLET | Freq: Every day | ORAL | 0 refills | Status: DC

## 2018-01-25 MED ORDER — ATORVASTATIN CALCIUM 40 MG PO TABS
40.0000 mg | ORAL_TABLET | Freq: Every day | ORAL | Status: DC
  Administered 2018-01-25: 40 mg via ORAL
  Filled 2018-01-25 (×2): qty 1

## 2018-01-25 MED ORDER — ASPIRIN-DIPYRIDAMOLE ER 25-200 MG PO CP12
1.0000 | ORAL_CAPSULE | Freq: Two times a day (BID) | ORAL | Status: DC
  Administered 2018-01-25: 1 via ORAL
  Filled 2018-01-25: qty 1

## 2018-01-25 MED ORDER — HYDRALAZINE HCL 20 MG/ML IJ SOLN: 10.0000 mg | INTRAMUSCULAR | Status: DC | PRN

## 2018-01-25 NOTE — Progress Notes (Signed)
Carotid duplex has been completed.   Preliminary results in CV Proc.   Abram Sander 01/25/2018 11:02 AM

## 2018-01-25 NOTE — Evaluation (Signed)
Physical Therapy Evaluation Patient Details Name: [name] MRN: [id number] DOB: July 16, 1945 Today's Date: 01/25/2018   History of Present Illness  72 y.o. female who presented from home with c/c of right hand sensory numbness and incoordination. 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 PMHx includes HTN, TIA and anemia.    Clinical Impression  Patient admitted with the above listed diagnosis. Patient reports IND with all mobility and ADLs prior to admission. Patient today requiring supervision level assist for all transfers and mobility for general safety - no need for physical assist for stability - no LOB. Patient with reports of feeling at/near baseline level of functional mobility. No further acute PT needs identified. PT to sign off. Thanks for the referral.     Follow Up Recommendations No PT follow up    Equipment Recommendations  None recommended by PT    Recommendations for Other Services OT consult(as ordered)     Precautions / Restrictions Precautions Precautions: Fall Restrictions Weight Bearing Restrictions: No      Mobility  Bed Mobility Overal bed mobility: Modified Independent                Transfers Overall transfer level: Needs assistance Equipment used: None Transfers: Sit to/from Stand Sit to Stand: Supervision         General transfer comment: for safety - no LOB  Ambulation/Gait Ambulation/Gait assistance: Supervision Gait Distance (Feet): 200 Feet Assistive device: None Gait Pattern/deviations: Step-through pattern;Decreased stride length Gait velocity: WNL   General Gait Details: Supervision for safety - no LOB or overt instability  Stairs Stairs: Yes Stairs assistance: Supervision Stair Management: No rails;Alternating pattern;Forwards Number of Stairs: 2    Wheelchair Mobility    Modified Rankin (Stroke  Patients Only) Modified Rankin (Stroke Patients Only) Pre-Morbid Rankin Score: No symptoms Modified Rankin: Moderate disability     Balance Overall balance assessment: Modified Independent                               Standardized Balance Assessment Standardized Balance Assessment : Dynamic Gait Index   Dynamic Gait Index Level Surface: Normal Change in Gait Speed: Normal Gait with Horizontal Head Turns: Mild Impairment Gait with Vertical Head Turns: Normal Gait and Pivot Turn: Mild Impairment Step Over Obstacle: Normal Step Around Obstacles: Normal Steps: Normal Total Score: 22       Pertinent Vitals/Pain Pain Assessment: No/denies pain    Home Living Family/patient expects to be discharged to:: Private residence Living Arrangements: Alone Available Help at Discharge: Family;Available PRN/intermittently Type of Home: House Home Access: Stairs to enter Entrance Stairs-Rails: None Entrance Stairs-Number of Steps: 2 Home Layout: Two level Home Equipment: None      Prior Function Level of Independence: Independent         Comments: drives; babysits grandson     Hand Dominance   Dominant Hand: Right    Extremity/Trunk Assessment   Upper Extremity Assessment Upper Extremity Assessment: RUE deficits/detail RUE Deficits / Details: (P) mild sensory motor inncoordination but functional RUE Coordination: (P) decreased fine motor    Lower Extremity Assessment Lower Extremity Assessment: (P) Overall WFL for tasks assessed    Cervical / Trunk Assessment Cervical / Trunk Assessment: (P) Normal  Communication   Communication: No difficulties  Cognition Arousal/Alertness: Awake/alert Behavior During Therapy: WFL for tasks assessed/performed Overall Cognitive Status: Within Functional Limits  for tasks assessed                                        General Comments General comments (skin integrity, edema, etc.): (P) Daughter  presetn for session    Exercises     Assessment/Plan    PT Assessment Patent does not need any further PT services  PT Problem List         PT Treatment Interventions      PT Goals (Current goals can be found in the Care Plan section)  Acute Rehab PT Goals Patient Stated Goal: return home soon PT Goal Formulation: With patient Time For Goal Achievement: 02/01/18 Potential to Achieve Goals: Good    Frequency     Barriers to discharge        Co-evaluation               AM-PAC PT "6 Clicks" Mobility  Outcome Measure Help needed turning from your back to your side while in a flat bed without using bedrails?: None Help needed moving from lying on your back to sitting on the side of a flat bed without using bedrails?: None Help needed moving to and from a bed to a chair (including a wheelchair)?: None Help needed standing up from a chair using your arms (e.g., wheelchair or bedside chair)?: None Help needed to walk in hospital room?: A Little Help needed climbing 3-5 steps with a railing? : A Little 6 Click Score: 22    End of Session Equipment Utilized During Treatment: Gait belt Activity Tolerance: Patient tolerated treatment well Patient left: in bed;with call bell/phone within reach;with family/visitor present Nurse Communication: Mobility status PT Visit Diagnosis: Unsteadiness on feet (R26.81)    Time: 5329-9242 PT Time Calculation (min) (ACUTE ONLY): 21 min   Charges:   PT Evaluation $PT Eval Moderate Complexity: 1 Mod         Lanney Gins, PT, DPT Supplemental Physical Therapist 01/25/18 10:14 AM Pager: [phone] Office: [phone]

## 2018-01-25 NOTE — Progress Notes (Signed)
  Echocardiogram 2D Echocardiogram has been performed.  Matilde Bash 01/25/2018, 11:07 AM

## 2018-01-25 NOTE — Progress Notes (Signed)
Arrived from ED. Alert and oriented. Denies any pain. Ambulatory to restroom independently. Call light within reach.

## 2018-01-25 NOTE — Progress Notes (Signed)
Pt leaving unit via wheelchair to home. IV removed. CCMD notified. All belongings with patient.

## 2018-01-25 NOTE — Progress Notes (Signed)
OT Treatment Note  Completed education with family regarding fine motor/coordination and eye hand coordination activities. Educated on home safety and reducing risk of falls. No further OT needed.     01/25/18 1700  OT Visit Information  Last OT Received On 01/25/18  History of Present Illness 72 y.o. female who presented from home with c/c of right hand sensory numbness and incoordination. 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 PMHx includes HTN, TIA and anemia.  Precautions  Precautions None  Pain Assessment  Pain Assessment No/denies pain  Cognition  Arousal/Alertness Awake/alert  Behavior During Therapy WFL for tasks assessed/performed  Overall Cognitive Status Within Functional Limits for tasks assessed  Other Exercises  Other Exercises fine motor and coordination handout  Other Exercises eye hand coordination  OT - End of Session  Activity Tolerance Patient tolerated treatment well  Patient left Other (comment) (in bathroom)  Nurse Communication Mobility status  OT Assessment/Plan  OT Plan All goals met and education completed, patient discharged from OT services  Follow Up Recommendations No OT follow up  OT Equipment None recommended by OT  AM-PAC OT "6 Clicks" Daily Activity Outcome Measure (Version 2)  Help from another person eating meals? 4  Help from another person taking care of personal grooming? 4  Help from another person toileting, which includes using toliet, bedpan, or urinal? 4  Help from another person bathing (including washing, rinsing, drying)? 4  Help from another person to put on and taking off regular upper body clothing? 4  Help from another person to put on and taking off regular lower body clothing? 4  6 Click Score 24  OT Goal Progression  Progress towards OT goals Goals met/education completed, patient discharged from OT  Acute Rehab OT Goals   Patient Stated Goal to babysit her grandson and return to volunteering  OT Goal Formulation With patient  Time For Goal Achievement 02/01/18  Potential to Achieve Goals Good  ADL Goals  Additional ADL Goal #1 Pt will demonstrate independence with HEP for fine motor/coordination tasks  OT Time Calculation  OT Start Time (ACUTE ONLY) 1717  OT Stop Time (ACUTE ONLY) 1729  OT Time Calculation (min) 12 min  OT General Charges  $OT Visit 1 Visit  OT Treatments  $Therapeutic Activity 8-22 mins  Maurie Boettcher, OT/L   Acute OT Clinical Specialist [place] Pager [phone] Office [phone]

## 2018-01-25 NOTE — Consult Note (Signed)
Referring Physician: Dr. Tamala Julian    Chief Complaint: Right hand weakness  HPI: [name] is an 72 y.o. female who presented from home with c/c of right hand sensory numbness and incoordination. LKN was 2200 on Sunday. The symptoms were present on awakening at 0700 Monday morning. She also felt "wobbly" had difficulty grabbing a toothbrush and could not stick out her tongue while brushing her teeth. She has a history of TIA (2007) and has seen Dr. Leonie Man as an outpatient. She takes Aggrenox for secondary stroke prevention. 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   PMHx includes HTN, TIA and anemia.     Past Medical History:  Diagnosis Date  . Anemia   . Cystic breast   . Cystic breast   . History of chicken pox   . History of measles   . History of mumps   . Hypertension   . Stroke [facility])    TIA  . Trichomonas   . Trichomonas   . Yeast infection     Past Surgical History:  Procedure Laterality Date  . DILATION AND CURETTAGE OF UTERUS    . excision of fibroadenoma      Family History  Problem Relation Age of Onset  . Cancer Mother   . Cancer Father   . Deep vein thrombosis Sister   . Diabetes Sister   . Hyperlipidemia Sister   . Diabetes Daughter   . Hyperlipidemia Daughter    Social History:  reports that she has never smoked. She has never used smokeless tobacco. She reports that she does not drink alcohol or use drugs.  Allergies:  Allergies  Allergen Reactions  . Flagyl [Metronidazole] Itching and Swelling   Home Medications:  Current Meds  Medication Sig  . acetaminophen (TYLENOL) 500 MG tablet Take 500 mg by mouth every 6 (six) hours as needed for mild pain or moderate pain.   Marland Kitchen ALPRAZolam (XANAX) 1 MG tablet Take by mouth 2 (two) times daily as needed.   Marland Kitchen atorvastatin (LIPITOR) 20 MG tablet Take 20 mg by mouth daily at 6 PM.   . brimonidine  (ALPHAGAN) 0.15 % ophthalmic solution Place 1 drop into both eyes 2 (two) times daily.   Marland Kitchen dipyridamole-aspirin (AGGRENOX) 200-25 MG 12hr capsule TAKE 1 CAPSULE BY MOUTH TWICE A DAY (Patient taking differently: Take 1 capsule by mouth 2 (two) times daily. TAKE 1 CAPSULE BY MOUTH TWICE A DAY)  . ezetimibe (ZETIA) 10 MG tablet Take 10 mg by mouth at bedtime.   Marland Kitchen latanoprost (XALATAN) 0.005 % ophthalmic solution Place 1 drop into both eyes at bedtime.  . Metoprolol Succinate 25 MG CS24 Take 25 mg by mouth daily.      ROS: No cough, SOB, CP, fever, headache, aphasia, confusion, dysuria or abdominal pain. No bleeding problems. No rash. Other ROS as per HPI.   Physical Examination: Blood pressure (!) 155/74, pulse 84, temperature 98.6 F (37 C), temperature source Oral, resp. rate 20, height 5\' 6"  (1.676 m), weight 73.9 kg, SpO2 100 %.  HEENT: [place]/AT Lungs: Respirations unlabored Ext: Mild edema distal BLE  Neurologic Examination: Mental Status: Alert, oriented, thought content appropriate.  Speech fluent without evidence of aphasia.  Able to follow all commands without difficulty. Cranial Nerves: II:  Visual fields intact. PERRL.  III,IV, VI: EOMI. No ptosis. No nystagmus.   V,VII: Facial temp sensation equal bilaterally. No facial droop.  VIII:  hearing intact to voice IX,X: No hypophonia or hoarseness XI: Symmetric XII: midline tongue extension  Motor: RUE: Mild 4/5 weakness to right grip and triceps. Otherwise 5/5 RLE: 5/5 LUE and LLE: 5/5 Subtle parietal drift on the right Sensory: Temp and FT intact x 4 Deep Tendon Reflexes:  2+ bilateral upper and lower extremities Plantars: Equivocal bilaterally  Cerebellar: No ataxia with FNF bilaterally Gait: Deferred  Results for orders placed or performed during the hospital encounter of 01/24/18 (from the past 48 hour(s))  Protime-INR     Status: None   Collection Time: 01/24/18 11:45 AM  Result Value Ref Range   Prothrombin Time 13.4  11.4 - 15.2 seconds   INR 1.03     Comment: Performed at [facility], [place] [address] [place], [place] [address]  APTT     Status: None   Collection Time: 01/24/18 11:45 AM  Result Value Ref Range   aPTT 29 24 - 36 seconds    Comment: Performed at [facility] [address] [place], [place] [address]  CBC     Status: Abnormal   Collection Time: 01/24/18 11:45 AM  Result Value Ref Range   WBC 3.9 (L) 4.0 - 10.5 K/uL   RBC 4.03 3.87 - 5.11 MIL/uL   Hemoglobin 12.1 12.0 - 15.0 g/dL   HCT 38.5 36.0 - 46.0 %   MCV 95.5 80.0 - 100.0 fL   MCH 30.0 26.0 - 34.0 pg   MCHC 31.4 30.0 - 36.0 g/dL   RDW 15.4 11.5 - 15.5 %   Platelets 265 150 - 400 K/uL   nRBC 0.0 0.0 - 0.2 %    Comment: Performed at [facility], [place] [address], [place], [place] [address]  Differential     Status: None   Collection Time: 01/24/18 11:45 AM  Result Value Ref Range   Neutrophils Relative % 59 %   Neutro Abs 2.3 1.7 - 7.7 K/uL   Lymphocytes Relative 27 %   Lymphs Abs 1.1 0.7 - 4.0 K/uL   Monocytes Relative 12 %   Monocytes Absolute 0.5 0.1 - 1.0 K/uL   Eosinophils Relative 1 %   Eosinophils Absolute 0.0 0.0 - 0.5 K/uL   Basophils Relative 1 %   Basophils Absolute 0.0 0.0 - 0.1 K/uL   Immature Granulocytes 0 %   Abs Immature Granulocytes 0.01 0.00 - 0.07 K/uL    Comment: Performed at [facility] [address] [place], [place] [address]  Comprehensive metabolic panel     Status: Abnormal   Collection Time: 01/24/18 11:45 AM  Result Value Ref Range   Sodium 140 135 - 145 mmol/L   Potassium 3.8 3.5 - 5.1 mmol/L   Chloride 107 98 - 111 mmol/L   CO2 23 22 - 32 mmol/L   Glucose, Bld 113 (H) 70 - 99 mg/dL   BUN 10 8 - 23 mg/dL   Creatinine, Ser 0.73 0.44 - 1.00 mg/dL   Calcium 9.3 8.9 - 10.3 mg/dL   Total Protein 7.2 6.5 - 8.1 g/dL   Albumin 3.8 3.5 - 5.0 g/dL   AST 26 15 - 41 U/L   ALT 17 0 - 44 U/L   Alkaline Phosphatase 60 38 - 126 U/L   Total Bilirubin 0.7 0.3 - 1.2  mg/dL   GFR calc non Af Amer >60 >60 mL/min   GFR calc Af Amer >60 >60 mL/min   Anion gap 10 5 - 15  Comment: Performed at [facility], [place] [address] [place], [place] [address]  CBG monitoring, ED     Status: Abnormal   Collection Time: 01/24/18 11:54 AM  Result Value Ref Range   Glucose-Capillary 101 (H) 70 - 99 mg/dL  I-stat troponin, ED     Status: None   Collection Time: 01/24/18 12:04 PM  Result Value Ref Range   Troponin i, poc 0.00 0.00 - 0.08 ng/mL   Comment 3            Comment: Due to the release kinetics of cTnI, a negative result within the first hours of the onset of symptoms does not rule out myocardial infarction with certainty. If myocardial infarction is still suspected, repeat the test at appropriate intervals.   I-Stat Chem 8, ED     Status: Abnormal   Collection Time: 01/24/18 12:06 PM  Result Value Ref Range   Sodium 140 135 - 145 mmol/L   Potassium 3.7 3.5 - 5.1 mmol/L   Chloride 107 98 - 111 mmol/L   BUN 12 8 - 23 mg/dL   Creatinine, Ser 0.60 0.44 - 1.00 mg/dL   Glucose, Bld 111 (H) 70 - 99 mg/dL   Calcium, Ion 1.25 1.15 - 1.40 mmol/L   TCO2 27 22 - 32 mmol/L   Hemoglobin 12.9 12.0 - 15.0 g/dL   HCT 38.0 36.0 - 46.0 %   Ct Head Wo Contrast  Result Date: 01/24/2018 CLINICAL DATA:  Pt c/o not feeling herself and having numbness in her hands EXAM: CT HEAD WITHOUT CONTRAST TECHNIQUE: Contiguous axial images were obtained from the base of the skull through the vertex without intravenous contrast. COMPARISON:  12/03/2017 FINDINGS: Brain: No evidence of acute infarction, hemorrhage, extra-axial collection, ventriculomegaly, or mass effect. Generalized cerebral atrophy. Periventricular white matter low attenuation likely secondary to microangiopathy. Vascular: Cerebrovascular atherosclerotic calcifications are noted. Skull: Negative for fracture or focal lesion. Sinuses/Orbits: Visualized portions of the orbits are unremarkable. Visualized portions of  the paranasal sinuses and mastoid air cells are unremarkable. Other: None. IMPRESSION: 1. No acute intracranial pathology. 2. Chronic microvascular disease and cerebral atrophy. Electronically Signed   By: Kathreen Devoid   On: 01/24/2018 12:51   Mr Brain Wo Contrast (neuro Protocol)  Addendum Date: 01/24/2018   ADDENDUM REPORT: 01/24/2018 19:52 ADDENDUM: These results were called by telephone at the time of interpretation on 01/24/2018 at 7:51 pm to Dr. Jeanell Sparrow, who verbally acknowledged these results. Electronically Signed   By: Kristine Garbe M.D.   On: 01/24/2018 19:52   Result Date: 01/24/2018 CLINICAL DATA:  72 y/o  F; right hand numbness. EXAM: MRI HEAD WITHOUT CONTRAST TECHNIQUE: Multiplanar, multiecho pulse sequences of the brain and surrounding structures were obtained without intravenous contrast. COMPARISON:  01/24/2018 CT head.  12/01/2017 MRI head. FINDINGS: Brain: Focus of reduced diffusion within the left posterior thalamus measuring 12 x 7 mm (series 3, image 33) compatible with acute/early subacute infarction. No associated hemorrhage or mass effect. Stable nonspecific T2 FLAIR hyperintensities in subcortical and periventricular white matter are compatible with chronic microvascular ischemic changes. Stable volume loss of the brain. No focal mass effect, extra-axial collection, hydrocephalus, or herniation. Stable small chronic lacunar infarct within the left lentiform nucleus. Few nonspecific punctate foci of susceptibility hypointensity are compatible with chronic microhemorrhage. Vascular: Normal flow voids. Skull and upper cervical spine: Normal marrow signal. Sinuses/Orbits: Negative. Other: None. IMPRESSION: 1. 12 mm acute/early subacute infarction within left posterior thalamus. No hemorrhage or mass effect. 2. Stable  mild-to-moderate chronic microvascular ischemic changes and volume loss of the brain for age. These results will be called to the ordering clinician or  representative by the Radiologist Assistant, and communication documented in the PACS or zVision Dashboard. Electronically Signed: By: Kristine Garbe M.D. On: 01/24/2018 19:41    Assessment: 72 y.o. female with acute left thalamic lacunar infarction.  1. Neurological exam with subtle RUE weakness. No sensory deficits noted.  2. 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  3. 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 4. Stroke Risk Factors - HTN and TIA.   Plan: 1. HgbA1c, fasting lipid panel 2. MRA of the brain without contrast 3. PT consult, OT consult, Speech consult 4. Echocardiogram 5. Carotid dopplers 6. Prophylactic therapy- Add Plavix to Aggrenox for DAPT as she has failed therapy with a single agent 7. Risk factor modification 8. Telemetry monitoring 9. Frequent neuro checks 10. Continue atorvastatin 11. Out of permissive HTN time window. Goal SBP 120 - 140 12. Advised to start keeping a BP diary   @Electronically  signed: Dr. Kerney Elbe 01/25/2018, 1:22 AM

## 2018-01-25 NOTE — Progress Notes (Signed)
Occupational Therapy Evaluation Patient Details Name: [name] MRN: [id number] DOB: [birth year] Today's Date: 01/25/2018    History of Present Illness 72 y.o. female who presented from home with c/c of right hand sensory numbness and incoordination. 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 PMHx includes HTN, TIA and anemia.   Clinical Impression   PTA, pt lived independently, drove, was active volunteering in the community and babysat her grandson. Pt presents with mild incoordination deficits with RUE. Began education on HEP. Session limited due to pt going for test. Will return later to complete education. Pt will not need OT follow up after DC.     Follow Up Recommendations  No OT follow up    Equipment Recommendations  None recommended by OT    Recommendations for Other Services       Precautions / Restrictions Precautions Precautions: Fall Restrictions Weight Bearing Restrictions: No      Mobility Bed Mobility Overal bed mobility: Independent                Transfers Overall transfer level: Modified independent Equipment used: None Transfers: Sit to/from Stand Sit to Stand: Supervision         General transfer comment: for safety - no LOB    Balance Overall balance assessment: Modified Independent                                    ADL either performed or assessed with clinical judgement   ADL Overall ADL's : At baseline                                             Vision Baseline Vision/History: Wears glasses Wears Glasses: Reading only Patient Visual Report: No change from baseline Vision Assessment?: Yes Eye Alignment: Within Functional Limits Ocular Range of Motion: Within Functional Limits Alignment/Gaze Preference: Within Defined Limits Tracking/Visual Pursuits: Able to track stimulus in all  quads without difficulty Saccades: Within functional limits Convergence: Within functional limits Visual Fields: No apparent deficits     Perception     Praxis      Pertinent Vitals/Pain Pain Assessment: No/denies pain     Hand Dominance Right   Extremity/Trunk Assessment Upper Extremity Assessment Upper Extremity Assessment: RUE deficits/detail RUE Deficits / Details: mild sensory motor inncoordination but functional RUE Coordination: decreased fine motor   Lower Extremity Assessment Lower Extremity Assessment: Overall WFL for tasks assessed   Cervical / Trunk Assessment Cervical / Trunk Assessment: Normal   Communication Communication Communication: No difficulties   Cognition Arousal/Alertness: Awake/alert Behavior During Therapy: WFL for tasks assessed/performed Overall Cognitive Status: Within Functional Limits for tasks assessed                                     General Comments  Daughter presetn for session    Exercises Exercises: Other exercises Other Exercises Other Exercises: fine motor and coordination handout   Shoulder Instructions      Home Living Family/patient expects to be discharged to:: Private residence Living Arrangements: Alone Available Help at Discharge: Family;Available PRN/intermittently Type of Home: House Home Access: Stairs to enter [employer] of  Steps: 2 Entrance Stairs-Rails: None Home Layout: Two level Alternate Level Stairs-Number of Steps: 13 Alternate Level Stairs-Rails: Can reach both Bathroom Shower/Tub: Occupational psychologist: Standard Bathroom Accessibility: Yes   Home Equipment: None          Prior Functioning/Environment Level of Independence: Independent        Comments: drives; babysits grandson        OT Problem List: Decreased coordination      OT Treatment/Interventions: Therapeutic exercise;Neuromuscular education;Patient/family education    OT  Goals(Current goals can be found in the care plan section) Acute Rehab OT Goals Patient Stated Goal: to babysit her grandson and return to volunteering OT Goal Formulation: With patient Time For Goal Achievement: 02/01/18 Potential to Achieve Goals: Good  OT Frequency: Min 2X/week   Barriers to D/C:            Co-evaluation              AM-PAC OT "6 Clicks" Daily Activity     Outcome Measure Help from another person eating meals?: None Help from another person taking care of personal grooming?: None Help from another person toileting, which includes using toliet, bedpan, or urinal?: None Help from another person bathing (including washing, rinsing, drying)?: None Help from another person to put on and taking off regular upper body clothing?: None Help from another person to put on and taking off regular lower body clothing?: None 6 Click Score: 24   End of Session Nurse Communication: Mobility status  Activity Tolerance: Patient tolerated treatment well Patient left: in bed;with call bell/phone within reach;with family/visitor present  OT Visit Diagnosis: Other (comment)(incoordination/sensory motor deficits)                Time: 0950-1008 OT Time Calculation (min): 18 min Charges:  OT General Charges $OT Visit: 1 Visit OT Evaluation $OT Eval Low Complexity: Clarissa, OT/L   Acute OT Clinical Specialist Acute Rehabilitation Services Pager [phone] Office [phone]   [facility] 01/25/2018, 10:14 AM

## 2018-01-25 NOTE — Discharge Summary (Signed)
Physician Discharge Summary  [name]:[id number] DOB: 04-04-1945 DOA: 01/24/2018  PCP: Deland Pretty, MD  Admit date: 01/24/2018 Discharge date: 01/25/2018  Admitted From: Home Disposition: Home  Recommendations for Outpatient Follow-up:  1. Follow up with PCP in 1-2 weeks 2. Follow up with Neurology 3. Follow up with Cardiology  4. Please obtain CMP/CBC, Mag, Phos in one week 5. Please follow up on the following pending results:  Home Health: No  Equipment/Devices: None    Discharge Condition: Stable  CODE STATUS: FULL CODE Diet recommendation: Heart Healthy Diet   Brief/Interim Summary: HPI per Dr. 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 She had been in her normal state of health until this morning. She is right-hand dominant and felt "wobbly"while trying to brush her teeth. Stated that she was unable to brush the back of her tongue like normal. Then later on while trying to write a check noticed that her penmanship was off. Patient reported similar symptoms with previous stroke back in 2007. She reports that she was a little shaky with ambulation. Denies any focal weakness, changes in vision, headache, falls, chest pain, or palpitations. Patient has been on Aggrenox since her previous stroke and takes it as advised. She is followed by Dr. Leonie Man of neurology in the outpatient setting.  ED Course:Upon admission into the emergency department patient was noted to be afebrile, blood pressures 155/74-194/73, and all other vital signs within normal limits. Labs revealed WBC 3.9, and all other labs within normal limits. MRI of the brain showed a 12 mm acute/early subacute infarct of the left posterior thalamus. Neurology have been consulted  and recommended completion of stroke work-up.  **Stroke work-up was complete and she had minimal deficits.  Allergy evaluated change patient to dual antiplatelet therapy with Plavix and aspirin.  PT and OT evaluated and she had no needs.  She was deemed medically stable to be discharged she will need to follow-up with primary care physician, neurology as well as cardiology in outpatient setting.  Discharge Diagnoses:  Principal Problem:   CVA (cerebral vascular accident) [facility]) Active Problems:   Aneurysm, cerebral, nonruptured   Essential hypertension   HLD (hyperlipidemia)   Anxiety  Acute Left Thalamic Ischemic CVA/Infarction as well asd Chronic White Matter Changes and Chronic Lacunar Infarctions -Acute/subacute. Patient presented with right hand "wobbliness". -MRI showing a 12 mm acute/early subacute infarct of the left posterior thalamus. -Patient follow-up by Dr. Leonie Man ofneurologyin the outpatientsetting and previously had been on Aggrenox cardiology.  -Neurology was consulted and recommendations as seen below. - Admitted to telemetry bed - Stroke order set initiated - Neuro checks - PT/OT/Speech to eval and treat and had no needs - Checked echocardiogram: Estimated EF in the range of 55 to 60% with trivial aortic valve regurgitation and no cardiac source of emboli that was identified - Checked Hemoglobin A1c (5.7) and lipid panel -Good panel showed a cholesterol level of 158, LDL of 95, HDL 53, triglycerides of 51, and VLDL of 10 -Neurology recommended changing the patient to dual platelet therapy with 81 mg of p.o. aspirin daily for [redacted] weeks along with Plavix for 3 weeks and then just Plavix alone -They also increased statin to 40 mg p.o. nightly - Appreciate neurology consultative services, will follow-upfor further recommendation -recommended follow-up with Dr. Leonie Man at discharge  Cerebral aneurysms: Patient with known  aneurysms of the left cavernosus carotid,  right periophthalmic, and supraclinoid.Patient was previously evaluated for need of coiling,but wanted to continue monitoring them for the time being. -Continue outpatient follow-up follow-up with Dr. Leonie Man in outpatient  Essential hypertension - Continuemetoprolol as out of permissive HTN time window  Hyperlipidemia -Panel showed a total cholesterol/HDL ratio of 3.0, cholesterol level of 150, HDL 53, LDL of 95, triglycerides of 51, and VLDL of 10 -Continue Zetia -Atorvastatinincrease to40mg  daily  Anxiety -Continue Xanax as needed for anxiety  IFG/Pre-Diabetes -Patient's HbA1x was 5.7 -Counseling given -Will need Lifestyle Modifications  Discharge Instructions  Discharge Instructions    Ambulatory referral to Neurology   Complete by:  As directed    An appointment is requested in approximately: 4 weeks. Pt is Dr. Clydene Fake pt in the past. Thanks.   Call MD for:  difficulty breathing, headache or visual disturbances   Complete by:  As directed    Call MD for:  extreme fatigue   Complete by:  As directed    Call MD for:  hives   Complete by:  As directed    Call MD for:  persistant dizziness or light-headedness   Complete by:  As directed    Call MD for:  persistant nausea and vomiting   Complete by:  As directed    Call MD for:  redness, tenderness, or signs of infection (pain, swelling, redness, odor or green/yellow discharge around incision site)   Complete by:  As directed    Call MD for:  severe uncontrolled pain   Complete by:  As directed    Call MD for:  temperature >100.4   Complete by:  As directed    Diet - low sodium heart healthy   Complete by:  As directed    Discharge instructions   Complete by:  As directed    You were cared for by a hospitalist during your hospital stay. If you have any questions about your discharge medications or the care you received while you were in the hospital after you are discharged, you can call the unit and ask to  speak with the hospitalist on call if the hospitalist that took care of you is not available. Once you are discharged, your primary care physician will handle any further medical issues. Please note that NO REFILLS for any discharge medications will be authorized once you are discharged, as it is imperative that you return to your primary care physician (or establish a relationship with a primary care physician if you do not have one) for your aftercare needs so that they can reassess your need for medications and monitor your lab values.  Follow up with PCP, Neurology, and Cardiology. Take all medications as prescribed. If symptoms change or worsen please return to the ED for evaluation   Increase activity slowly   Complete by:  As directed      Allergies as of 01/25/2018      Reactions   Flagyl [metronidazole] Itching, Swelling      Medication List    STOP taking these medications   dipyridamole-aspirin 200-25 MG 12hr capsule Commonly known as:  AGGRENOX     TAKE these medications   acetaminophen 500 MG tablet Commonly known as:  TYLENOL Take 500 mg by mouth every 6 (six) hours as needed for mild pain or moderate pain.   ALPRAZolam 1 MG tablet Commonly known as:  XANAX Take by mouth 2 (two) times daily as needed.   aspirin 81 MG EC  tablet Take 1 tablet (81 mg total) by mouth daily. Start taking on:  January 26, 2018   atorvastatin 40 MG tablet Commonly known as:  LIPITOR Take 1 tablet (40 mg total) by mouth daily at 6 PM. What changed:    medication strength  how much to take   brimonidine 0.15 % ophthalmic solution Commonly known as:  ALPHAGAN Place 1 drop into both eyes 2 (two) times daily.   clopidogrel 75 MG tablet Commonly known as:  PLAVIX Take 1 tablet (75 mg total) by mouth daily. Start taking on:  January 26, 2018   ezetimibe 10 MG tablet Commonly known as:  ZETIA Take 10 mg by mouth at bedtime.   latanoprost 0.005 % ophthalmic solution Commonly known  as:  XALATAN Place 1 drop into both eyes at bedtime.   Metoprolol Succinate 25 MG Cs24 Take 25 mg by mouth daily.   senna-docusate 8.6-50 MG tablet Commonly known as:  Senokot-S Take 1 tablet by mouth at bedtime as needed for mild constipation.      Follow-up Information    Garvin Fila, MD. Schedule an appointment as soon as possible for a visit in 4 week(s).   Specialties:  Neurology, Radiology Contact information: [address] [place] [address] [phone]          Allergies  Allergen Reactions  . Flagyl [Metronidazole] Itching and Swelling   Consultations:  Neurology  Procedures/Studies: Ct Head Wo Contrast  Result Date: 01/24/2018 CLINICAL DATA:  Pt c/o not feeling herself and having numbness in her hands EXAM: CT HEAD WITHOUT CONTRAST TECHNIQUE: Contiguous axial images were obtained from the base of the skull through the vertex without intravenous contrast. COMPARISON:  12/03/2017 FINDINGS: Brain: No evidence of acute infarction, hemorrhage, extra-axial collection, ventriculomegaly, or mass effect. Generalized cerebral atrophy. Periventricular white matter low attenuation likely secondary to microangiopathy. Vascular: Cerebrovascular atherosclerotic calcifications are noted. Skull: Negative for fracture or focal lesion. Sinuses/Orbits: Visualized portions of the orbits are unremarkable. Visualized portions of the paranasal sinuses and mastoid air cells are unremarkable. Other: None. IMPRESSION: 1. No acute intracranial pathology. 2. Chronic microvascular disease and cerebral atrophy. Electronically Signed   By: Kathreen Devoid   On: 01/24/2018 12:51   Mr Brain Wo Contrast (neuro Protocol)  Addendum Date: 01/24/2018   ADDENDUM REPORT: 01/24/2018 19:52 ADDENDUM: These results were called by telephone at the time of interpretation on 01/24/2018 at 7:51 pm to Dr. Jeanell Sparrow, who verbally acknowledged these results. Electronically Signed   By: Kristine Garbe M.D.   On: 01/24/2018 19:52   Result Date: 01/24/2018 CLINICAL DATA:  72 y/o  F; right hand numbness. EXAM: MRI HEAD WITHOUT CONTRAST TECHNIQUE: Multiplanar, multiecho pulse sequences of the brain and surrounding structures were obtained without intravenous contrast. COMPARISON:  01/24/2018 CT head.  12/01/2017 MRI head. FINDINGS: Brain: Focus of reduced diffusion within the left posterior thalamus measuring 12 x 7 mm (series 3, image 33) compatible with acute/early subacute infarction. No associated hemorrhage or mass effect. Stable nonspecific T2 FLAIR hyperintensities in subcortical and periventricular white matter are compatible with chronic microvascular ischemic changes. Stable volume loss of the brain. No focal mass effect, extra-axial collection, hydrocephalus, or herniation. Stable small chronic lacunar infarct within the left lentiform nucleus. Few nonspecific punctate foci of susceptibility hypointensity are compatible with chronic microhemorrhage. Vascular: Normal flow voids. Skull and upper cervical spine: Normal marrow signal. Sinuses/Orbits: Negative. Other: None. IMPRESSION: 1. 12 mm acute/early subacute infarction within left posterior thalamus. No hemorrhage  or mass effect. 2. Stable mild-to-moderate chronic microvascular ischemic changes and volume loss of the brain for age. These results will be called to the ordering clinician or representative by the Radiologist Assistant, and communication documented in the PACS or zVision Dashboard. Electronically Signed: By: Kristine Garbe M.D. On: 01/24/2018 19:41   Vas US Carotid  Result Date: 01/25/2018 Carotid Arterial Duplex Study Indications: CVA. Performing Technologist: Abram Sander RVS  Examination Guidelines: A complete evaluation includes B-mode imaging, spectral Doppler, color Doppler, and power Doppler as needed of all accessible portions of each vessel. Bilateral testing is considered an integral part of a  complete examination. Limited examinations for reoccurring indications may be performed as noted.  Right Carotid Findings: +----------+--------+--------+--------+------------+--------+           PSV cm/sEDV cm/sStenosisDescribe    Comments +----------+--------+--------+--------+------------+--------+ CCA Prox  64      7               heterogenous         +----------+--------+--------+--------+------------+--------+ CCA Distal65      10              heterogenous         +----------+--------+--------+--------+------------+--------+ ICA Prox  47      7       1-39%   heterogenous         +----------+--------+--------+--------+------------+--------+ ICA Distal93      19                                   +----------+--------+--------+--------+------------+--------+ ECA       68                                           +----------+--------+--------+--------+------------+--------+ +----------+--------+-------+--------+-------------------+           PSV cm/sEDV cmsDescribeArm Pressure (mmHG) +----------+--------+-------+--------+-------------------+ [id number]                                        +----------+--------+-------+--------+-------------------+ +---------+--------+--+--------+-+---------+ VertebralPSV cm/s49EDV cm/s9Antegrade +---------+--------+--+--------+-+---------+  Left Carotid Findings: +----------+--------+--------+--------+------------+--------+           PSV cm/sEDV cm/sStenosisDescribe    Comments +----------+--------+--------+--------+------------+--------+ CCA Prox  71      9               heterogenous         +----------+--------+--------+--------+------------+--------+ CCA Distal94      14              heterogenous         +----------+--------+--------+--------+------------+--------+ ICA Prox  42      8       1-39%   heterogenous         +----------+--------+--------+--------+------------+--------+ ICA  Distal91      20                                   +----------+--------+--------+--------+------------+--------+ ECA       57                                           +----------+--------+--------+--------+------------+--------+ +----------+--------+--------+--------+-------------------+  SubclavianPSV cm/sEDV cm/sDescribeArm Pressure (mmHG) +----------+--------+--------+--------+-------------------+           107                                         +----------+--------+--------+--------+-------------------+ +---------+--------+--+--------+--+---------+ VertebralPSV cm/s70EDV cm/s14Antegrade +---------+--------+--+--------+--+---------+  Summary: Right Carotid: Velocities in the right ICA are consistent with a 1-39% stenosis. Left Carotid: Velocities in the left ICA are consistent with a 1-39% stenosis. Vertebrals: Bilateral vertebral arteries demonstrate antegrade flow. *See table(s) above for measurements and observations.  Electronically signed by Servando Snare MD on 01/25/2018 at 5:08:24 PM.    Final    ECHOCARDIOGRAM ------------------------------------------------------------------- Study Conclusions  - Left ventricle: The cavity size was normal. Systolic function was   normal. The estimated ejection fraction was in the range of 55%   to 60%. Wall motion was normal; there were no regional wall   motion abnormalities. - Aortic valve: There was trivial regurgitation. - Atrial septum: No defect or patent foramen ovale was identified. - Pulmonary arteries: Systolic pressure was mildly to moderately   increased. PA peak pressure: 32 mm Hg (S).  Impressions:  - No cardiac source of emboli was indentified.  VASCULAR U/S CAROTID Summary: Right Carotid: Velocities in the right ICA are consistent with a 1-39% stenosis.  Left Carotid: Velocities in the left ICA are consistent with a 1-39% stenosis.  Vertebrals: Bilateral vertebral arteries demonstrate  antegrade flow.  Subjective: Examined at bedside was doing well.  Denying chest pain, lightheadedness or dizziness.  States her head and wobbliness was improved today.  No other concerns or complaints at this time and was ready to go home as she did not have any needs from therapy.  Discharge Exam: Vitals:   01/25/18 1152 01/25/18 1548  BP: (!) 147/80 137/61  Pulse: 66 60  Resp: 18 18  Temp: 98.1 F (36.7 C) 98.8 F (37.1 C)  SpO2: 100% 100%   Vitals:   01/25/18 0433 01/25/18 0751 01/25/18 1152 01/25/18 1548  BP: 139/68 (!) 142/67 (!) 147/80 137/61  Pulse: 66 62 66 60  Resp: 18 18 18 18   Temp: 98.5 F (36.9 C) 98.8 F (37.1 C) 98.1 F (36.7 C) 98.8 F (37.1 C)  TempSrc: Oral Oral Axillary Oral  SpO2: 95% 99% 100% 100%  Weight:      Height:       General: Pt is alert, awake, not in acute distress Cardiovascular: RRR, S1/S2 +, no rubs, no gallops Respiratory: CTA bilaterally, no wheezing, no rhonchi Abdominal: Soft, NT, ND, bowel sounds + Extremities: no edema, no cyanosis  The results of significant diagnostics from this hospitalization (including imaging, microbiology, ancillary and laboratory) are listed below for reference.    Microbiology: No results found for this or any previous visit (from the past 240 hour(s)).   Labs: BNP (last 3 results) No results for input(s): BNP in the last 8760 hours. Basic Metabolic Panel: Recent Labs  Lab 01/24/18 1145 01/24/18 1206 01/25/18 0440  NA 140 140 142  K 3.8 3.7 3.8  CL 107 107 111  CO2 23  --  24  GLUCOSE 113* 111* 94  BUN 10 12 12   CREATININE 0.73 0.60 0.57  CALCIUM 9.3  --  9.2   Liver Function Tests: Recent Labs  Lab 01/24/18 1145  AST 26  ALT 17  ALKPHOS 60  BILITOT 0.7  PROT 7.2  ALBUMIN 3.8  No results for input(s): LIPASE, AMYLASE in the last 168 hours. No results for input(s): AMMONIA in the last 168 hours. CBC: Recent Labs  Lab 01/24/18 1145 01/24/18 1206 01/25/18 0440  WBC 3.9*  --   4.8  NEUTROABS 2.3  --   --   HGB 12.1 12.9 11.6*  HCT 38.5 38.0 35.3*  MCV 95.5  --  91.9  PLT 265  --  254   Cardiac Enzymes: No results for input(s): CKTOTAL, CKMB, CKMBINDEX, TROPONINI in the last 168 hours. BNP: Invalid input(s): POCBNP CBG: Recent Labs  Lab 01/24/18 1154  GLUCAP 101*   D-Dimer No results for input(s): DDIMER in the last 72 hours. Hgb A1c Recent Labs    01/25/18 0440  HGBA1C 5.7*   Lipid Profile Recent Labs    01/25/18 0440  CHOL 158  HDL 53  LDLCALC 95  TRIG 51  CHOLHDL 3.0   Thyroid function studies No results for input(s): TSH, T4TOTAL, T3FREE, THYROIDAB in the last 72 hours.  Invalid input(s): FREET3 Anemia work up No results for input(s): VITAMINB12, FOLATE, FERRITIN, TIBC, IRON, RETICCTPCT in the last 72 hours. Urinalysis    Component Value Date/Time   COLORURINE YELLOW 08/18/2014 [place] 08/18/2014 1235   LABSPEC 1.017 08/18/2014 1235   PHURINE 7.0 08/18/2014 1235   GLUCOSEU NEGATIVE 08/18/2014 1235   HGBUR NEGATIVE 08/18/2014 1235   BILIRUBINUR NEGATIVE 08/18/2014 1235   BILIRUBINUR neg 08/25/2011 1011   KETONESUR NEGATIVE 08/18/2014 1235   PROTEINUR 30 (A) 08/18/2014 1235   UROBILINOGEN 0.2 08/18/2014 1235   NITRITE NEGATIVE 08/18/2014 1235   LEUKOCYTESUR NEGATIVE 08/18/2014 1235   Sepsis Labs Invalid input(s): PROCALCITONIN,  WBC,  LACTICIDVEN Microbiology No results found for this or any previous visit (from the past 240 hour(s)).  Time coordinating discharge: 35 minutes  SIGNED:  Kerney Elbe, DO [facility] 01/25/2018, 7:37 PM Pager is on [place]  If 7PM-7AM, please contact night-coverage [email or web address] Password [email or web address]

## 2018-01-25 NOTE — Progress Notes (Addendum)
STROKE TEAM PROGRESS NOTE   INTERVAL HISTORY No family is at the bedside.  She is a long-term pt of Dr. Leonie Man and Dr. Terrence Dupont (multiple Holter monitors unrevealing). Has new stroke but no therapy needs. Changed Aggrenox to DAPT x 3 weeks. Continue to monitor stable aneursyms as an OP. Anticipate d/c once 2D resulted (if insignificant). Discussed with pt.   Vitals:   01/24/18 1812 01/25/18 0057 01/25/18 0433 01/25/18 0751  BP: (!) 158/69 (!) 155/74 139/68 (!) 142/67  Pulse: 72 84 66 62  Resp: 16 20 18 18   Temp:  98.6 F (37 C) 98.5 F (36.9 C) 98.8 F (37.1 C)  TempSrc:  Oral Oral Oral  SpO2: 100% 100% 95% 99%  Weight:      Height:        CBC:  Recent Labs  Lab 01/24/18 1145 01/24/18 1206 01/25/18 0440  WBC 3.9*  --  4.8  NEUTROABS 2.3  --   --   HGB 12.1 12.9 11.6*  HCT 38.5 38.0 35.3*  MCV 95.5  --  91.9  PLT 265  --  976    Basic Metabolic Panel:  Recent Labs  Lab 01/24/18 1145 01/24/18 1206 01/25/18 0440  NA 140 140 142  K 3.8 3.7 3.8  CL 107 107 111  CO2 23  --  24  GLUCOSE 113* 111* 94  BUN 10 12 12   CREATININE 0.73 0.60 0.57  CALCIUM 9.3  --  9.2   Lipid Panel:     Component Value Date/Time   CHOL 158 01/25/2018 0440   TRIG 51 01/25/2018 0440   HDL 53 01/25/2018 0440   CHOLHDL 3.0 01/25/2018 0440   VLDL 10 01/25/2018 0440   LDLCALC 95 01/25/2018 0440   HgbA1c:  Lab Results  Component Value Date   HGBA1C 5.7 (H) 01/25/2018   Urine Drug Screen:     Component Value Date/Time   LABOPIA NONE DETECTED 08/18/2014 1235   COCAINSCRNUR NONE DETECTED 08/18/2014 1235   LABBENZ NONE DETECTED 08/18/2014 1235   AMPHETMU NONE DETECTED 08/18/2014 1235   THCU NONE DETECTED 08/18/2014 1235   LABBARB NONE DETECTED 08/18/2014 1235    Alcohol Level     Component Value Date/Time   ETH <10 06/29/2017 1242    IMAGING Ct Head Wo Contrast  Result Date: 01/24/2018 CLINICAL DATA:  Pt c/o not feeling herself and having numbness in her hands EXAM: CT HEAD  WITHOUT CONTRAST TECHNIQUE: Contiguous axial images were obtained from the base of the skull through the vertex without intravenous contrast. COMPARISON:  12/03/2017 FINDINGS: Brain: No evidence of acute infarction, hemorrhage, extra-axial collection, ventriculomegaly, or mass effect. Generalized cerebral atrophy. Periventricular white matter low attenuation likely secondary to microangiopathy. Vascular: Cerebrovascular atherosclerotic calcifications are noted. Skull: Negative for fracture or focal lesion. Sinuses/Orbits: Visualized portions of the orbits are unremarkable. Visualized portions of the paranasal sinuses and mastoid air cells are unremarkable. Other: None. IMPRESSION: 1. No acute intracranial pathology. 2. Chronic microvascular disease and cerebral atrophy. Electronically Signed   By: Kathreen Devoid   On: 01/24/2018 12:51   Mr Brain Wo Contrast (neuro Protocol)  Addendum Date: 01/24/2018   ADDENDUM REPORT: 01/24/2018 19:52 ADDENDUM: These results were called by telephone at the time of interpretation on 01/24/2018 at 7:51 pm to Dr. Jeanell Sparrow, who verbally acknowledged these results. Electronically Signed   By: Kristine Garbe M.D.   On: 01/24/2018 19:52   Result Date: 01/24/2018 CLINICAL DATA:  72 y/o  F; right hand numbness. EXAM: MRI  HEAD WITHOUT CONTRAST TECHNIQUE: Multiplanar, multiecho pulse sequences of the brain and surrounding structures were obtained without intravenous contrast. COMPARISON:  01/24/2018 CT head.  12/01/2017 MRI head. FINDINGS: Brain: Focus of reduced diffusion within the left posterior thalamus measuring 12 x 7 mm (series 3, image 33) compatible with acute/early subacute infarction. No associated hemorrhage or mass effect. Stable nonspecific T2 FLAIR hyperintensities in subcortical and periventricular white matter are compatible with chronic microvascular ischemic changes. Stable volume loss of the brain. No focal mass effect, extra-axial collection, hydrocephalus, or  herniation. Stable small chronic lacunar infarct within the left lentiform nucleus. Few nonspecific punctate foci of susceptibility hypointensity are compatible with chronic microhemorrhage. Vascular: Normal flow voids. Skull and upper cervical spine: Normal marrow signal. Sinuses/Orbits: Negative. Other: None. IMPRESSION: 1. 12 mm acute/early subacute infarction within left posterior thalamus. No hemorrhage or mass effect. 2. Stable mild-to-moderate chronic microvascular ischemic changes and volume loss of the brain for age. These results will be called to the ordering clinician or representative by the Radiologist Assistant, and communication documented in the PACS or zVision Dashboard. Electronically Signed: By: Kristine Garbe M.D. On: 01/24/2018 19:41   Carotid Doppler   There is 1-39% bilateral ICA stenosis. Vertebral artery flow is antegrade.   2D Echocardiogram  - Left ventricle: The cavity size was normal. Systolic function was normal. The estimated ejection fraction was in the range of 55% to 60%. Wall motion was normal; there were no regional wall motion abnormalities. - Aortic valve: There was trivial regurgitation. - Atrial septum: No defect or patent foramen ovale was identified. - Pulmonary arteries: Systolic pressure was mildly to moderately increased. PA peak pressure: 32 mm Hg (S). Impressions:  No cardiac source of emboli was indentified.   PHYSICAL EXAM HEENT: [place]/AT Lungs: Respirations unlabored Heart: rate regular  Neurologic Examination: Mental Status: Alert, oriented, thought content appropriate.  Speech fluent without evidence of aphasia.  Able to follow all commands without difficulty. Cranial Nerves: II:  Visual fields intact. PERRL.  III,IV, VI: EOMI. No ptosis. No nystagmus.   V,VII: Facial temp sensation equal bilaterally. No facial droop.  VIII: hearing intact to voice IX,X: No hypophonia or hoarseness XI: Symmetric XII: midline tongue extension   Motor: RUE: 5/5 RLE: 5/5 LUE and LLE: 5/5 No drift on the right today Sensory: Temp and FT intact x 4 Deep Tendon Reflexes:  2+ bilateral upper and lower extremities Plantars: Equivocal bilaterally  Cerebellar: No ataxia with FNF bilaterally Gait: Deferred   ASSESSMENT/PLAN [name] is a 72 y.o. 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 Small vessel disease. Atrophy.   MRI  L posterior thalamic infarct. Small vessel disease. Atrophy.   CTA head 11/2017 R P2 irreg mod to severe stenosis. Mod R ICA cavernous stenosis increased since 2017. Stable mild L ICA siphon stenosis. 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 No source of embolus   LDL 95  HgbA1c 5.7  Lovenox 40 mg sq daily for VTE prophylaxis  dipyridamole SR 250 mg/aspirin 25 mg twice a day prior to admission, now on aspirin 81 mg daily and clopidogrel 75 mg daily. Continue DAPT x 3  weeks then PLAVIX alone.  Therapy recommendations:  No therapy needs  Disposition:  Return home  Hypertension  Stable . Permissive hypertension (OK if < 220/120) but gradually normalize in 5-7 days . 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 Diona Foley FURTHER TO ADD FROM THE STROKE STANDPOINT  Patient has a 10-15% risk of having another stroke over the next year, the highest risk is within 2 weeks of the most recent stroke/TIA (risk of having a stroke following a stroke or TIA is the same).  Ongoing risk factor control by Primary Care Physician/cardiologist Terrence Dupont)  Stroke Service will sign off. Please call should any needs arise.  Follow-up Stroke Clinic at [facility] in 4 weeks, order placed.   Hospital day # 1  Burnetta Sabin, MSN, APRN, ANVP-BC, AGPCNP-BC Advanced Practice Stroke Nurse Gage Burgin for Schedule & Pager information 01/25/2018 10:18 AM   ATTENDING NOTE: I reviewed above note and agree with the assessment and plan. Pt was seen and examined.   72 year old female with history of hypertension, left BG infarct 04/2005 on Aggrenox, cerebral aneurysm admitted for right hand numbness and discoordination.  MRI showed left thalamic infarct.  Carotid Doppler and 2D echo unremarkable.  LDL 95 and A1c 5.7.  UDS pending  Patient stroke most likely small vessel disease given risk factors.  She is on Aggrenox and that he 10 and Lipitor 20 at home.  Will recommend aspirin 81 and Plavix 75 DAPT for 3 weeks and then Plavix alone.  Increase Lipitor 40 continue with Zetia.  Stroke risk factor modification and close clinic follow-up.  Patient had left cavernous ICA 8 mm aneurysm and right 2 to 3 mm paraophthalmic aneurysm, which are all stable over the years and the last CTA 11/2017.  She will continue follow-up with Dr. Leonie Man at [facility].  Neurology will sign off. Please call with questions. Pt will follow up with stroke clinic Dr. Leonie Man at [facility] in about 4 weeks. Thanks for the consult.   Rosalin Hawking, MD PhD Stroke  Neurology 01/25/2018 6:40 PM     To contact Stroke Continuity provider, please refer to [email or web address] After hours, contact General Neurology

## 2018-01-27 DIAGNOSIS — M1990 Unspecified osteoarthritis, unspecified site: Secondary | ICD-10-CM | POA: Diagnosis not present

## 2018-01-27 DIAGNOSIS — Z8673 Personal history of transient ischemic attack (TIA), and cerebral infarction without residual deficits: Secondary | ICD-10-CM | POA: Diagnosis not present

## 2018-01-27 DIAGNOSIS — E785 Hyperlipidemia, unspecified: Secondary | ICD-10-CM | POA: Diagnosis not present

## 2018-01-27 DIAGNOSIS — I10 Essential (primary) hypertension: Secondary | ICD-10-CM | POA: Diagnosis not present

## 2018-01-27 NOTE — Consult Note (Signed)
            [facility] CM Primary Care Navigator  01/27/2018  [name] 1945-10-02 [id number]   Attempt toseepatient at the bedsideto identify possible discharge needs but she was alreadydischargedhome.   PerMD note, patientpresented with complaints of not feeling quite like her normal self and was shaky with ambulation. Neurology consulted for stroke work-up. (CVA- cerebral vascular accident, cerebral aneurysm, hyperlipidemia)   Patient hasdischarge instruction to follow-up withprimary care provider in 1- 2 weeks. Primary care provider's officeis listed asprovidingtransition of care (TOC).  Noted order for EMMI Stroke calls in place to follow-up recovery post discharge.   For additional questions please contact:  Edwena Felty A. Samel Bruna, BSN, RN-BC [facility] PRIMARY CARE Navigator Cell: [phone]

## 2018-02-02 ENCOUNTER — Other Ambulatory Visit (HOSPITAL_COMMUNITY): Payer: Self-pay | Admitting: Internal Medicine

## 2018-02-04 DIAGNOSIS — Z803 Family history of malignant neoplasm of breast: Secondary | ICD-10-CM | POA: Diagnosis not present

## 2018-02-04 DIAGNOSIS — Z1231 Encounter for screening mammogram for malignant neoplasm of breast: Secondary | ICD-10-CM | POA: Diagnosis not present

## 2018-02-11 ENCOUNTER — Other Ambulatory Visit: Payer: Self-pay

## 2018-02-11 ENCOUNTER — Telehealth: Payer: Self-pay | Admitting: Neurology

## 2018-02-11 MED ORDER — CLOPIDOGREL BISULFATE 75 MG PO TABS: 75.0000 mg | ORAL_TABLET | Freq: Every day | ORAL | 0 refills | Status: DC

## 2018-02-11 NOTE — Telephone Encounter (Signed)
Plavix [facility] for 3 months. Pt has an appt in February 2020.

## 2018-02-11 NOTE — Telephone Encounter (Signed)
Pt states that she had a mini stroke and went to the ED on Dec. 12, 2019. Pt states she was taken off her old medication and put on clopidogrel (PLAVIX) 75 MG tablet She does not have an appt until 03/22/18 and will be running out before then and would like to know if it could be called in to her [facility] Please advise.

## 2018-02-19 ENCOUNTER — Emergency Department (HOSPITAL_COMMUNITY)
Admission: EM | Admit: 2018-02-19 | Discharge: 2018-02-20 | Disposition: A | Discharge status: Home / Self Care | Payer: Medicare Other | Attending: Emergency Medicine | Admitting: Emergency Medicine

## 2018-02-19 ENCOUNTER — Other Ambulatory Visit: Payer: Self-pay

## 2018-02-19 ENCOUNTER — Encounter (HOSPITAL_COMMUNITY): Payer: Self-pay | Admitting: *Deleted

## 2018-02-19 DIAGNOSIS — Z79899 Other long term (current) drug therapy: Secondary | ICD-10-CM | POA: Insufficient documentation

## 2018-02-19 DIAGNOSIS — I10 Essential (primary) hypertension: Secondary | ICD-10-CM | POA: Diagnosis not present

## 2018-02-19 DIAGNOSIS — Z7982 Long term (current) use of aspirin: Secondary | ICD-10-CM | POA: Insufficient documentation

## 2018-02-19 DIAGNOSIS — R002 Palpitations: Secondary | ICD-10-CM | POA: Diagnosis not present

## 2018-02-19 MED ORDER — SODIUM CHLORIDE 0.9% FLUSH: 3.0000 mL | Freq: Once | INTRAVENOUS | Status: DC

## 2018-02-19 NOTE — ED Triage Notes (Addendum)
Pt reports that about an hour ago she felt like her heart was racing for about 5 minutes, she took her bp at it was 210/94. She has been taking her prescribed bp meds. She said she felt a "pounding" sensation in her heart.  Pt denies having any pain, no shortness, dizziness, back pain.

## 2018-02-20 ENCOUNTER — Emergency Department (HOSPITAL_COMMUNITY): Payer: Medicare Other

## 2018-02-20 DIAGNOSIS — R002 Palpitations: Secondary | ICD-10-CM | POA: Diagnosis not present

## 2018-02-20 LAB — BASIC METABOLIC PANEL
Anion gap: 7 (ref 5–15)
BUN: 15 mg/dL (ref 8–23)
CO2: 25 mmol/L (ref 22–32)
CREATININE: 0.66 mg/dL (ref 0.44–1.00)
Calcium: 9.3 mg/dL (ref 8.9–10.3)
Chloride: 108 mmol/L (ref 98–111)
GFR calc Af Amer: >60 mL/min (ref >60)
GFR calc non Af Amer: >60 mL/min (ref >60)
Glucose, Bld: 100 mg/dL — ABNORMAL HIGH (ref 70–99)
Potassium: 3.7 mmol/L (ref 3.5–5.1)
Sodium: 140 mmol/L (ref 135–145)

## 2018-02-20 LAB — CBC
HCT: 38.4 % (ref 36.0–46.0)
Hemoglobin: 12.3 g/dL (ref 12.0–15.0)
MCH: 29.9 pg (ref 26.0–34.0)
MCHC: 32 g/dL (ref 30.0–36.0)
MCV: 93.4 fL (ref 80.0–100.0)
Platelets: 220 10*3/uL (ref 150–400)
RBC: 4.11 MIL/uL (ref 3.87–5.11)
RDW: 13.7 % (ref 11.5–15.5)
WBC: 5.6 10*3/uL (ref 4.0–10.5)
nRBC: 0 % (ref 0.0–0.2)

## 2018-02-20 LAB — TROPONIN I
Troponin I: <0.03 ng/mL (ref <0.03)
Troponin I: <0.03 ng/mL (ref <0.03)
Troponin I: <0.03 ng/mL (ref <0.03)

## 2018-02-20 MED ORDER — AMLODIPINE BESYLATE 5 MG PO TABS
2.5000 mg | ORAL_TABLET | Freq: Once | ORAL | Status: AC
  Administered 2018-02-20: 2.5 mg via ORAL
  Filled 2018-02-20: qty 1

## 2018-02-20 NOTE — ED Provider Notes (Signed)
[facility] EMERGENCY DEPARTMENT Provider Note   CSN: [id number] Arrival date & time: 02/19/18  2315     History   Chief Complaint Chief Complaint  Patient presents with  . Palpitations    HPI [name] is a 73 y.o. female.  Patient presents with episode of palpitations and racing heart that onset about 11pm.  This was shortly after she went up some steps.  She rested for about 5 minutes and the palpitations went away.  She never did have any chest pain, shortness of breath, dizziness or lightheadedness.  Did have slight nausea.  She is never had this kind of episode in the past.  History of "irregular heartbeat" but no atrial fibrillation.  She reports she had a stroke about a month ago and was prescribed metoprolol for her blood pressure which she has been taking 50 mg at night.  She is also prescribed amlodipine 2.5 mg which she has not been taking because her blood pressure at home is been in the 638-756 range systolic.  States she stopped taking the amlodipine on her own when her BP was in 110s. When she checked her blood pressure tonight it was 210/94.  She denies any headache, chest pain, shortness of breath, focal weakness, numbness or tingling.  No vertigo or spinning sensation.  The history is provided by the patient.  Palpitations  Associated symptoms: no back pain, no chest pain, no cough, no dizziness, no nausea, no shortness of breath, no vomiting and no weakness     Past Medical History:  Diagnosis Date  . Anemia   . Cystic breast   . Cystic breast   . History of chicken pox   . History of measles   . History of mumps   . Hypertension   . Stroke [facility])    TIA  . Trichomonas   . Trichomonas   . Yeast infection     Patient Active Problem List   Diagnosis Date Noted  . Essential hypertension 01/25/2018  . HLD (hyperlipidemia) 01/25/2018  . Anxiety 01/25/2018  . CVA (cerebral vascular accident) ([place]) 01/24/2018  . Disorder of  shoulder 03/18/2017  . Pain in joint of right shoulder 03/18/2017  . TIA (transient ischemic attack) 09/20/2015  . DDD (degenerative disc disease), cervical 09/20/2015  . History of stroke 09/20/2015  . Aneurysm, cerebral, nonruptured 03/28/2015  . Bilateral leg pain 10/26/2011  . Edema of lower extremity 10/26/2011  . Pelvic pain in female 08/25/2011  . Yeast infection   . Trichomonas   . Cystic breast   . Stroke ([place])   . History of chicken pox   . History of measles   . History of mumps     Past Surgical History:  Procedure Laterality Date  . DILATION AND CURETTAGE OF UTERUS    . excision of fibroadenoma       OB History    Gravida  2   Para  2   Term  2   Preterm      AB      Living  2     SAB      TAB      Ectopic      Multiple      Live Births  2            Home Medications    Prior to Admission medications   Medication Sig Start Date End Date Taking? Authorizing Provider  acetaminophen (TYLENOL) 500 MG tablet Take 500 mg  by mouth every 6 (six) hours as needed for mild pain or moderate pain.     [provider]  ALPRAZolam Duanne Moron) 1 MG tablet Take by mouth 2 (two) times daily as needed.  11/19/15   [provider]  aspirin EC 81 MG EC tablet Take 1 tablet (81 mg total) by mouth daily. 01/26/18   Raiford Noble Latif, DO  atorvastatin (LIPITOR) 40 MG tablet Take 1 tablet (40 mg total) by mouth daily at 6 PM. 01/25/18   Sheikh, Georgina Quint Latif, DO  brimonidine (ALPHAGAN) 0.15 % ophthalmic solution Place 1 drop into both eyes 2 (two) times daily.     [provider]  clopidogrel (PLAVIX) 75 MG tablet Take 1 tablet (75 mg total) by mouth daily. 02/11/18   Garvin Fila, MD  ezetimibe (ZETIA) 10 MG tablet Take 10 mg by mouth at bedtime.  11/23/17   [provider]  latanoprost (XALATAN) 0.005 % ophthalmic solution Place 1 drop into both eyes at bedtime. 02/25/14   [provider]  Metoprolol Succinate 25 MG CS24  Take 25 mg by mouth daily.     [provider]  senna-docusate (SENOKOT-S) 8.6-50 MG tablet Take 1 tablet by mouth at bedtime as needed for mild constipation. 01/25/18   Kerney Elbe, DO    Family History Family History  Problem Relation Age of Onset  . Cancer Mother   . Cancer Father   . Deep vein thrombosis Sister   . Diabetes Sister   . Hyperlipidemia Sister   . Diabetes Daughter   . Hyperlipidemia Daughter     Social History Social History   Tobacco Use  . Smoking status: Never Smoker  . Smokeless tobacco: Never Used  Substance Use Topics  . Alcohol use: No    Alcohol/week: 0.0 standard drinks  . Drug use: No     Allergies   Flagyl [metronidazole]   Review of Systems Review of Systems  Constitutional: Negative for activity change, appetite change and fever.  HENT: Negative for congestion and rhinorrhea.   Respiratory: Negative for cough, chest tightness and shortness of breath.   Cardiovascular: Positive for palpitations. Negative for chest pain.  Gastrointestinal: Negative for abdominal pain, nausea and vomiting.  Genitourinary: Negative for dysuria, hematuria, vaginal bleeding and vaginal discharge.  Musculoskeletal: Negative for back pain and neck pain.  Skin: Negative for rash.  Neurological: Negative for dizziness, weakness, light-headedness and headaches.     all other systems are negative except as noted in the HPI and PMH.    Physical Exam Updated Vital Signs BP (!) 173/87 (BP Location: Right Arm)   Pulse 80   Temp 98.1 F (36.7 C) (Oral)   Resp (!) 22   Ht 5\' 6"  (1.676 m)   Wt 72.6 kg   SpO2 99%   BMI 25.82 kg/m   Physical Exam Vitals signs and nursing note reviewed.  Constitutional:      General: She is not in acute distress.    Appearance: Normal appearance. She is well-developed. She is not ill-appearing.  HENT:     Head: Normocephalic and atraumatic.     Mouth/Throat:     Pharynx: No oropharyngeal exudate.  Eyes:       Conjunctiva/sclera: Conjunctivae normal.     Pupils: Pupils are equal, round, and reactive to light.  Neck:     Musculoskeletal: Normal range of motion and neck supple.     Comments: No meningismus. Cardiovascular:     Rate and Rhythm: Normal  rate and regular rhythm.     Heart sounds: Normal heart sounds. No murmur.  Pulmonary:     Effort: Pulmonary effort is normal. No respiratory distress.     Breath sounds: Normal breath sounds.  Abdominal:     Palpations: Abdomen is soft.     Tenderness: There is no abdominal tenderness. There is no guarding or rebound.  Musculoskeletal: Normal range of motion.        General: No tenderness.  Skin:    General: Skin is warm.  Neurological:     Mental Status: She is alert and oriented to person, place, and time.     Cranial Nerves: No cranial nerve deficit.     Motor: No abnormal muscle tone.     Coordination: Coordination normal.     Comments: CN 2-12 intact, no ataxia on finger to nose, no nystagmus, 5/5 strength throughout, no pronator drift, Romberg negative, normal gait.   Psychiatric:        Behavior: Behavior normal.      ED Treatments / Results  Labs (all labs ordered are listed, but only abnormal results are displayed) Labs Reviewed  BASIC METABOLIC PANEL - Abnormal; Notable for the following components:      Result Value   Glucose, Bld 100 (*)    All other components within normal limits  CBC  TROPONIN I  TROPONIN I  TROPONIN I    EKG EKG Interpretation  Date/Time:  Saturday February 19 2018 23:27:21 EST Ventricular Rate:  74 PR Interval:  182 QRS Duration: 86 QT Interval:  384 QTC Calculation: 426 R Axis:   34 Text Interpretation:  Normal sinus rhythm with sinus arrhythmia Biatrial enlargement Abnormal ECG No significant change was found Confirmed by Ezequiel Essex [phone]) on 02/19/2018 11:38:14 PM   Radiology Dg Chest 2 View  Result Date: 02/20/2018 CLINICAL DATA:  Palpitations EXAM: CHEST - 2 VIEW  COMPARISON:  11/12/2017 FINDINGS: Stable cardiomegaly with aortic atherosclerosis. Lungs are clear. No acute osseous abnormality is identified. 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 Mild degenerative change noted of the midthoracic spine. IMPRESSION: Stable cardiomegaly with aortic atherosclerosis. No active pulmonary disease. Electronically Signed   By: Ashley Royalty M.D.   On: 02/20/2018 00:45    Procedures Procedures (including critical care time)  Medications Ordered in ED Medications  sodium chloride flush (NS) 0.9 % injection 3 mL (has no administration in time range)  amLODipine (NORVASC) tablet 2.5 mg (has no administration in time range)     Initial Impression / Assessment and Plan / ED Course  I have reviewed the triage vital signs and the nursing notes.  Pertinent labs & imaging results that were available during my care of the patient were reviewed by me and considered in my medical decision making (see chart for details).    Patient with episode of palpitations and heart fluttering this evening that resolved after about 5 minutes.  No chest pain or shortness of breath.  Recent stroke with minimal right-sided residual deficits.  Unchanged today.  Patient hypertensive on arrival.  States that she took her Lopressor tonight but did not take amlodipine.  She stopped this on her own several weeks ago because she said her blood pressure was low in the 114 range. There is no mention of this in her recent discharge summary.  Nonfocal neurological exam.  Labs are reassuring.  Negative troponin.  Patient given her amlodipine with improvement in her blood pressure to the 941 systolic.  She denies  any chest pain, shortness of breath, dizziness, lightheadedness.  No nausea or vomiting.  Serial troponins negative. Patient feels back to baseline. No CP or SOB. No headache. No focal neuro deficits.  Advised to restart her prescribed amlodipine in addition to metoprolol.  Keep record of BP and followup with Dr. Terrence Dupont this week.  Return precautions discussed.   BP (!) 168/72   Pulse 61   Temp 98 F (36.7 C) (Oral)   Resp 17   Ht 5\' 6"  (1.676 m)   Wt 72.6 kg   SpO2 96%   BMI 25.82 kg/m    Final Clinical Impressions(s) / ED Diagnoses   Final diagnoses:  Palpitations    ED Discharge Orders    None       Yaritzel Stange, Annie Main, MD 02/20/18 1010

## 2018-02-20 NOTE — ED Notes (Signed)
Orthostatic BP assessed from sitting to standing.  Pt negative for marked orthostatic changes.

## 2018-02-20 NOTE — ED Notes (Signed)
Patient denies pain and is resting comfortably.   Pt states she never had pain with this episode, just the sensation of heart racing.

## 2018-02-20 NOTE — Discharge Instructions (Addendum)
Take your blood pressure medications as prescribed including amlodipine.  Follow-up with Dr. Terrence Dupont this week.  Return to the ED with chest pain, shortness of breath, new or worsening symptoms or any other concerns.

## 2018-02-22 DIAGNOSIS — M1990 Unspecified osteoarthritis, unspecified site: Secondary | ICD-10-CM | POA: Diagnosis not present

## 2018-02-22 DIAGNOSIS — I10 Essential (primary) hypertension: Secondary | ICD-10-CM | POA: Diagnosis not present

## 2018-02-22 DIAGNOSIS — Z8673 Personal history of transient ischemic attack (TIA), and cerebral infarction without residual deficits: Secondary | ICD-10-CM | POA: Diagnosis not present

## 2018-02-22 DIAGNOSIS — E785 Hyperlipidemia, unspecified: Secondary | ICD-10-CM | POA: Diagnosis not present

## 2018-03-22 ENCOUNTER — Ambulatory Visit: Payer: Medicare Other | Admitting: Neurology

## 2018-03-22 ENCOUNTER — Encounter: Payer: Self-pay | Admitting: Neurology

## 2018-03-22 VITALS — BP 144/83 | HR 64 | Ht 69.0 in | Wt 166.2 lb

## 2018-03-22 DIAGNOSIS — I639 Cerebral infarction, unspecified: Secondary | ICD-10-CM

## 2018-03-22 DIAGNOSIS — E7849 Other hyperlipidemia: Secondary | ICD-10-CM | POA: Diagnosis not present

## 2018-03-22 DIAGNOSIS — I6381 Other cerebral infarction due to occlusion or stenosis of small artery: Secondary | ICD-10-CM

## 2018-03-22 NOTE — Patient Instructions (Signed)
I had a long d/w patient about her recent lacunar stroke, asymptomatic brain aneurysms,risk for recurrent stroke/TIAs, personally independently reviewed imaging studies and stroke evaluation results and answered questions.Continue Plavix but discontinue aspirin as it has been greater than 3 weeks of dual antiplatelet therapy for secondary stroke prevention and maintain strict control of hypertension with blood pressure goal below 130/90, diabetes with hemoglobin A1c goal below 6.5% and lipids with LDL cholesterol goal below 70 mg/dL. I also advised the patient to eat a healthy diet with plenty of whole grains, cereals, fruits and vegetables, exercise regularly and maintain ideal body weight .continue conservative follow-up for asymptomatic brain aneurysms with yearly CT angios.  Followup in the future with me in 6 months or call earlier if necessary.  Stroke Prevention Some medical conditions and behaviors are associated with a higher chance of having a stroke. You can help prevent a stroke by making nutrition, lifestyle, and other changes, including managing any medical conditions you may have. What nutrition changes can be made?   Eat healthy foods. You can do this by: ? Choosing foods high in fiber, such as fresh fruits and vegetables and whole grains. ? Eating at least 5 or more servings of fruits and vegetables a day. Try to fill half of your plate at each meal with fruits and vegetables. ? Choosing lean protein foods, such as lean cuts of meat, poultry without skin, fish, tofu, beans, and nuts. ? Eating low-fat dairy products. ? Avoiding foods that are high in salt (sodium). This can help lower blood pressure. ? Avoiding foods that have saturated fat, trans fat, and cholesterol. This can help prevent high cholesterol. ? Avoiding processed and premade foods.  Follow your health care provider's specific guidelines for losing weight, controlling high blood pressure (hypertension), lowering high  cholesterol, and managing diabetes. These may include: ? Reducing your daily calorie intake. ? Limiting your daily sodium intake to 1,500 milligrams (mg). ? Using only healthy fats for cooking, such as olive oil, canola oil, or sunflower oil. ? Counting your daily carbohydrate intake. What lifestyle changes can be made?  Maintain a healthy weight. Talk to your health care provider about your ideal weight.  Get at least 30 minutes of moderate physical activity at least 5 days a week. Moderate activity includes brisk walking, biking, and swimming.  Do not use any products that contain nicotine or tobacco, such as cigarettes and e-cigarettes. If you need help quitting, ask your health care provider. It may also be helpful to avoid exposure to secondhand smoke.  Limit alcohol intake to no more than 1 drink a day for nonpregnant women and 2 drinks a day for men. One drink equals 12 oz of beer, 5 oz of wine, or 1 oz of hard liquor.  Stop any illegal drug use.  Avoid taking birth control pills. Talk to your health care provider about the risks of taking birth control pills if: ? You are over 65 years old. ? You smoke. ? You get migraines. ? You have ever had a blood clot. What other changes can be made?  Manage your cholesterol levels. ? Eating a healthy diet is important for preventing high cholesterol. If cholesterol cannot be managed through diet alone, you may also need to take medicines. ? Take any prescribed medicines to control your cholesterol as told by your health care provider.  Manage your diabetes. ? Eating a healthy diet and exercising regularly are important parts of managing your blood sugar. If your blood sugar  cannot be managed through diet and exercise, you may need to take medicines. ? Take any prescribed medicines to control your diabetes as told by your health care provider.  Control your hypertension. ? To reduce your risk of stroke, try to keep your blood pressure  below 130/80. ? Eating a healthy diet and exercising regularly are an important part of controlling your blood pressure. If your blood pressure cannot be managed through diet and exercise, you may need to take medicines. ? Take any prescribed medicines to control hypertension as told by your health care provider. ? Ask your health care provider if you should monitor your blood pressure at home. ? Have your blood pressure checked every year, even if your blood pressure is normal. Blood pressure increases with age and some medical conditions.  Get evaluated for sleep disorders (sleep apnea). Talk to your health care provider about getting a sleep evaluation if you snore a lot or have excessive sleepiness.  Take over-the-counter and prescription medicines only as told by your health care provider. Aspirin or blood thinners (antiplatelets or anticoagulants) may be recommended to reduce your risk of forming blood clots that can lead to stroke.  Make sure that any other medical conditions you have, such as atrial fibrillation or atherosclerosis, are managed. What are the warning signs of a stroke? The warning signs of a stroke can be easily remembered as BEFAST.  B is for balance. Signs include: ? Dizziness. ? Loss of balance or coordination. ? Sudden trouble walking.  E is for eyes. Signs include: ? A sudden change in vision. ? Trouble seeing.  F is for face. Signs include: ? Sudden weakness or numbness of the face. ? The face or eyelid drooping to one side.  A is for arms. Signs include: ? Sudden weakness or numbness of the arm, usually on one side of the body.  S is for speech. Signs include: ? Trouble speaking (aphasia). ? Trouble understanding.  T is for time. ? These symptoms may represent a serious problem that is an emergency. Do not wait to see if the symptoms will go away. Get medical help right away. Call your local emergency services (911 in the [place] Do not drive yourself  to the hospital.  Other signs of stroke may include: ? A sudden, severe headache with no known cause. ? Nausea or vomiting. ? Seizure. Where to find more information For more information, visit:  American Stroke Association: www.strokeassociation.org  National Stroke Association: www.stroke.org Summary  You can prevent a stroke by eating healthy, exercising, not smoking, limiting alcohol intake, and managing any medical conditions you may have.  Do not use any products that contain nicotine or tobacco, such as cigarettes and e-cigarettes. If you need help quitting, ask your health care provider. It may also be helpful to avoid exposure to secondhand smoke.  Remember BEFAST for warning signs of stroke. Get help right away if you or a loved one has any of these signs. This information is not intended to replace advice given to you by your health care provider. Make sure you discuss any questions you have with your health care provider. Document Released: 02/20/2004 Document Revised: 02/18/2016 Document Reviewed: 02/18/2016 Elsevier Interactive Patient Education  2019 [employer].

## 2018-03-22 NOTE — Progress Notes (Signed)
STROKE NEUROLOGY FOLLOW UP NOTE  NAME: [name] DOB: [birth year]  REASON FOR VISIT: stroke follow up HISTORY FROM: pt and chart  Today we had the pleasure of seeing [name] in follow-up at our [facility]. Pt was accompanied by no one.   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   She stated that she had stroke in 04/2005 but no residue and followed with Dr. Leonie Man in clinic, last seen 03/2015. For the last 6 months to a year, she started to have right sided neck pain, stiffness, with right shoulder and lateral arm pain with mildly limited ROM, with radiating pain to the forearm and right palm. Comes and goes, sometimes can be intense, but other time dull achy. It can happen every the other day. She also has LBP comes and goes, sometimes hard to get out of the car due to LBP.  However, last Monday, she had right side facial numbness, at the left cheek area, feels like norvacaine shot at dental office. The numbness lasted about one day and resolved. She denies any HA, weakness, speech or swallow difficulty, no arm/leg issues at that time. She is compliant with aggrenox and lipitor.  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 Patient has previously met with Dr. Estanislado Pandy three times to discuss endovascular treatment and chosen conservative monitoring and follow up.  She had HTN and stable taking meds, today BP 129/66. She denies DM and she check her glucose at home was 115-130.    Update 11/21/2015 : She returns for follow-up after last visit with Dr. showed 2 months  ago. She states she's not had any further episodes of numbness on cheek. She feels muscle tightness in the back of her head and neck but denies significant radicular pain. She did have an episode of her right hand fingers locking up after and morning when she was taking taken pending her wrist a lot in the kitchen. She did not undergo MRI scan the brain and cervical spine has been never got a call to schedule that but have now been scheduled for 12/02/15. Similarly CT angiogram of the brain for follow-up of aneurysms is also scheduled for the same day. Patient was seen by gastroenterologist Dr. Collene Mares today for rectal bleeding and is scheduled to undergo colonoscopy in December. She is concerned about risk of stroke and stopping Aggrenox for 5 days prior to the procedure.  Update 05/05/2016 : She returns for follow-up after last visit 6 months ago. She continues to do well without recurrent stroke or TIA symptoms. She had follow-up CT angiogram of the brain and neck done on 12/02/15 which showed stable appearance of her intracranial aneurysms. Patient states his started walking regularly and has lost 20 pounds. She occasionally hears her heartbeat in the ears particularly at night when she is lying down quietly. She denies significant stress. She is also noticed some intermittent numbness in the lower lateral aspect of the left foot. She does agree that she sleeps on her left side a lot. She denies significant back pain or radicular  pain. She has not had any recurrent stroke or TIA symptoms. She states her blood pressure is well controlled today it is 120/63. Update 11/29/2017 : She returns for follow-up after last visit a year ago.  She continues to do well without definite stroke or TIA symptoms now for greater than 10 years.  She was however seen in the emergency room on 06/29/2017 and she woke up with tingling in the left hand as well as left foot.  She was seen by neurologist Dr. Cheral Marker who felt her symptoms are  not comparable with stroke.  She did have a prior history of carpal tunnel and hand numbness seems similar to this.  I had previously seen her for left foot numbness which I thought was related to peroneal entrapment neuropathy.  Patient had a CT scan of the head done which showed no acute abnormality.  Patient states the symptoms have not recurred since then.  She remains on Aggrenox which is tolerating well without side effects.  Her blood pressure fluctuates a bit at times it is in the low 110 range but today it is 140/78.  She takes metoprolol 25 mg daily.  She states her lipid profile is well controlled and she is on Lipitor 20 and Zetia.  She does follow-up regularly with a primary physician.  She had CT angiogram of the brain done on 12/28/2016 which showed stable appearance of her intracranial aneurysms.  She is due for a follow-up CTA next month. Update 03/22/2018 : She returns for follow-up after last visit 3 months ago.  She was hospitalized for a small thalamic lacunar infarct in December 2019.  She complained of some numbness and incoordination of her hand as well as felt jittery and tremulous.  The symptoms are similar to her previous stroke in 2007 and she went to the hospital.  MRI scan of the brain was obtained which showed a tiny acute lacunar infarct in the left thalamus.  Carotid ultrasound was unremarkable.  She had recently had CT angiogram of the brain in November 2019 for follow-up of her asymptomatic left cavernous and pericallosal aneurysm since it was not repeated.  Echocardiogram showed normal ejection fraction.  LDL cholesterol was 95 mg percent.  Hemoglobin A1c was 5.7.  Patient had been on Aggrenox following her previous stroke since 2007.  This was changed to aspirin and Plavix.  She was advised to stop aspirin after 3 weeks was Ms. misunderstood this and is currently on both.  She is tolerating them well without bruising or bleeding.  She states her numbness and clumsiness has  resolved completely.  She has no focal deficits.  She is on Lipitor now 40 mg daily.  She has not had any follow-up lipid profile checked.  She has been keeping track of her blood pressure and is worried that her morning systolic ranges in the 119J range.  She has an upcoming visit with her primary care physician Dr. Concha Pyo and plans to discuss this.  She is also noticed some lumps beneath her right jaw which are probably enlarged lymph nodes.  She denies any significant tooth pain, recurrent sinus infections. REVIEW OF SYSTEMS: Full 14 system review of systems performed and notable only for those listed below and in HPI above, all others are negative:     Palpitations, cataracts, joint pain, muscle cramps, insomnia, not enough sleep all othersystems negative The following represents the patient's updated allergies and side effects list: Allergies  Allergen Reactions  . Flagyl [Metronidazole] Itching and  Swelling    The neurologically relevant items on the patient's problem list were reviewed on today's visit.  Neurologic Examination  A problem focused neurological exam (12 or more points of the single system neurologic examination, vital signs counts as 1 point, cranial nerves count for 8 points) was performed.  Blood pressure (!) 144/83, pulse 64, height '5\' 9"'$  (1.753 m), weight 166 lb 3.2 oz (75.4 kg).  General - Well nourished, well developed elderly African-American lady, in no apparent distress.  Ophthalmologic - funduscopic exam not done   Cardiovascular - Regular rate and rhythm with no murmur.  Mental Status -  Level of arousal and orientation to time, place, and person were intact. Language including expression, naming, repetition, comprehension was assessed and found intact. Fund of Knowledge was assessed and was intact.  Cranial Nerves II - XII - II - Visual field intact OU. III, IV, VI - Extraocular movements intact. V - Facial sensation intact bilaterally. VII - Facial  movement intact bilaterally. VIII - Hearing & vestibular intact bilaterally. X - Palate elevates symmetrically. XI - Chin turning & shoulder shrug intact bilaterally. XII - Tongue protrusion intact.  Motor Strength - The patient's strength was normal in all extremities and pronator drift was absent.  Bulk was normal and fasciculations were absent.   Motor Tone - Muscle tone was assessed at the neck and appendages and was normal.  Reflexes - The patient's reflexes were 1+ in all extremities and she had no pathological reflexes.  Sensory - Light touch, temperature/pinprick were assessed and were normal.    Coordination - The patient had normal movements in the hands and feet with no ataxia or dysmetria.  Tremor was absent.  Gait and Station - The patient's transfers, posture, gait, station, and turns were observed as normal.  Mild difficulty in tandem walking  Data reviewed: I personally reviewed the images and agree with the radiology interpretations.  CT angio brain and neck 12/02/2015 :  1. 8 mm left cavernous ICA aneurysm is unchanged compared to 08/18/2014. 2. 2-3 mm right ophthalmic ICA aneurysm is unchanged from prior. 3. 1-2 mm outpouching from the supraclinoid left ICA is stable and favors infundibulum. CT angio head and neck 12/28/2016 :  1. Unchanged 8 mm left cavernous and 3 mm right paraophthalmic ICA Aneurysms. 2. Unchanged 1-2 mm left supraclinoid ICA outpouching suggesting an infundibulum. Ct Head 06/29/2017 : Mild atrophy and small vessel disease. Old LEFT centrum semiovale lacunar infarct. No acute findings. Assessment:   she is a 73 y.o. 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  Transient episode of left hand and feet paresthesias of unclear etiology in June 2019.   History of mild carpal tunnel syndrome and peroneal neuropathy which appears stable.  Recent right thalamic lacunar infarct in December 2019 from which she has made a full recovery. Plan:  I had a long d/w patient about her recent lacunar stroke, asymptomatic brain aneurysms,risk for recurrent stroke/TIAs, personally independently reviewed imaging studies and stroke evaluation results and answered questions.Continue Plavix but discontinue aspirin as it has been greater than 3 weeks of dual antiplatelet therapy for secondary stroke prevention and maintain strict control of hypertension with blood pressure goal below 130/90, diabetes with hemoglobin A1c goal below 6.5% and lipids with LDL cholesterol goal below 70 mg/dL. I also advised the  patient to eat a healthy diet with plenty of whole grains, cereals, fruits and vegetables, exercise regularly and maintain ideal body weight .continue conservative follow-up for asymptomatic brain aneurysms with yearly CT angios.  Followup in the future with me in 6 months or call earlier if necessary.she was advised to follow-up with her primary care physician Dr Shelia Media to discuss her enlarged submandibular lymph nodes and for closer monitoring of her borderline high blood pressure.  Greater than 50% time during this 25 minute visit was spent on counseling and coordination of care about her thalamic lacunar infarct, asymptomatic cerebral aneurysms, stroke and TIA risk, left foot peroneal neuropathy and answering questions  . Antony Contras, MD  [facility] [address] [place], [place] [address]  Phone [phone] Fax [phone]  Antony Contras, MD [facility] [address] [place], [place] [address] [phone]

## 2018-03-23 LAB — LIPID PANEL
CHOLESTEROL TOTAL: 130 mg/dL (ref 100–199)
Chol/HDL Ratio: 2.2 ratio (ref 0.0–4.4)
HDL: 58 mg/dL (ref >39)
LDL Calculated: 60 mg/dL (ref 0–99)
Triglycerides: 60 mg/dL (ref 0–149)
VLDL Cholesterol Cal: 12 mg/dL (ref 5–40)

## 2018-03-24 ENCOUNTER — Telehealth: Payer: Self-pay | Admitting: *Deleted

## 2018-03-24 NOTE — Telephone Encounter (Signed)
LMOM (identified vm) that cholesterol profile is ok; continue current tx., no new recommendations at this time. She does not need to return this call unless she has questions/fim

## 2018-03-24 NOTE — Telephone Encounter (Signed)
-----   Message from Garvin Fila, MD sent at 03/23/2018  4:43 PM EST ----- Kindly inform the patient that cholesterol profile was satisfactory

## 2018-03-25 NOTE — Telephone Encounter (Signed)
Received a call from pt. requesting cholesterol values. I gave values for total cholesterol, HDL and LDL  Pt. verbalized understanding of same/fim

## 2018-04-07 DIAGNOSIS — D4989 Neoplasm of unspecified behavior of other specified sites: Secondary | ICD-10-CM | POA: Diagnosis not present

## 2018-04-19 ENCOUNTER — Other Ambulatory Visit: Payer: Self-pay | Admitting: Neurology

## 2018-04-26 DIAGNOSIS — H2513 Age-related nuclear cataract, bilateral: Secondary | ICD-10-CM | POA: Diagnosis not present

## 2018-04-26 DIAGNOSIS — H04123 Dry eye syndrome of bilateral lacrimal glands: Secondary | ICD-10-CM | POA: Diagnosis not present

## 2018-04-26 DIAGNOSIS — H401122 Primary open-angle glaucoma, left eye, moderate stage: Secondary | ICD-10-CM | POA: Diagnosis not present

## 2018-04-26 DIAGNOSIS — H401111 Primary open-angle glaucoma, right eye, mild stage: Secondary | ICD-10-CM | POA: Diagnosis not present

## 2018-05-30 DIAGNOSIS — I10 Essential (primary) hypertension: Secondary | ICD-10-CM | POA: Diagnosis not present

## 2018-06-02 DIAGNOSIS — I10 Essential (primary) hypertension: Secondary | ICD-10-CM | POA: Diagnosis not present

## 2018-06-02 DIAGNOSIS — E7800 Pure hypercholesterolemia, unspecified: Secondary | ICD-10-CM | POA: Diagnosis not present

## 2018-06-02 DIAGNOSIS — Z8673 Personal history of transient ischemic attack (TIA), and cerebral infarction without residual deficits: Secondary | ICD-10-CM | POA: Diagnosis not present

## 2018-06-02 DIAGNOSIS — Z0000 Encounter for general adult medical examination without abnormal findings: Secondary | ICD-10-CM | POA: Diagnosis not present

## 2018-06-02 DIAGNOSIS — I671 Cerebral aneurysm, nonruptured: Secondary | ICD-10-CM | POA: Diagnosis not present

## 2018-07-07 DIAGNOSIS — I10 Essential (primary) hypertension: Secondary | ICD-10-CM | POA: Diagnosis not present

## 2018-07-07 DIAGNOSIS — R221 Localized swelling, mass and lump, neck: Secondary | ICD-10-CM | POA: Diagnosis not present

## 2018-07-07 DIAGNOSIS — J029 Acute pharyngitis, unspecified: Secondary | ICD-10-CM | POA: Diagnosis not present

## 2018-07-07 DIAGNOSIS — Z8673 Personal history of transient ischemic attack (TIA), and cerebral infarction without residual deficits: Secondary | ICD-10-CM | POA: Diagnosis not present

## 2018-07-25 DIAGNOSIS — K219 Gastro-esophageal reflux disease without esophagitis: Secondary | ICD-10-CM | POA: Diagnosis not present

## 2018-07-25 DIAGNOSIS — R0989 Other specified symptoms and signs involving the circulatory and respiratory systems: Secondary | ICD-10-CM | POA: Insufficient documentation

## 2018-07-25 DIAGNOSIS — R220 Localized swelling, mass and lump, head: Secondary | ICD-10-CM | POA: Insufficient documentation

## 2018-07-25 DIAGNOSIS — R221 Localized swelling, mass and lump, neck: Secondary | ICD-10-CM | POA: Insufficient documentation

## 2018-08-17 DIAGNOSIS — E785 Hyperlipidemia, unspecified: Secondary | ICD-10-CM | POA: Diagnosis not present

## 2018-08-17 DIAGNOSIS — M1990 Unspecified osteoarthritis, unspecified site: Secondary | ICD-10-CM | POA: Diagnosis not present

## 2018-08-17 DIAGNOSIS — I10 Essential (primary) hypertension: Secondary | ICD-10-CM | POA: Diagnosis not present

## 2018-08-17 DIAGNOSIS — Z8673 Personal history of transient ischemic attack (TIA), and cerebral infarction without residual deficits: Secondary | ICD-10-CM | POA: Diagnosis not present

## 2018-08-21 ENCOUNTER — Other Ambulatory Visit: Payer: Self-pay

## 2018-08-21 ENCOUNTER — Emergency Department (HOSPITAL_COMMUNITY)
Admission: EM | Admit: 2018-08-21 | Discharge: 2018-08-21 | Disposition: A | Discharge status: Home / Self Care | Payer: Medicare Other | Attending: Emergency Medicine | Admitting: Emergency Medicine

## 2018-08-21 ENCOUNTER — Encounter (HOSPITAL_COMMUNITY): Payer: Self-pay

## 2018-08-21 DIAGNOSIS — G252 Other specified forms of tremor: Secondary | ICD-10-CM | POA: Diagnosis not present

## 2018-08-21 DIAGNOSIS — Z79899 Other long term (current) drug therapy: Secondary | ICD-10-CM | POA: Diagnosis not present

## 2018-08-21 DIAGNOSIS — R251 Tremor, unspecified: Secondary | ICD-10-CM | POA: Insufficient documentation

## 2018-08-21 DIAGNOSIS — I10 Essential (primary) hypertension: Secondary | ICD-10-CM | POA: Diagnosis not present

## 2018-08-21 DIAGNOSIS — Z8673 Personal history of transient ischemic attack (TIA), and cerebral infarction without residual deficits: Secondary | ICD-10-CM | POA: Diagnosis not present

## 2018-08-21 DIAGNOSIS — Z7901 Long term (current) use of anticoagulants: Secondary | ICD-10-CM | POA: Insufficient documentation

## 2018-08-21 LAB — DIFFERENTIAL
Abs Immature Granulocytes: 0.01 10*3/uL (ref 0.00–0.07)
Basophils Absolute: 0 10*3/uL (ref 0.0–0.1)
Basophils Relative: 0 %
Eosinophils Absolute: 0 10*3/uL (ref 0.0–0.5)
Eosinophils Relative: 1 %
Immature Granulocytes: 0 %
Lymphocytes Relative: 21 %
Lymphs Abs: 0.9 10*3/uL (ref 0.7–4.0)
Monocytes Absolute: 0.4 10*3/uL (ref 0.1–1.0)
Monocytes Relative: 9 %
Neutro Abs: 3.1 10*3/uL (ref 1.7–7.7)
Neutrophils Relative %: 69 %

## 2018-08-21 LAB — COMPREHENSIVE METABOLIC PANEL
ALT: 23 U/L (ref 0–44)
AST: 29 U/L (ref 15–41)
Albumin: 3.8 g/dL (ref 3.5–5.0)
Alkaline Phosphatase: 70 U/L (ref 38–126)
Anion gap: 7 (ref 5–15)
BUN: 10 mg/dL (ref 8–23)
CO2: 25 mmol/L (ref 22–32)
Calcium: 9.7 mg/dL (ref 8.9–10.3)
Chloride: 108 mmol/L (ref 98–111)
Creatinine, Ser: 0.73 mg/dL (ref 0.44–1.00)
GFR calc Af Amer: >60 mL/min (ref >60)
GFR calc non Af Amer: >60 mL/min (ref >60)
Glucose, Bld: 131 mg/dL — ABNORMAL HIGH (ref 70–99)
Potassium: 3.9 mmol/L (ref 3.5–5.1)
Sodium: 140 mmol/L (ref 135–145)
Total Bilirubin: 0.6 mg/dL (ref 0.3–1.2)
Total Protein: 7 g/dL (ref 6.5–8.1)

## 2018-08-21 LAB — I-STAT CHEM 8, ED
BUN: 11 mg/dL (ref 8–23)
Calcium, Ion: 1.34 mmol/L (ref 1.15–1.40)
Chloride: 107 mmol/L (ref 98–111)
Creatinine, Ser: 0.7 mg/dL (ref 0.44–1.00)
Glucose, Bld: 128 mg/dL — ABNORMAL HIGH (ref 70–99)
HCT: 38 % (ref 36.0–46.0)
Hemoglobin: 12.9 g/dL (ref 12.0–15.0)
Potassium: 4 mmol/L (ref 3.5–5.1)
Sodium: 141 mmol/L (ref 135–145)
TCO2: 27 mmol/L (ref 22–32)

## 2018-08-21 LAB — URINALYSIS, ROUTINE W REFLEX MICROSCOPIC
Bilirubin Urine: NEGATIVE
Glucose, UA: NEGATIVE mg/dL
Hgb urine dipstick: NEGATIVE
Ketones, ur: NEGATIVE mg/dL
Leukocytes,Ua: NEGATIVE
Nitrite: NEGATIVE
Protein, ur: NEGATIVE mg/dL
Specific Gravity, Urine: 1.003 — ABNORMAL LOW (ref 1.005–1.030)
pH: 7 (ref 5.0–8.0)

## 2018-08-21 LAB — RAPID URINE DRUG SCREEN, HOSP PERFORMED
Amphetamines: NOT DETECTED
Barbiturates: NOT DETECTED
Benzodiazepines: NOT DETECTED
Cocaine: NOT DETECTED
Opiates: NOT DETECTED
Tetrahydrocannabinol: NOT DETECTED

## 2018-08-21 LAB — CBC
HCT: 37.2 % (ref 36.0–46.0)
Hemoglobin: 12.3 g/dL (ref 12.0–15.0)
MCH: 30.8 pg (ref 26.0–34.0)
MCHC: 33.1 g/dL (ref 30.0–36.0)
MCV: 93 fL (ref 80.0–100.0)
Platelets: 246 10*3/uL (ref 150–400)
RBC: 4 MIL/uL (ref 3.87–5.11)
RDW: 13.5 % (ref 11.5–15.5)
WBC: 4.5 10*3/uL (ref 4.0–10.5)
nRBC: 0 % (ref 0.0–0.2)

## 2018-08-21 LAB — PROTIME-INR
INR: 1.2 (ref 0.8–1.2)
Prothrombin Time: 15.1 seconds (ref 11.4–15.2)

## 2018-08-21 LAB — APTT: aPTT: 30 seconds (ref 24–36)

## 2018-08-21 LAB — ETHANOL: Alcohol, Ethyl (B): <10 mg/dL (ref <10)

## 2018-08-21 NOTE — ED Notes (Signed)
Sister Rodolph Bong [phone]

## 2018-08-21 NOTE — ED Provider Notes (Signed)
[facility] EMERGENCY DEPARTMENT Provider Note   CSN: [id number] Arrival date & time: 08/21/18  1119     History   Chief Complaint Chief Complaint  Patient presents with  . Hypertension  . Weakness    HPI [name] is a 73 y.o. female who presents emergency department with chief complaint of high blood pressure and jitteriness.  She is a previous history of hypertension, left basal ganglia infarct in 2007.  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  Patient states that when she had her previous CVA she noticed that she was very jittery and that she was trying to write her hand was "moving like an EKG across the paper."  The patient states that this morning she was doing fine.  She normally checks her blood pressure twice daily.  She sees Dr. Terrence Dupont as her PCP and saw him last week.  She was complaining of peripheral edema and he cut her amlodipine from 5 mg in the morning down to 2.5.  This morning the patient noticed that should her blood pressure was elevated.  She usually runs about 120 or 130/80.  This morning her blood pressure was about 170 and then she took it again later is 195.  She noticed that she was also very jittery and became concerned that she could potentially be having another cerebrovascular accident.  Patient went ahead and took her previous full dose of amlodipine.  She took the rest of her regular medications.  She called EMS to present to the emergency department.  She denies headache, chest pain, changes in vision, dysgraphia, unilateral weakness.  She said that her legs felt weak earlier bilaterally.  She denies any ataxia, vertigo, difficulty with speech or swallowing.     HPI  Past Medical History:  Diagnosis Date  . Anemia   . Cystic breast   . Cystic breast   . History of chicken pox   . History of measles   . History of  mumps   . Hypertension   . Stroke [facility])    TIA  . Trichomonas   . Trichomonas   . Yeast infection     Patient Active Problem List   Diagnosis Date Noted  . Essential hypertension 01/25/2018  . HLD (hyperlipidemia) 01/25/2018  . Anxiety 01/25/2018  . CVA (cerebral vascular accident) ([place]) 01/24/2018  . Disorder of shoulder 03/18/2017  . Pain in joint of right shoulder 03/18/2017  . TIA (transient ischemic attack) 09/20/2015  . DDD (degenerative disc disease), cervical 09/20/2015  . History of stroke 09/20/2015  . Aneurysm, cerebral, nonruptured 03/28/2015  . Bilateral leg pain 10/26/2011  . Edema of lower extremity 10/26/2011  . Pelvic pain in female 08/25/2011  . Yeast infection   . Trichomonas   . Cystic breast   . Stroke ([place])   . History of chicken pox   . History of measles   . History of mumps     Past Surgical History:  Procedure Laterality Date  . DILATION AND CURETTAGE OF UTERUS    . excision of fibroadenoma       OB History    Gravida  2   Para  2   Term  2   Preterm      AB      Living  2     SAB      TAB      Ectopic  Multiple      Live Births  2            Home Medications    Prior to Admission medications   Medication Sig Start Date End Date Taking? Authorizing Provider  acetaminophen (TYLENOL) 500 MG tablet Take 500 mg by mouth every 6 (six) hours as needed for mild pain or moderate pain.     [provider]  ALPRAZolam Duanne Moron) 1 MG tablet Take 1 mg by mouth 2 (two) times daily as needed for anxiety.  11/19/15   [provider]  amLODipine (NORVASC) 2.5 MG tablet Take 2.5 mg by mouth daily. 01/27/18   [provider]  atorvastatin (LIPITOR) 40 MG tablet Take 1 tablet (40 mg total) by mouth daily at 6 PM. 01/25/18   Sheikh, Georgina Quint Latif, DO  brimonidine (ALPHAGAN) 0.15 % ophthalmic solution Place 1 drop into both eyes 2 (two) times daily.     [provider]  clopidogrel (PLAVIX) 75 MG  tablet TAKE 1 TABLET BY MOUTH  DAILY 04/19/18   Garvin Fila, MD  ezetimibe (ZETIA) 10 MG tablet Take 10 mg by mouth at bedtime.  11/23/17   [provider]  latanoprost (XALATAN) 0.005 % ophthalmic solution Place 1 drop into both eyes at bedtime. 02/25/14   [provider]  Metoprolol Succinate 25 MG CS24 Take 25 mg by mouth daily.     [provider]  senna-docusate (SENOKOT-S) 8.6-50 MG tablet Take 1 tablet by mouth at bedtime as needed for mild constipation. 01/25/18   Kerney Elbe, DO    Family History Family History  Problem Relation Age of Onset  . Cancer Mother   . Cancer Father   . Deep vein thrombosis Sister   . Diabetes Sister   . Hyperlipidemia Sister   . Diabetes Daughter   . Hyperlipidemia Daughter     Social History Social History   Tobacco Use  . Smoking status: Never Smoker  . Smokeless tobacco: Never Used  Substance Use Topics  . Alcohol use: No    Alcohol/week: 0.0 standard drinks  . Drug use: No     Allergies   Flagyl [metronidazole]   Review of Systems Review of Systems  Ten systems reviewed and are negative for acute change, except as noted in the HPI.   Physical Exam Updated Vital Signs BP (!) 178/79 (BP Location: Right Arm)   Pulse 70   Temp 98.5 F (36.9 C) (Oral)   Ht 5\' 6"  (1.676 m)   Wt 73.5 kg   SpO2 99%   BMI 26.15 kg/m   Physical Exam Vitals signs and nursing note reviewed.  Constitutional:      General: She is not in acute distress.    Appearance: She is well-developed. She is not diaphoretic.  HENT:     Head: Normocephalic and atraumatic.  Eyes:     General: No scleral icterus.    Conjunctiva/sclera: Conjunctivae normal.  Neck:     Musculoskeletal: Normal range of motion.  Cardiovascular:     Rate and Rhythm: Normal rate and regular rhythm.     Heart sounds: Normal heart sounds. No murmur. No friction rub. No gallop.   Pulmonary:     Effort: Pulmonary effort is normal. No  respiratory distress.     Breath sounds: Normal breath sounds.  Abdominal:     General: Bowel sounds are normal. There is no distension.     Palpations: Abdomen is soft. There is no mass.  Tenderness: There is no abdominal tenderness. There is no guarding.  Skin:    General: Skin is warm and dry.  Neurological:     Mental Status: She is alert and oriented to person, place, and time.     Comments: Speech is clear and goal oriented, follows commands Major Cranial nerves without deficit, no facial droop Normal strength in upper and lower extremities bilaterally including dorsiflexion and plantar flexion, strong and equal grip strength Sensation normal to light and sharp touch Moves extremities without ataxia, coordination intact Normal finger to nose and rapid alternating movements Neg romberg, no pronator drift Normal gait Normal heel-shin and balance   Psychiatric:        Behavior: Behavior normal.      ED Treatments / Results  Labs (all labs ordered are listed, but only abnormal results are displayed) Labs Reviewed  ETHANOL  PROTIME-INR  APTT  CBC  DIFFERENTIAL  COMPREHENSIVE METABOLIC PANEL  RAPID URINE DRUG SCREEN, HOSP PERFORMED  URINALYSIS, ROUTINE W REFLEX MICROSCOPIC  I-STAT CHEM 8, ED    EKG EKG Interpretation  Date/Time:  Sunday August 21 2018 11:49:46 EDT Ventricular Rate:  67 PR Interval:    QRS Duration: 96 QT Interval:  377 QTC Calculation: 398 R Axis:   38 Text Interpretation:  Sinus rhythm Probable left atrial enlargement RSR' in V1 or V2, right VCD or RVH No significant change since last tracing Confirmed by Quintella Reichert [phone]) on 08/21/2018 12:24:54 PM   Radiology No results found.  Procedures Procedures (including critical care time)  Medications Ordered in ED Medications - No data to display   Initial Impression / Assessment and Plan / ED Course  I have reviewed the triage vital signs and the nursing notes.  Pertinent labs &  imaging results that were available during my care of the patient were reviewed by me and considered in my medical decision making (see chart for details).        CC:htn, tremor VS: BP (!) 161/73 (BP Location: Right Arm)   Pulse (!) 56   Temp 98.5 F (36.9 C) (Oral)   Resp 17   Ht 5\' 6"  (1.676 m)   Wt 73.5 kg   SpO2 100%   BMI 26.15 kg/m   GM:WNUUVOZ is gathered by patient  and EMR. Labs: I reviewed the labs which show negative UDS, UA without signs of infection, negative ethanol, PT and INR within normal limits, APTT also within normal limits.  CBC shows no acute abnormalities.  CMP shows mildly elevated blood glucose without other abnormality. Imaging: Not applicable EKG:.  No   EKG Interpretation  Date/Time:  Sunday August 21 2018 11:49:46 EDT Ventricular Rate:  67 PR Interval:    QRS Duration: 96 QT Interval:  377 QTC Calculation: 398 R Axis:   38 Text Interpretation:  Sinus rhythm Probable left atrial enlargement RSR' in V1 or V2, right VCD or RVH No significant change since last tracing Confirmed by Quintella Reichert [phone]) on 08/21/2018 12:24:54 PM Also confirmed by Quintella Reichert [phone]), editor Hattie Perch ([id number])  on 08/22/2018 6:59:02 AM       MDM: Patient here with jitteriness.  She has no current symptoms and no objective findings on her neurologic examination.  I suspect that she is very likely anxious.  She is very well-appearing.  She was seen and shared visit with Dr. Ralene Bathe who agrees that we do not need to proceed with any neurologic imaging at this time.  Patient appears appropriate for discharge with  close outpatient follow-up issues Patient disposition: .  Discharge.  Patient condition: Good. The patient appears reasonably screened and/or stabilized for discharge and I doubt any other medical condition or other [facility] requiring further screening, evaluation, or treatment in the ED at this time prior to discharge. I have discussed lab and/or imaging findings  with the patient and answered all questions/concerns to the best of my ability. I have discussed return precautions and OP follow up.      Final Clinical Impressions(s) / ED Diagnoses   Final diagnoses:  None    ED Discharge Orders    None       Margarita Mail, PA-C 08/27/18 1649    Quintella Reichert, MD 08/30/18 [phone]

## 2018-08-21 NOTE — ED Triage Notes (Signed)
Patient states at 930 this morning she had high blood pressure, jitteryness and leg weakness, muscle pain in  Left shoulder and neck.  jitteryness and neck pain has subsided.  Anxious about how high her blood pressure, her Norvasc was decreased  On last Wednesday, but took full 5mg  today after checking her pressure

## 2018-08-21 NOTE — ED Notes (Signed)
AVS provided, discharge instruction discussed  Follow up appts reviewed.  No further questions.

## 2018-08-21 NOTE — Discharge Instructions (Addendum)
Please take your 5mg  dose of amlodipine daily until you follow up with Dr. Terrence Dupont. Return for any new or concerning symptoms  Contact a health care provider if you: Think you are having a reaction to a medicine you are taking. Have headaches that keep coming back (recurring). Feel dizzy. Have trouble with your vision. Get help right away if you: Develop a severe headache or confusion. Have unusual weakness or numbness. Feel faint. Have severe pain in your chest or abdomen. Vomit repeatedly. Have trouble breathing.

## 2018-08-22 DIAGNOSIS — Z8673 Personal history of transient ischemic attack (TIA), and cerebral infarction without residual deficits: Secondary | ICD-10-CM | POA: Diagnosis not present

## 2018-08-22 DIAGNOSIS — M1990 Unspecified osteoarthritis, unspecified site: Secondary | ICD-10-CM | POA: Diagnosis not present

## 2018-08-22 DIAGNOSIS — I10 Essential (primary) hypertension: Secondary | ICD-10-CM | POA: Diagnosis not present

## 2018-08-22 DIAGNOSIS — E785 Hyperlipidemia, unspecified: Secondary | ICD-10-CM | POA: Diagnosis not present

## 2018-08-23 ENCOUNTER — Other Ambulatory Visit: Payer: Self-pay

## 2018-08-23 ENCOUNTER — Telehealth: Payer: Self-pay | Admitting: Neurology

## 2018-08-23 MED ORDER — CLOPIDOGREL BISULFATE 75 MG PO TABS: 75.0000 mg | ORAL_TABLET | Freq: Every day | ORAL | 0 refills | Status: DC

## 2018-08-23 NOTE — Telephone Encounter (Signed)
I sent [facility] until [facility] order comes in for pt.

## 2018-08-23 NOTE — Telephone Encounter (Signed)
I called the pharmacy and spoke with staff.They stated its too early for pt refill plavix for 14 pills. The pts insurance has already been billed for the mail order that's been delayed.He stated pt can pay 11.00 dollars for 14 pills on a good rx card. He will call pt to notify her of this.

## 2018-08-23 NOTE — Telephone Encounter (Signed)
Pt called in and stated [facility] is stating its to early for fill of the 14 pills, she wants to know if pharmacy can be contacted

## 2018-08-23 NOTE — Telephone Encounter (Signed)
Pt wants to know if 5-10 clopidogrel (PLAVIX) 75 MG tablet can be sent to the local pharmacy due to meds currently being stuck in [place] through her mail order pharmacy.  [id number] [address] [place] [address] [place]

## 2018-09-13 ENCOUNTER — Encounter: Payer: Self-pay | Admitting: Sports Medicine

## 2018-09-13 ENCOUNTER — Ambulatory Visit (INDEPENDENT_AMBULATORY_CARE_PROVIDER_SITE_OTHER): Payer: Medicare Other | Admitting: Sports Medicine

## 2018-09-13 ENCOUNTER — Ambulatory Visit: Payer: Medicare Other

## 2018-09-13 ENCOUNTER — Other Ambulatory Visit: Payer: Self-pay

## 2018-09-13 VITALS — Temp 98.2°F

## 2018-09-13 DIAGNOSIS — M79674 Pain in right toe(s): Secondary | ICD-10-CM

## 2018-09-13 DIAGNOSIS — M79675 Pain in left toe(s): Secondary | ICD-10-CM | POA: Diagnosis not present

## 2018-09-13 DIAGNOSIS — B351 Tinea unguium: Secondary | ICD-10-CM

## 2018-09-13 DIAGNOSIS — H401122 Primary open-angle glaucoma, left eye, moderate stage: Secondary | ICD-10-CM | POA: Diagnosis not present

## 2018-09-13 DIAGNOSIS — H401111 Primary open-angle glaucoma, right eye, mild stage: Secondary | ICD-10-CM | POA: Diagnosis not present

## 2018-09-13 DIAGNOSIS — N952 Postmenopausal atrophic vaginitis: Secondary | ICD-10-CM | POA: Insufficient documentation

## 2018-09-13 DIAGNOSIS — M79671 Pain in right foot: Secondary | ICD-10-CM

## 2018-09-13 DIAGNOSIS — D259 Leiomyoma of uterus, unspecified: Secondary | ICD-10-CM | POA: Insufficient documentation

## 2018-09-13 DIAGNOSIS — Z7901 Long term (current) use of anticoagulants: Secondary | ICD-10-CM

## 2018-09-13 DIAGNOSIS — H2513 Age-related nuclear cataract, bilateral: Secondary | ICD-10-CM | POA: Diagnosis not present

## 2018-09-13 DIAGNOSIS — H5201 Hypermetropia, right eye: Secondary | ICD-10-CM | POA: Diagnosis not present

## 2018-09-13 NOTE — Progress Notes (Signed)
Subjective: [name] is a 73 y.o. female patient with history of diabetes who presents to office today complaining of long,mildly painful toenails especially the right first toe greater than the left first toe with significant thickness of the toenails reports that it is difficult for her to trim herself.  Patient denies any new changes in medication or new problems.   Patient Active Problem List   Diagnosis Date Noted  . Atrophic vaginitis 09/13/2018  . Uterine leiomyoma 09/13/2018  . Gastroesophageal reflux disease without esophagitis 07/25/2018  . Globus pharyngeus 07/25/2018  . Submandibular swelling 07/25/2018  . Essential hypertension 01/25/2018  . HLD (hyperlipidemia) 01/25/2018  . Anxiety 01/25/2018  . CVA (cerebral vascular accident) ([place]) 01/24/2018  . Disorder of shoulder 03/18/2017  . Pain in joint of right shoulder 03/18/2017  . TIA (transient ischemic attack) 09/20/2015  . DDD (degenerative disc disease), cervical 09/20/2015  . History of stroke 09/20/2015  . Aneurysm, cerebral, nonruptured 03/28/2015  . Bilateral leg pain 10/26/2011  . Edema of lower extremity 10/26/2011  . Pelvic pain in female 08/25/2011  . Yeast infection   . Trichomonas   . Cystic breast   . Stroke ([place])   . History of chicken pox   . History of measles   . History of mumps    Current Outpatient Medications on File Prior to Visit  Medication Sig Dispense Refill  . acetaminophen (TYLENOL) 500 MG tablet Take 500 mg by mouth every 6 (six) hours as needed for mild pain or moderate pain.     Marland Kitchen ALPRAZolam (XANAX) 1 MG tablet Take 1 mg by mouth 2 (two) times daily as needed for anxiety.     Marland Kitchen amLODipine (NORVASC) 2.5 MG tablet Take 2.5 mg by mouth daily.    Marland Kitchen amLODipine (NORVASC) 5 MG tablet     . atorvastatin (LIPITOR) 20 MG tablet     . atorvastatin (LIPITOR) 40 MG tablet Take 1 tablet (40 mg total) by mouth daily at 6 PM. 30 tablet 0  . brimonidine (ALPHAGAN) 0.15 % ophthalmic  solution Place 1 drop into both eyes 2 (two) times daily.     . clopidogrel (PLAVIX) 75 MG tablet Take 1 tablet (75 mg total) by mouth daily. 14 tablet 0  . ezetimibe (ZETIA) 10 MG tablet Take 10 mg by mouth at bedtime.     . famotidine (PEPCID) 20 MG tablet     . hydrochlorothiazide (HYDRODIURIL) 25 MG tablet     . latanoprost (XALATAN) 0.005 % ophthalmic solution Place 1 drop into both eyes at bedtime.    Marland Kitchen losartan (COZAAR) 25 MG tablet Take by mouth.    . losartan (COZAAR) 50 MG tablet     . Metoprolol Succinate 25 MG CS24 Take 25 mg by mouth daily.     Marland Kitchen senna-docusate (SENOKOT-S) 8.6-50 MG tablet Take 1 tablet by mouth at bedtime as needed for mild constipation. 30 tablet 0  . traZODone (DESYREL) 50 MG tablet      No current facility-administered medications on file prior to visit.    Allergies  Allergen Reactions  . Flagyl [Metronidazole] Itching and Swelling    Recent Results (from the past 2160 hour(s))  Ethanol     Status: None   Collection Time: 08/21/18 12:26 PM  Result Value Ref Range   Alcohol, Ethyl (B) <10 <10 mg/dL    Comment: (NOTE) Lowest detectable limit for serum alcohol is 10 mg/dL. For medical purposes only. Performed at [facility], [place]  [address] [place], [place] [address]   Protime-INR     Status: None   Collection Time: 08/21/18 12:26 PM  Result Value Ref Range   Prothrombin Time 15.1 11.4 - 15.2 seconds   INR 1.2 0.8 - 1.2    Comment: (NOTE) INR goal varies based on device and disease states. Performed at [facility], [place] [address], [place], [place] [address]   APTT     Status: None   Collection Time: 08/21/18 12:26 PM  Result Value Ref Range   aPTT 30 24 - 36 seconds    Comment: Performed at [facility] [address] [place], [place] [address]  CBC     Status: None   Collection Time: 08/21/18 12:26 PM  Result Value Ref Range   WBC 4.5 4.0 - 10.5 K/uL   RBC 4.00 3.87 - 5.11 MIL/uL   Hemoglobin 12.3 12.0 - 15.0  g/dL   HCT 37.2 36.0 - 46.0 %   MCV 93.0 80.0 - 100.0 fL   MCH 30.8 26.0 - 34.0 pg   MCHC 33.1 30.0 - 36.0 g/dL   RDW 13.5 11.5 - 15.5 %   Platelets 246 150 - 400 K/uL   nRBC 0.0 0.0 - 0.2 %    Comment: Performed at [facility], [place] [address], [place], [place] [address]  Differential     Status: None   Collection Time: 08/21/18 12:26 PM  Result Value Ref Range   Neutrophils Relative % 69 %   Neutro Abs 3.1 1.7 - 7.7 K/uL   Lymphocytes Relative 21 %   Lymphs Abs 0.9 0.7 - 4.0 K/uL   Monocytes Relative 9 %   Monocytes Absolute 0.4 0.1 - 1.0 K/uL   Eosinophils Relative 1 %   Eosinophils Absolute 0.0 0.0 - 0.5 K/uL   Basophils Relative 0 %   Basophils Absolute 0.0 0.0 - 0.1 K/uL   Immature Granulocytes 0 %   Abs Immature Granulocytes 0.01 0.00 - 0.07 K/uL    Comment: Performed at [facility] [address] [place],  [address]  Comprehensive metabolic panel     Status: Abnormal   Collection Time: 08/21/18 12:26 PM  Result Value Ref Range   Sodium 140 135 - 145 mmol/L   Potassium 3.9 3.5 - 5.1 mmol/L   Chloride 108 98 - 111 mmol/L   CO2 25 22 - 32 mmol/L   Glucose, Bld 131 (H) 70 - 99 mg/dL   BUN 10 8 - 23 mg/dL   Creatinine, Ser 0.73 0.44 - 1.00 mg/dL   Calcium 9.7 8.9 - 10.3 mg/dL   Total Protein 7.0 6.5 - 8.1 g/dL   Albumin 3.8 3.5 - 5.0 g/dL   AST 29 15 - 41 U/L   ALT 23 0 - 44 U/L   Alkaline Phosphatase 70 38 - 126 U/L   Total Bilirubin 0.6 0.3 - 1.2 mg/dL   GFR calc non Af Amer >60 >60 mL/min   GFR calc Af Amer >60 >60 mL/min   Anion gap 7 5 - 15    Comment: Performed at [facility] [address], [place],  [address]  I-stat chem 8, ED     Status: Abnormal   Collection Time: 08/21/18 12:47 PM  Result Value Ref Range   Sodium 141 135 - 145 mmol/L   Potassium 4.0 3.5 - 5.1 mmol/L   Chloride 107 98 - 111 mmol/L   BUN 11 8 - 23 mg/dL  Creatinine, Ser 0.70 0.44 - 1.00 mg/dL   Glucose, Bld 128 (H) 70 - 99 mg/dL   Calcium, Ion  1.34 1.15 - 1.40 mmol/L   TCO2 27 22 - 32 mmol/L   Hemoglobin 12.9 12.0 - 15.0 g/dL   HCT 38.0 36.0 - 46.0 %  Urine rapid drug screen (hosp performed)     Status: None   Collection Time: 08/21/18  1:25 PM  Result Value Ref Range   Opiates NONE DETECTED NONE DETECTED   Cocaine NONE DETECTED NONE DETECTED   Benzodiazepines NONE DETECTED NONE DETECTED   Amphetamines NONE DETECTED NONE DETECTED   Tetrahydrocannabinol NONE DETECTED NONE DETECTED   Barbiturates NONE DETECTED NONE DETECTED    Comment: (NOTE) DRUG SCREEN FOR MEDICAL PURPOSES ONLY.  IF CONFIRMATION IS NEEDED FOR ANY PURPOSE, NOTIFY LAB WITHIN 5 DAYS. LOWEST DETECTABLE LIMITS FOR URINE DRUG SCREEN Drug Class                     Cutoff (ng/mL) Amphetamine and metabolites    1000 Barbiturate and metabolites    200 Benzodiazepine                 220 Tricyclics and metabolites     300 Opiates and metabolites        300 Cocaine and metabolites        300 THC                            50 Performed at [facility], [place] [address] [place], [place] [address]   Urinalysis, Routine w reflex microscopic     Status: Abnormal   Collection Time: 08/21/18  1:25 PM  Result Value Ref Range   Color, Urine COLORLESS (A) YELLOW   APPearance CLEAR CLEAR   Specific Gravity, Urine 1.003 (L) 1.005 - 1.030   pH 7.0 5.0 - 8.0   Glucose, UA NEGATIVE NEGATIVE mg/dL   Hgb urine dipstick NEGATIVE NEGATIVE   Bilirubin Urine NEGATIVE NEGATIVE   Ketones, ur NEGATIVE NEGATIVE mg/dL   Protein, ur NEGATIVE NEGATIVE mg/dL   Nitrite NEGATIVE NEGATIVE   Leukocytes,Ua NEGATIVE NEGATIVE    Comment: Performed at [facility] [address] [place], [place] [address]    Objective: General: Patient is awake, alert, and oriented x 3 and in no acute distress.  Integument: Skin is warm, dry and supple bilateral. Right>Left hallux Nail is tender, long, thickened and dystrophic with subungual debris, consistent with onychomycosis, No open  lesions or preulcerative lesions present bilateral. Remaining integument unremarkable.  Vasculature:  Dorsalis Pedis pulse 2/4 bilateral. Posterior Tibial pulse  1/4 bilateral. Capillary fill time <3 sec 1-5 bilateral. Positive hair growth to the level of the digits.Temperature gradient within normal limits. No varicosities present bilateral. No edema present bilateral.   Neurology: The patient has intact sensation measured with a 5.07/10g Semmes Weinstein Monofilament at all pedal sites bilateral. No Babinski sign present bilateral.   Musculoskeletal: Mild right>left 1st toe pain at nail. No other symptomatic pedal deformities noted bilateral. Muscular strength 5/5 in all lower extremity muscular groups bilateral without pain on range of motion . No tenderness with calf compression bilateral.  Assessment and Plan: Problem List Items Addressed This Visit    None    Visit Diagnoses    Pain due to onychomycosis of toenails of both feet    -  Primary   Long term (current) use of anticoagulants          -  Examined patient. -Discussed and educated patient on diabetic foot care, especially with  regards to the vascular, neurological and musculoskeletal systems.  -Stressed the importance of good glycemic control and the detriment of not  controlling glucose levels in relation to the foot. -Mechanically debrided right and left1st toenails using sterile nail nipper and filed with dremel without incident  -Recommend Vicks VapoRub and daily filing of nail and soaking with vinegar -Answered all patient questions and advised patient to consider nail avulsion procedure if continues to be bothersome but will have to plan out because she is on blood thinners -Patient advised to call the office if any problems or questions arise in the meantime.  Landis Martins, DPM

## 2018-09-13 NOTE — Patient Instructions (Signed)
Vinegar soaks 1 cup of white distilled vinegar to 8 cups of warm water.  Soak 20 mins. May repeat soak up to two times per week.  If there is thickness to nails may file nails after soaks or after bath/shower with nail file and apply vaseline, neosporin, or vicks vapor rub. Apply daily to nails after filing for the best result.

## 2018-09-20 ENCOUNTER — Ambulatory Visit: Payer: Medicare Other | Admitting: Neurology

## 2018-09-29 DIAGNOSIS — H2511 Age-related nuclear cataract, right eye: Secondary | ICD-10-CM | POA: Diagnosis not present

## 2018-09-29 DIAGNOSIS — H25011 Cortical age-related cataract, right eye: Secondary | ICD-10-CM | POA: Diagnosis not present

## 2018-09-29 DIAGNOSIS — H25811 Combined forms of age-related cataract, right eye: Secondary | ICD-10-CM | POA: Diagnosis not present

## 2018-10-13 DIAGNOSIS — H2512 Age-related nuclear cataract, left eye: Secondary | ICD-10-CM | POA: Diagnosis not present

## 2018-10-13 DIAGNOSIS — H25812 Combined forms of age-related cataract, left eye: Secondary | ICD-10-CM | POA: Diagnosis not present

## 2018-10-13 DIAGNOSIS — H25012 Cortical age-related cataract, left eye: Secondary | ICD-10-CM | POA: Diagnosis not present

## 2018-10-19 ENCOUNTER — Other Ambulatory Visit: Payer: Self-pay

## 2018-10-19 DIAGNOSIS — Z20822 Contact with and (suspected) exposure to covid-19: Secondary | ICD-10-CM

## 2018-10-21 ENCOUNTER — Ambulatory Visit: Payer: Self-pay | Admitting: *Deleted

## 2018-10-21 LAB — NOVEL CORONAVIRUS, NAA

## 2018-10-21 NOTE — Telephone Encounter (Addendum)
Summary: COVID positive   Pt called to get COVID results.      See result note

## 2018-10-22 ENCOUNTER — Other Ambulatory Visit: Payer: Self-pay | Admitting: Critical Care Medicine

## 2018-10-22 DIAGNOSIS — Z20822 Contact with and (suspected) exposure to covid-19: Secondary | ICD-10-CM

## 2018-10-22 NOTE — Telephone Encounter (Signed)
Pt called and notified that she would need to be retested for COVID-19 due to first test being indeterrminate. Pt verbalized understanding and states she is going to be retested on Monday.

## 2018-10-22 NOTE — Progress Notes (Signed)
Being retested for Covid as [facility] result insufficient material to test and pt exposed to covid + daughter

## 2018-10-24 ENCOUNTER — Other Ambulatory Visit: Payer: Self-pay | Admitting: *Deleted

## 2018-10-24 DIAGNOSIS — R6889 Other general symptoms and signs: Secondary | ICD-10-CM | POA: Diagnosis not present

## 2018-10-24 DIAGNOSIS — Z20822 Contact with and (suspected) exposure to covid-19: Secondary | ICD-10-CM

## 2018-10-25 ENCOUNTER — Ambulatory Visit: Payer: Self-pay | Admitting: *Deleted

## 2018-10-25 LAB — NOVEL CORONAVIRUS, NAA: SARS-CoV-2, NAA: DETECTED — AB

## 2018-10-25 NOTE — Telephone Encounter (Signed)
Pt called with a question regarding her testing positive for covid-19. She understands that she should be in quarantine for 10 to 14 days. She denies having symptoms. Has hot flashes but nothing new. Advise her of all the s/s of covid. She voiced understanding. She wanted to know when she could tested again. Advised that she should be out of quarantine and but wait 14 days. She should also contact her provider and get his opinion of being retested. She voiced understanding. Will notify the [facility] Department.

## 2018-10-31 ENCOUNTER — Telehealth: Payer: Self-pay | Admitting: *Deleted

## 2018-10-31 NOTE — Telephone Encounter (Signed)
I talked with the nurse this morning and she told me I could stop my quarantine today.. I forgot what all she told me.  There is no documentation in her [facility] record indicating anyone from here called her.   I asked her if she was in touch with the health dept and she said yes they have been calling me and updating me.     Come to find out the nurse that called her was from [facility]. I gave her the number for the [facility] Dept as they are managing her care now.  She thanked me for my help.

## 2018-11-09 ENCOUNTER — Other Ambulatory Visit: Payer: Self-pay

## 2018-11-09 DIAGNOSIS — Z20822 Contact with and (suspected) exposure to covid-19: Secondary | ICD-10-CM

## 2018-11-10 LAB — NOVEL CORONAVIRUS, NAA: SARS-CoV-2, NAA: NOT DETECTED

## 2018-11-16 DIAGNOSIS — M1990 Unspecified osteoarthritis, unspecified site: Secondary | ICD-10-CM | POA: Diagnosis not present

## 2018-11-16 DIAGNOSIS — I10 Essential (primary) hypertension: Secondary | ICD-10-CM | POA: Diagnosis not present

## 2018-11-16 DIAGNOSIS — I639 Cerebral infarction, unspecified: Secondary | ICD-10-CM | POA: Diagnosis not present

## 2018-11-16 DIAGNOSIS — E785 Hyperlipidemia, unspecified: Secondary | ICD-10-CM | POA: Diagnosis not present

## 2018-11-17 ENCOUNTER — Other Ambulatory Visit: Payer: Self-pay | Admitting: Cardiology

## 2018-11-17 DIAGNOSIS — Z20822 Contact with and (suspected) exposure to covid-19: Secondary | ICD-10-CM

## 2018-11-19 LAB — NOVEL CORONAVIRUS, NAA: SARS-CoV-2, NAA: NOT DETECTED

## 2018-12-01 ENCOUNTER — Ambulatory Visit: Payer: Medicare Other | Admitting: Neurology

## 2018-12-13 ENCOUNTER — Other Ambulatory Visit: Payer: Self-pay

## 2018-12-13 ENCOUNTER — Ambulatory Visit: Payer: Medicare Other | Admitting: Sports Medicine

## 2018-12-13 ENCOUNTER — Ambulatory Visit: Payer: Medicare Other | Admitting: Neurology

## 2018-12-13 ENCOUNTER — Encounter: Payer: Self-pay | Admitting: Neurology

## 2018-12-13 ENCOUNTER — Encounter: Payer: Self-pay | Admitting: Sports Medicine

## 2018-12-13 VITALS — BP 141/79 | HR 72 | Temp 97.3°F | Ht 69.0 in | Wt 159.8 lb

## 2018-12-13 DIAGNOSIS — M79674 Pain in right toe(s): Secondary | ICD-10-CM

## 2018-12-13 DIAGNOSIS — B351 Tinea unguium: Secondary | ICD-10-CM | POA: Diagnosis not present

## 2018-12-13 DIAGNOSIS — Z7901 Long term (current) use of anticoagulants: Secondary | ICD-10-CM

## 2018-12-13 DIAGNOSIS — L600 Ingrowing nail: Secondary | ICD-10-CM

## 2018-12-13 DIAGNOSIS — I671 Cerebral aneurysm, nonruptured: Secondary | ICD-10-CM | POA: Diagnosis not present

## 2018-12-13 DIAGNOSIS — M79675 Pain in left toe(s): Secondary | ICD-10-CM | POA: Diagnosis not present

## 2018-12-13 NOTE — Patient Instructions (Signed)
I had a long d/w patient about her recent lacunar stroke, asymptomatic brain aneurysms,risk for recurrent stroke/TIAs, personally independently reviewed imaging studies and stroke evaluation results and answered questions.Continue Plavix  for secondary stroke prevention and maintain strict control of hypertension with blood pressure goal below 130/90, diabetes with hemoglobin A1c goal below 6.5% and lipids with LDL cholesterol goal below 70 mg/dL. I also advised the patient to eat a healthy diet with plenty of whole grains, cereals, fruits and vegetables, exercise regularly and maintain ideal body weight .continue conservative follow-up for asymptomatic brain aneurysms and check f/u   CT angio brain.Return for f/u in 1 year.

## 2018-12-13 NOTE — Progress Notes (Signed)
STROKE NEUROLOGY FOLLOW UP NOTE  NAME: [name] DOB: [birth year]  REASON FOR VISIT: stroke follow up HISTORY FROM: pt and chart  Today we had the pleasure of seeing [name] in follow-up at our Neurology Clinic. Pt was accompanied by no one.   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   She stated that she had stroke in 04/2005 but no residue and followed with Dr. Leonie Man in clinic, last seen 03/2015. For the last 6 months to a year, she started to have right sided neck pain, stiffness, with right shoulder and lateral arm pain with mildly limited ROM, with radiating pain to the forearm and right palm. Comes and goes, sometimes can be intense, but other time dull achy. It can happen every the other day. She also has LBP comes and goes, sometimes hard to get out of the car due to LBP.  However, last Monday, she had right side facial numbness, at the left cheek area, feels like norvacaine shot at dental office. The numbness lasted about one day and resolved. She denies any HA, weakness, speech or swallow difficulty, no arm/leg issues at that time. She is compliant with aggrenox and lipitor.  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 Patient has previously met with Dr. Estanislado Pandy three times to discuss endovascular treatment and chosen conservative monitoring and follow up.  She had HTN and stable taking meds, today BP 129/66. She denies DM and she check her glucose at home was 115-130.    Update 11/21/2015 : She returns for follow-up after last visit with Dr. showed 2 months  ago. She states she's not had any further episodes of numbness on cheek. She feels muscle tightness in the back of her head and neck but denies significant radicular pain. She did have an episode of her right hand fingers locking up after and morning when she was taking taken pending her wrist a lot in the kitchen. She did not undergo MRI scan the brain and cervical spine has been never got a call to schedule that but have now been scheduled for 12/02/15. Similarly CT angiogram of the brain for follow-up of aneurysms is also scheduled for the same day. Patient was seen by gastroenterologist Dr. Collene Mares today for rectal bleeding and is scheduled to undergo colonoscopy in December. She is concerned about risk of stroke and stopping Aggrenox for 5 days prior to the procedure.  Update 05/05/2016 : She returns for follow-up after last visit 6 months ago. She continues to do well without recurrent stroke or TIA symptoms. She had follow-up CT angiogram of the brain and neck done on 12/02/15 which showed stable appearance of her intracranial aneurysms. Patient states his started walking regularly and has lost 20 pounds. She occasionally hears her heartbeat in the ears particularly at night when she is lying down quietly. She denies significant stress. She is also noticed some intermittent numbness in the lower lateral aspect of the left foot. She does agree that she sleeps on her left side a lot. She denies significant back pain or radicular  pain. She has not had any recurrent stroke or TIA symptoms. She states her blood pressure is well controlled today it is 120/63. Update 11/29/2017 : She returns for follow-up after last visit a year ago.  She continues to do well without definite stroke or TIA symptoms now for greater than 10 years.  She was however seen in the emergency room on 06/29/2017 and she woke up with tingling in the left hand as well as left foot.  She was seen by neurologist Dr. Cheral Marker who felt her symptoms are  not comparable with stroke.  She did have a prior history of carpal tunnel and hand numbness seems similar to this.  I had previously seen her for left foot numbness which I thought was related to peroneal entrapment neuropathy.  Patient had a CT scan of the head done which showed no acute abnormality.  Patient states the symptoms have not recurred since then.  She remains on Aggrenox which is tolerating well without side effects.  Her blood pressure fluctuates a bit at times it is in the low 110 range but today it is 140/78.  She takes metoprolol 25 mg daily.  She states her lipid profile is well controlled and she is on Lipitor 20 and Zetia.  She does follow-up regularly with a primary physician.  She had CT angiogram of the brain done on 12/28/2016 which showed stable appearance of her intracranial aneurysms.  She is due for a follow-up CTA next month. Update 03/22/2018 : She returns for follow-up after last visit 3 months ago.  She was hospitalized for a small thalamic lacunar infarct in December 2019.  She complained of some numbness and incoordination of her hand as well as felt jittery and tremulous.  The symptoms are similar to her previous stroke in 2007 and she went to the hospital.  MRI scan of the brain was obtained which showed a tiny acute lacunar infarct in the left thalamus.  Carotid ultrasound was unremarkable.  She had recently had CT angiogram of the brain in November 2019 for follow-up of her asymptomatic left cavernous and pericallosal aneurysm since it was not repeated.  Echocardiogram showed normal ejection fraction.  LDL cholesterol was 95 mg percent.  Hemoglobin A1c was 5.7.  Patient had been on Aggrenox following her previous stroke since 2007.  This was changed to aspirin and Plavix.  She was advised to stop aspirin after 3 weeks was Ms. misunderstood this and is currently on both.  She is tolerating them well without bruising or bleeding.  She states her numbness and clumsiness has  resolved completely.  She has no focal deficits.  She is on Lipitor now 40 mg daily.  She has not had any follow-up lipid profile checked.  She has been keeping track of her blood pressure and is worried that her morning systolic ranges in the 448J range.  She has an upcoming visit with her primary care physician Dr. Concha Pyo and plans to discuss this.  She is also noticed some lumps beneath her right jaw which are probably enlarged lymph nodes.  She denies any significant tooth pain, recurrent sinus infections. Update 12/13/2018 : She returns for follow-up after last visit 9 months ago.  She states she is doing well she has had no further recurrent stroke or TIA symptoms.  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  She still has some intermittent anxiety and tremulousness and she gets nervous.  Her blood pressure tends to fluctuate but when  she is relaxed it is normal.  Today it is slightly elevated in the office at 152/86.  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  She states she was had a chronic reflux symptoms over the last few months and was tested for Covid multiple times.  She was positive initially then next time indeterminate and then positive again.  She isolated herself a few weeks.  Subsequently she is been tested twice and has stayed negative.  Interestingly she had multiple family members were also tested positive in the time but nobody had any symptoms of Covid infection and were not sick requiring any hospitalization.  She remains on Plavix which is tolerating well without bruising or bleeding.  She has no new complaints.  Her last CT angiogram of the brain to follow-up on aneurysms was a year ago. REVIEW OF SYSTEMS: Full 14 system review of systems performed and notable only for those listed below and in HPI above, all others are negative:     Palpitations, cataracts, joint pain, muscle cramps, insomnia, not enough sleep all othersystems  negative The following represents the patient's updated allergies and side effects list: Allergies  Allergen Reactions  . Flagyl [Metronidazole] Itching and Swelling    The neurologically relevant items on the patient's problem list were reviewed on today's visit.  Neurologic Examination  A problem focused neurological exam (12 or more points of the single system neurologic examination, vital signs counts as 1 point, cranial nerves count for 8 points) was performed.  Blood pressure (!) 141/79, pulse 72, temperature (!) 97.3 F (36.3 C), height '5\' 9"'$  (1.753 m), weight 72.5 kg.  General - Well nourished, well developed elderly African-American lady, in no apparent distress.  Ophthalmologic - funduscopic exam not done   Cardiovascular - Regular rate and rhythm with no murmur.  Mental Status -  Level of arousal and orientation to time, place, and person were intact. Language including expression, naming, repetition, comprehension was assessed and found intact. Fund of Knowledge was assessed and was intact.  Cranial Nerves II - XII - II - Visual field intact OU. III, IV, VI - Extraocular movements intact. V - Facial sensation intact bilaterally. VII - Facial movement intact bilaterally. VIII - Hearing & vestibular intact bilaterally. X - Palate elevates symmetrically. XI - Chin turning & shoulder shrug intact bilaterally. XII - Tongue protrusion intact.  Motor Strength - The patient's strength was normal in all extremities and pronator drift was absent.  Bulk was normal and fasciculations were absent.   Motor Tone - Muscle tone was assessed at the neck and appendages and was normal.  Reflexes - The patient's reflexes were 1+ in all extremities and she had no pathological reflexes.  Sensory - Light touch, temperature/pinprick were assessed and were normal.    Coordination - The patient had normal movements in the hands and feet with no ataxia or dysmetria.  Tremor was  absent.  Gait and Station - The patient's transfers, posture, gait, station, and turns were observed as normal.  Mild difficulty in tandem walking  Data reviewed: I personally reviewed the images and agree with the radiology interpretations.  CT angio brain and neck 12/02/2015 :  1. 8 mm left cavernous ICA aneurysm is unchanged compared to 08/18/2014. 2. 2-3 mm right ophthalmic ICA aneurysm is unchanged from prior. 3. 1-2 mm outpouching from the supraclinoid left ICA is stable and favors infundibulum. CT angio head and neck 12/28/2016 :  1. Unchanged  8 mm left cavernous and 3 mm right paraophthalmic ICA Aneurysms. 2. Unchanged 1-2 mm left supraclinoid ICA outpouching suggesting an infundibulum. Ct Head 06/29/2017 : Mild atrophy and small vessel disease. Old LEFT centrum semiovale lacunar infarct. No acute findings. Assessment:   she is a 73 y.o. 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  Transient episode of left hand and feet paresthesias of unclear etiology in June 2019.  History of mild carpal tunnel syndrome and peroneal neuropathy which appears stable.  Recent right thalamic lacunar infarct in December 2019 from which she has made a full recovery. Plan:  I had a long d/w patient about her recent lacunar stroke, asymptomatic brain aneurysms,risk for recurrent stroke/TIAs, personally independently reviewed imaging studies and stroke evaluation results and answered questions.Continue Plavix but discontinue aspirin as it has been greater than 3 weeks of dual antiplatelet therapy for secondary stroke prevention and maintain strict control of hypertension with blood pressure goal below 130/90, diabetes with hemoglobin A1c goal below 6.5% and lipids with LDL cholesterol goal below 70 mg/dL. I  also advised the patient to eat a healthy diet with plenty of whole grains, cereals, fruits and vegetables, exercise regularly and maintain ideal body weight .continue conservative follow-up for asymptomatic brain aneurysms with yearly CT angios. Will repeat Ct angio for brain aneurysm f/u. surveillance.Greater than 50% time during this 25 minute visit was spent on counseling and coordination of care about her thalamic lacunar infarct, asymptomatic cerebral aneurysms, stroke and TIA risk, left foot peroneal neuropathy and answering questions  . Antony Contras, MD  [facility] [address] [place], [place] [address]  Phone [phone] Fax [phone]  Antony Contras, MD [facility] [address] [place], [place] [address] [phone]

## 2018-12-13 NOTE — Progress Notes (Signed)
Subjective: [name] is a 73 y.o. female patient with history of PVD on Plavix for TIA history who presents to office today complaining of long,mildly painful toenails difficult to trim.  Patient denies any new changes in medication or new problems.  Patient Active Problem List   Diagnosis Date Noted  . Atrophic vaginitis 09/13/2018  . Uterine leiomyoma 09/13/2018  . Gastroesophageal reflux disease without esophagitis 07/25/2018  . Globus pharyngeus 07/25/2018  . Submandibular swelling 07/25/2018  . Essential hypertension 01/25/2018  . HLD (hyperlipidemia) 01/25/2018  . Anxiety 01/25/2018  . CVA (cerebral vascular accident) ([place]) 01/24/2018  . Disorder of shoulder 03/18/2017  . Pain in joint of right shoulder 03/18/2017  . TIA (transient ischemic attack) 09/20/2015  . DDD (degenerative disc disease), cervical 09/20/2015  . History of stroke 09/20/2015  . Aneurysm, cerebral, nonruptured 03/28/2015  . Bilateral leg pain 10/26/2011  . Edema of lower extremity 10/26/2011  . Pelvic pain in female 08/25/2011  . Yeast infection   . Trichomonas   . Cystic breast   . Stroke ([place])   . History of chicken pox   . History of measles   . History of mumps    Current Outpatient Medications on File Prior to Visit  Medication Sig Dispense Refill  . acetaminophen (TYLENOL) 500 MG tablet Take 500 mg by mouth every 6 (six) hours as needed for mild pain or moderate pain.     Marland Kitchen ALPRAZolam (XANAX) 1 MG tablet Take 1 mg by mouth 2 (two) times daily as needed for anxiety.     Marland Kitchen amLODipine (NORVASC) 2.5 MG tablet Take 2.5 mg by mouth daily.    Marland Kitchen atorvastatin (LIPITOR) 40 MG tablet Take 1 tablet (40 mg total) by mouth daily at 6 PM. 30 tablet 0  . brimonidine (ALPHAGAN) 0.15 % ophthalmic solution Place 1 drop into both eyes 2 (two) times daily.     . clopidogrel (PLAVIX) 75 MG tablet Take 1 tablet (75 mg total) by mouth daily. 14 tablet 0  . ezetimibe (ZETIA) 10 MG tablet Take 10 mg by mouth  at bedtime.     . hydrochlorothiazide (HYDRODIURIL) 25 MG tablet     . latanoprost (XALATAN) 0.005 % ophthalmic solution Place 1 drop into both eyes at bedtime.    Marland Kitchen losartan (COZAAR) 50 MG tablet     . Metoprolol Succinate 25 MG CS24 Take 25 mg by mouth daily.     Marland Kitchen senna-docusate (SENOKOT-S) 8.6-50 MG tablet Take 1 tablet by mouth at bedtime as needed for mild constipation. 30 tablet 0   No current facility-administered medications on file prior to visit.    Allergies  Allergen Reactions  . Flagyl [Metronidazole] Itching and Swelling    Recent Results (from the past 2160 hour(s))  Novel Coronavirus, NAA ([facility])     Status: Abnormal   Collection Time: 10/19/18  8:59 AM   Specimen: Oropharyngeal(OP) collection in vial transport medium   OROPHARYNGEA  SCREENIN  Result Value Ref Range   SARS-CoV-2, NAA Comment (A) Not Detected    Comment: Indeterminate We are UNABLE to reliably determine a result for the specimen due to the inconsistent amplification of all of the required SARS-CoV-2 components from the specimen submitted. If clinically indicated, please recollect an additional specimen for testing. This nucleic acid amplification test was developed and its performance characteristics determined by [employer]. Nucleic acid amplification tests include PCR and TMA. This test has not been FDA cleared or approved. This test has been authorized by FDA  under an Emergency Use Authorization (EUA). This test is only authorized for the duration of time the declaration that circumstances exist justifying the authorization of the emergency use of in vitro diagnostic tests for detection of SARS-CoV-2 virus and/or diagnosis of COVID-19 infection under section 564(b)(1) of the Act, 21 U.S.C. GF:7541899) (1), unless the authorization is terminated or revoked sooner. When diagnostic testing is negative, the pos sibility of a false negative result should be considered in the context of  a patient's recent exposures and the presence of clinical signs and symptoms consistent with COVID-19. An individual without symptoms of COVID-19 and who is not shedding SARS-CoV-2 virus would expect to have a negative (not detected) result in this assay.   Novel Coronavirus, NAA ([facility])     Status: Abnormal   Collection Time: 10/24/18 12:00 AM   Specimen: Oropharyngeal(OP) collection in vial transport medium   OROPHARYNGEA  TESTING  Result Value Ref Range   SARS-CoV-2, NAA Detected (A) Not Detected    Comment: This nucleic acid amplification test was developed and its performance characteristics determined by [employer]. Nucleic acid amplification tests include PCR and TMA. This test has not been FDA cleared or approved. This test has been authorized by FDA under an Emergency Use Authorization (EUA). This test is only authorized for the duration of time the declaration that circumstances exist justifying the authorization of the emergency use of in vitro diagnostic tests for detection of SARS-CoV-2 virus and/or diagnosis of COVID-19 infection under section 564(b)(1) of the Act, 21 U.S.C. GF:7541899) (1), unless the authorization is terminated or revoked sooner. When diagnostic testing is negative, the possibility of a false negative result should be considered in the context of a patient's recent exposures and the presence of clinical signs and symptoms consistent with COVID-19. An individual without symptoms of COVID-19 and who is not shedding SARS-CoV-2 virus would  expect to have a negative (not detected) result in this assay.   Novel Coronavirus, NAA ([facility])     Status: None   Collection Time: 11/09/18  9:40 AM   Specimen: Nasopharyngeal(NP) swabs in vial transport medium   NASOPHARYNGE  TESTING  Result Value Ref Range   SARS-CoV-2, NAA Not Detected Not Detected    Comment: This nucleic acid amplification test was developed and its performance characteristics  determined by [employer]. Nucleic acid amplification tests include PCR and TMA. This test has not been FDA cleared or approved. This test has been authorized by FDA under an Emergency Use Authorization (EUA). This test is only authorized for the duration of time the declaration that circumstances exist justifying the authorization of the emergency use of in vitro diagnostic tests for detection of SARS-CoV-2 virus and/or diagnosis of COVID-19 infection under section 564(b)(1) of the Act, 21 U.S.C. GF:7541899) (1), unless the authorization is terminated or revoked sooner. When diagnostic testing is negative, the possibility of a false negative result should be considered in the context of a patient's recent exposures and the presence of clinical signs and symptoms consistent with COVID-19. An individual without symptoms of COVID-19 and who is not shedding SARS-CoV-2 virus would  expect to have a negative (not detected) result in this assay.   Novel Coronavirus, NAA ([facility])     Status: None   Collection Time: 11/17/18 12:00 AM   Specimen: Nasopharyngeal(NP) swabs in vial transport medium   NASOPHARYNGE  SCREENIN  Result Value Ref Range   SARS-CoV-2, NAA Not Detected Not Detected    Comment: This nucleic acid amplification test was  developed and its performance characteristics determined by [employer]. Nucleic acid amplification tests include PCR and TMA. This test has not been FDA cleared or approved. This test has been authorized by FDA under an Emergency Use Authorization (EUA). This test is only authorized for the duration of time the declaration that circumstances exist justifying the authorization of the emergency use of in vitro diagnostic tests for detection of SARS-CoV-2 virus and/or diagnosis of COVID-19 infection under section 564(b)(1) of the Act, 21 U.S.C. GF:7541899) (1), unless the authorization is terminated or revoked sooner. When diagnostic  testing is negative, the possibility of a false negative result should be considered in the context of a patient's recent exposures and the presence of clinical signs and symptoms consistent with COVID-19. An individual without symptoms of COVID-19 and who is not shedding SARS-CoV-2 virus would  expect to have a negative (not detected) result in this assay.     Objective: General: Patient is awake, alert, and oriented x 3 and in no acute distress.  Integument: Skin is warm, dry and supple bilateral. Right>Left hallux and lesser toenails are tender, long, thickened and dystrophic with subungual debris, consistent with onychomycosis, No open lesions or preulcerative lesions present bilateral. Remaining integument unremarkable.  Vasculature:  Dorsalis Pedis pulse 2/4 bilateral. Posterior Tibial pulse  1/4 bilateral. Capillary fill time <3 sec 1-5 bilateral. Positive hair growth to the level of the digits.Temperature gradient within normal limits. No varicosities present bilateral. No edema present bilateral.   Neurology: The patient has intact sensation measured with a 5.07/10g Semmes Weinstein Monofilament at all pedal sites bilateral. No Babinski sign present bilateral.   Musculoskeletal: Mild right>left 1st toe pain at nails. No other symptomatic pedal deformities noted bilateral. Muscular strength 5/5 in all lower extremity muscular groups bilateral without pain on range of motion. No tenderness with calf compression bilateral.  Assessment and Plan: Problem List Items Addressed This Visit    None    Visit Diagnoses    Pain due to onychomycosis of toenails of both feet    -  Primary   Long term (current) use of anticoagulants       Onychocryptosis          -Examined patient. -Mechanically debrided toenails x 10 using sterile nail nipper and filed with dremel without incident  -Answered all patient questions and advised patient to consider nail avulsion procedure if continues to be  bothersome but will have to plan out because she is on blood thinners like before patient prefers at this time to continue with conservative care at this time -Patient advised to call the office if any problems or questions arise in the meantime. -Return in 3 months for nail trim  Landis Martins, DPM

## 2018-12-21 DIAGNOSIS — H401132 Primary open-angle glaucoma, bilateral, moderate stage: Secondary | ICD-10-CM | POA: Diagnosis not present

## 2019-01-05 ENCOUNTER — Other Ambulatory Visit: Payer: Self-pay

## 2019-01-05 ENCOUNTER — Ambulatory Visit
Admission: RE | Admit: 2019-01-05 | Discharge: 2019-01-05 | Disposition: A | Discharge status: Home / Self Care | Payer: Medicare Other | Source: Ambulatory Visit | Attending: Neurology | Admitting: Neurology

## 2019-01-05 DIAGNOSIS — I671 Cerebral aneurysm, nonruptured: Secondary | ICD-10-CM | POA: Diagnosis not present

## 2019-01-05 MED ORDER — IOPAMIDOL (ISOVUE-370) INJECTION 76%
75.0000 mL | Freq: Once | INTRAVENOUS | Status: AC | PRN
  Administered 2019-01-05: 08:00:00 75 mL via INTRAVENOUS

## 2019-01-09 ENCOUNTER — Telehealth: Payer: Self-pay | Admitting: *Deleted

## 2019-01-09 NOTE — Telephone Encounter (Signed)
I was able to speak to the patient.  She verbalized understanding of her results.

## 2019-01-09 NOTE — Telephone Encounter (Signed)
-----   Message from Garvin Fila, MD sent at 01/09/2019  3:30 PM EST ----- Kindly inform patient that CT angio brain shows stable appearance of both the 2 aneurysms in brain .nothing new to worry about

## 2019-01-12 ENCOUNTER — Other Ambulatory Visit: Payer: Self-pay | Admitting: Cardiology

## 2019-01-12 DIAGNOSIS — Z20822 Contact with and (suspected) exposure to covid-19: Secondary | ICD-10-CM

## 2019-01-13 ENCOUNTER — Other Ambulatory Visit: Payer: Medicare Other

## 2019-01-13 LAB — NOVEL CORONAVIRUS, NAA: SARS-CoV-2, NAA: NOT DETECTED

## 2019-01-25 DIAGNOSIS — I639 Cerebral infarction, unspecified: Secondary | ICD-10-CM | POA: Diagnosis not present

## 2019-01-25 DIAGNOSIS — M1990 Unspecified osteoarthritis, unspecified site: Secondary | ICD-10-CM | POA: Diagnosis not present

## 2019-01-25 DIAGNOSIS — I10 Essential (primary) hypertension: Secondary | ICD-10-CM | POA: Diagnosis not present

## 2019-01-25 DIAGNOSIS — E785 Hyperlipidemia, unspecified: Secondary | ICD-10-CM | POA: Diagnosis not present

## 2019-02-27 DIAGNOSIS — L659 Nonscarring hair loss, unspecified: Secondary | ICD-10-CM | POA: Diagnosis not present

## 2019-02-28 ENCOUNTER — Telehealth: Payer: Self-pay | Admitting: Neurology

## 2019-02-28 NOTE — Telephone Encounter (Signed)
Spoke with patient and informed her of Dr Guadelupe Sabin reply. She agreed to call Dr Shelia Media, PCP as well,  verbalized understanding, appreciation.

## 2019-02-28 NOTE — Telephone Encounter (Signed)
I reviewed her chart, from the neurological standpoint I do not see a reason why she should not be able to take the Covid vaccine, she is encouraged to talk to her primary care physician as well. Please call patient back.

## 2019-02-28 NOTE — Telephone Encounter (Signed)
Patient is wanting to know if it is ok for her to receive the COVID Vaccine

## 2019-03-01 ENCOUNTER — Ambulatory Visit: Payer: Medicare HMO | Attending: Internal Medicine

## 2019-03-01 DIAGNOSIS — Z20822 Contact with and (suspected) exposure to covid-19: Secondary | ICD-10-CM

## 2019-03-02 LAB — NOVEL CORONAVIRUS, NAA: SARS-CoV-2, NAA: NOT DETECTED

## 2019-03-09 DIAGNOSIS — Z86010 Personal history of colonic polyps: Secondary | ICD-10-CM | POA: Diagnosis not present

## 2019-03-09 DIAGNOSIS — M940 Chondrocostal junction syndrome [Tietze]: Secondary | ICD-10-CM | POA: Diagnosis not present

## 2019-03-14 DIAGNOSIS — Z01419 Encounter for gynecological examination (general) (routine) without abnormal findings: Secondary | ICD-10-CM | POA: Diagnosis not present

## 2019-04-12 DIAGNOSIS — H04123 Dry eye syndrome of bilateral lacrimal glands: Secondary | ICD-10-CM | POA: Diagnosis not present

## 2019-04-12 DIAGNOSIS — H401132 Primary open-angle glaucoma, bilateral, moderate stage: Secondary | ICD-10-CM | POA: Diagnosis not present

## 2019-04-12 DIAGNOSIS — H43812 Vitreous degeneration, left eye: Secondary | ICD-10-CM | POA: Diagnosis not present

## 2019-04-15 DIAGNOSIS — Z20822 Contact with and (suspected) exposure to covid-19: Secondary | ICD-10-CM | POA: Diagnosis not present

## 2019-04-15 DIAGNOSIS — Z20828 Contact with and (suspected) exposure to other viral communicable diseases: Secondary | ICD-10-CM | POA: Diagnosis not present

## 2019-04-17 DIAGNOSIS — Z20828 Contact with and (suspected) exposure to other viral communicable diseases: Secondary | ICD-10-CM | POA: Diagnosis not present

## 2019-04-17 DIAGNOSIS — Z03818 Encounter for observation for suspected exposure to other biological agents ruled out: Secondary | ICD-10-CM | POA: Diagnosis not present

## 2019-04-25 ENCOUNTER — Other Ambulatory Visit: Payer: Self-pay

## 2019-04-25 ENCOUNTER — Ambulatory Visit: Payer: Medicare HMO | Admitting: Sports Medicine

## 2019-04-25 ENCOUNTER — Encounter: Payer: Self-pay | Admitting: Sports Medicine

## 2019-04-25 VITALS — Temp 97.0°F

## 2019-04-25 DIAGNOSIS — M79675 Pain in left toe(s): Secondary | ICD-10-CM

## 2019-04-25 DIAGNOSIS — Z7901 Long term (current) use of anticoagulants: Secondary | ICD-10-CM

## 2019-04-25 DIAGNOSIS — M79674 Pain in right toe(s): Secondary | ICD-10-CM

## 2019-04-25 DIAGNOSIS — B351 Tinea unguium: Secondary | ICD-10-CM

## 2019-04-25 NOTE — Progress Notes (Signed)
Subjective: [name] is a 74 y.o. female patient with history of PVD on Plavix for TIA history who presents to office today complaining of long,mildly painful toenails difficult to trim like before.  Patient denies any new changes in medication or new problems.  Patient Active Problem List   Diagnosis Date Noted  . Atrophic vaginitis 09/13/2018  . Uterine leiomyoma 09/13/2018  . Gastroesophageal reflux disease without esophagitis 07/25/2018  . Globus pharyngeus 07/25/2018  . Submandibular swelling 07/25/2018  . Essential hypertension 01/25/2018  . HLD (hyperlipidemia) 01/25/2018  . Anxiety 01/25/2018  . CVA (cerebral vascular accident) ([place]) 01/24/2018  . Disorder of shoulder 03/18/2017  . Pain in joint of right shoulder 03/18/2017  . TIA (transient ischemic attack) 09/20/2015  . DDD (degenerative disc disease), cervical 09/20/2015  . History of stroke 09/20/2015  . Aneurysm, cerebral, nonruptured 03/28/2015  . Bilateral leg pain 10/26/2011  . Edema of lower extremity 10/26/2011  . Pelvic pain in female 08/25/2011  . Yeast infection   . Trichomonas   . Cystic breast   . Stroke ([place])   . History of chicken pox   . History of measles   . History of mumps    Current Outpatient Medications on File Prior to Visit  Medication Sig Dispense Refill  . acetaminophen (TYLENOL) 500 MG tablet Take 500 mg by mouth every 6 (six) hours as needed for mild pain or moderate pain.     Marland Kitchen ALPRAZolam (XANAX) 1 MG tablet Take 1 mg by mouth 2 (two) times daily as needed for anxiety.     Marland Kitchen amLODipine (NORVASC) 2.5 MG tablet Take 2.5 mg by mouth daily.    Marland Kitchen atorvastatin (LIPITOR) 40 MG tablet Take 1 tablet (40 mg total) by mouth daily at 6 PM. 30 tablet 0  . brimonidine (ALPHAGAN) 0.15 % ophthalmic solution Place 1 drop into both eyes 2 (two) times daily.     . clopidogrel (PLAVIX) 75 MG tablet Take 1 tablet (75 mg total) by mouth daily. 14 tablet 0  . ezetimibe (ZETIA) 10 MG tablet Take 10  mg by mouth at bedtime.     . hydrochlorothiazide (HYDRODIURIL) 25 MG tablet     . latanoprost (XALATAN) 0.005 % ophthalmic solution Place 1 drop into both eyes at bedtime.    Marland Kitchen losartan (COZAAR) 50 MG tablet     . Metoprolol Succinate 25 MG CS24 Take 25 mg by mouth daily.     Marland Kitchen senna-docusate (SENOKOT-S) 8.6-50 MG tablet Take 1 tablet by mouth at bedtime as needed for mild constipation. 30 tablet 0   No current facility-administered medications on file prior to visit.   Allergies  Allergen Reactions  . Flagyl [Metronidazole] Itching and Swelling    Recent Results (from the past 2160 hour(s))  Novel Coronavirus, NAA ([facility])     Status: None   Collection Time: 03/01/19 10:15 AM   Specimen: Nasopharyngeal(NP) swabs in vial transport medium   NASOPHARYNGE  TESTING  Result Value Ref Range   SARS-CoV-2, NAA Not Detected Not Detected    Comment: This nucleic acid amplification test was developed and its performance characteristics determined by [employer]. Nucleic acid amplification tests include RT-PCR and TMA. This test has not been FDA cleared or approved. This test has been authorized by FDA under an Emergency Use Authorization (EUA). This test is only authorized for the duration of time the declaration that circumstances exist justifying the authorization of the emergency use of in vitro diagnostic tests for detection of SARS-CoV-2 virus  and/or diagnosis of COVID-19 infection under section 564(b)(1) of the Act, 21 U.S.C. [id number]) (1), unless the authorization is terminated or revoked sooner. When diagnostic testing is negative, the possibility of a false negative result should be considered in the context of a patient's recent exposures and the presence of clinical signs and symptoms consistent with COVID-19. An individual without symptoms of COVID-19 and who is not shedding SARS-CoV-2 virus wo uld expect to have a negative (not detected) result in this assay.      Objective: General: Patient is awake, alert, and oriented x 3 and in no acute distress.  Integument: Skin is warm, dry and supple bilateral. Right>Left hallux and lesser toenails are tender, long, thickened and dystrophic with subungual debris, consistent with onychomycosis, No open lesions or preulcerative lesions present bilateral. Remaining integument unremarkable.  Vasculature:  Dorsalis Pedis pulse 2/4 bilateral. Posterior Tibial pulse  1/4 bilateral. Capillary fill time <3 sec 1-5 bilateral. Positive hair growth to the level of the digits.Temperature gradient within normal limits. No varicosities present bilateral. No edema present bilateral.   Neurology: The patient has intact sensation measured with a 5.07/10g Semmes Weinstein Monofilament at all pedal sites bilateral. No Babinski sign present bilateral.   Musculoskeletal: Mild right>left 1st toe pain at nails. No other symptomatic pedal deformities noted bilateral. Muscular strength 5/5 in all lower extremity muscular groups bilateral without pain on range of motion. No tenderness with calf compression bilateral.  Assessment and Plan: Problem List Items Addressed This Visit    None    Visit Diagnoses    Pain due to onychomycosis of toenails of both feet    -  Primary   Long term (current) use of anticoagulants          -Examined patient. -Mechanically debrided toenails x 10 using sterile nail nipper and filed with dremel without incident  -Continue with close monitoring of feet in the setting of anticoagulant use -Patient advised to call the office if any problems or questions arise in the meantime. -Return in 3 months for nail trim  Landis Martins, DPM

## 2019-05-02 ENCOUNTER — Ambulatory Visit: Payer: Medicare Other | Admitting: Sports Medicine

## 2019-05-17 DIAGNOSIS — I10 Essential (primary) hypertension: Secondary | ICD-10-CM | POA: Diagnosis not present

## 2019-05-17 DIAGNOSIS — M1990 Unspecified osteoarthritis, unspecified site: Secondary | ICD-10-CM | POA: Diagnosis not present

## 2019-05-17 DIAGNOSIS — R002 Palpitations: Secondary | ICD-10-CM | POA: Diagnosis not present

## 2019-05-17 DIAGNOSIS — E785 Hyperlipidemia, unspecified: Secondary | ICD-10-CM | POA: Diagnosis not present

## 2019-05-17 DIAGNOSIS — I639 Cerebral infarction, unspecified: Secondary | ICD-10-CM | POA: Diagnosis not present

## 2019-05-18 DIAGNOSIS — E785 Hyperlipidemia, unspecified: Secondary | ICD-10-CM | POA: Diagnosis not present

## 2019-05-18 DIAGNOSIS — R002 Palpitations: Secondary | ICD-10-CM | POA: Diagnosis not present

## 2019-05-18 DIAGNOSIS — I10 Essential (primary) hypertension: Secondary | ICD-10-CM | POA: Diagnosis not present

## 2019-05-24 DIAGNOSIS — H401132 Primary open-angle glaucoma, bilateral, moderate stage: Secondary | ICD-10-CM | POA: Diagnosis not present

## 2019-05-24 DIAGNOSIS — H43813 Vitreous degeneration, bilateral: Secondary | ICD-10-CM | POA: Diagnosis not present

## 2019-05-25 ENCOUNTER — Telehealth: Payer: Self-pay | Admitting: Neurology

## 2019-05-25 ENCOUNTER — Other Ambulatory Visit: Payer: Self-pay

## 2019-05-25 MED ORDER — CLOPIDOGREL BISULFATE 75 MG PO TABS: 75.0000 mg | ORAL_TABLET | Freq: Every day | ORAL | 3 refills | Status: DC

## 2019-05-25 NOTE — Telephone Encounter (Signed)
Refill plavix sent to [facility] with 90 day supply and 3 refills.This refill should last pt a whole year unless she changes insurance.

## 2019-05-25 NOTE — Telephone Encounter (Signed)
Pt is asking that RN calls her insurance company [phone]: her clopidogrel (PLAVIX) 75 MG tablet.  Pt states they do not have this medication listed for her, pt states she has about a weeks worth remaining and does not want to risk running out.

## 2019-06-01 DIAGNOSIS — M1712 Unilateral primary osteoarthritis, left knee: Secondary | ICD-10-CM | POA: Diagnosis not present

## 2019-06-01 DIAGNOSIS — M25562 Pain in left knee: Secondary | ICD-10-CM | POA: Diagnosis not present

## 2019-06-01 DIAGNOSIS — M179 Osteoarthritis of knee, unspecified: Secondary | ICD-10-CM | POA: Insufficient documentation

## 2019-06-05 DIAGNOSIS — D649 Anemia, unspecified: Secondary | ICD-10-CM | POA: Diagnosis not present

## 2019-06-05 DIAGNOSIS — E7800 Pure hypercholesterolemia, unspecified: Secondary | ICD-10-CM | POA: Diagnosis not present

## 2019-06-05 DIAGNOSIS — R7303 Prediabetes: Secondary | ICD-10-CM | POA: Diagnosis not present

## 2019-06-05 DIAGNOSIS — I6523 Occlusion and stenosis of bilateral carotid arteries: Secondary | ICD-10-CM | POA: Diagnosis not present

## 2019-06-05 DIAGNOSIS — I10 Essential (primary) hypertension: Secondary | ICD-10-CM | POA: Diagnosis not present

## 2019-06-05 DIAGNOSIS — Z7901 Long term (current) use of anticoagulants: Secondary | ICD-10-CM | POA: Diagnosis not present

## 2019-06-08 DIAGNOSIS — Z8673 Personal history of transient ischemic attack (TIA), and cerebral infarction without residual deficits: Secondary | ICD-10-CM | POA: Diagnosis not present

## 2019-06-08 DIAGNOSIS — F5104 Psychophysiologic insomnia: Secondary | ICD-10-CM | POA: Diagnosis not present

## 2019-06-08 DIAGNOSIS — R7303 Prediabetes: Secondary | ICD-10-CM | POA: Diagnosis not present

## 2019-06-08 DIAGNOSIS — I10 Essential (primary) hypertension: Secondary | ICD-10-CM | POA: Diagnosis not present

## 2019-06-08 DIAGNOSIS — E7800 Pure hypercholesterolemia, unspecified: Secondary | ICD-10-CM | POA: Diagnosis not present

## 2019-06-08 DIAGNOSIS — Z0000 Encounter for general adult medical examination without abnormal findings: Secondary | ICD-10-CM | POA: Diagnosis not present

## 2019-06-12 ENCOUNTER — Ambulatory Visit: Payer: Medicare HMO | Attending: Internal Medicine

## 2019-06-12 DIAGNOSIS — Z20822 Contact with and (suspected) exposure to covid-19: Secondary | ICD-10-CM

## 2019-06-13 LAB — SARS-COV-2, NAA 2 DAY TAT

## 2019-06-13 LAB — NOVEL CORONAVIRUS, NAA: SARS-CoV-2, NAA: NOT DETECTED

## 2019-06-20 DIAGNOSIS — K625 Hemorrhage of anus and rectum: Secondary | ICD-10-CM | POA: Diagnosis not present

## 2019-06-20 DIAGNOSIS — R194 Change in bowel habit: Secondary | ICD-10-CM | POA: Diagnosis not present

## 2019-06-20 DIAGNOSIS — R140 Abdominal distension (gaseous): Secondary | ICD-10-CM | POA: Diagnosis not present

## 2019-06-20 DIAGNOSIS — Z86010 Personal history of colonic polyps: Secondary | ICD-10-CM | POA: Diagnosis not present

## 2019-07-24 DIAGNOSIS — H524 Presbyopia: Secondary | ICD-10-CM | POA: Diagnosis not present

## 2019-07-24 DIAGNOSIS — H52223 Regular astigmatism, bilateral: Secondary | ICD-10-CM | POA: Diagnosis not present

## 2019-07-25 ENCOUNTER — Other Ambulatory Visit: Payer: Self-pay

## 2019-07-25 ENCOUNTER — Encounter: Payer: Self-pay | Admitting: Sports Medicine

## 2019-07-25 ENCOUNTER — Ambulatory Visit: Payer: Medicare HMO | Admitting: Sports Medicine

## 2019-07-25 VITALS — Temp 97.2°F

## 2019-07-25 DIAGNOSIS — B351 Tinea unguium: Secondary | ICD-10-CM | POA: Diagnosis not present

## 2019-07-25 DIAGNOSIS — M79674 Pain in right toe(s): Secondary | ICD-10-CM | POA: Diagnosis not present

## 2019-07-25 DIAGNOSIS — M79675 Pain in left toe(s): Secondary | ICD-10-CM | POA: Diagnosis not present

## 2019-07-25 DIAGNOSIS — Z7901 Long term (current) use of anticoagulants: Secondary | ICD-10-CM | POA: Diagnosis not present

## 2019-07-25 NOTE — Progress Notes (Signed)
Subjective: [name] is a 74 y.o. female patient with history of PVD on Plavix still for TIA history who presents to office today complaining of long,mildly painful toenails difficult to trim. Patient denies any new changes in medication or new problems but does admit to a little swelling in ankles.  Patient Active Problem List   Diagnosis Date Noted  . Atrophic vaginitis 09/13/2018  . Uterine leiomyoma 09/13/2018  . Gastroesophageal reflux disease without esophagitis 07/25/2018  . Globus pharyngeus 07/25/2018  . Submandibular swelling 07/25/2018  . Essential hypertension 01/25/2018  . HLD (hyperlipidemia) 01/25/2018  . Anxiety 01/25/2018  . CVA (cerebral vascular accident) ([place]) 01/24/2018  . Disorder of shoulder 03/18/2017  . Pain in joint of right shoulder 03/18/2017  . TIA (transient ischemic attack) 09/20/2015  . DDD (degenerative disc disease), cervical 09/20/2015  . History of stroke 09/20/2015  . Aneurysm, cerebral, nonruptured 03/28/2015  . Bilateral leg pain 10/26/2011  . Edema of lower extremity 10/26/2011  . Pelvic pain in female 08/25/2011  . Yeast infection   . Trichomonas   . Cystic breast   . Stroke ([place])   . History of chicken pox   . History of measles   . History of mumps    Current Outpatient Medications on File Prior to Visit  Medication Sig Dispense Refill  . acetaminophen (TYLENOL) 500 MG tablet Take 500 mg by mouth every 6 (six) hours as needed for mild pain or moderate pain.     Marland Kitchen ALPRAZolam (XANAX) 1 MG tablet Take 1 mg by mouth 2 (two) times daily as needed for anxiety.     Marland Kitchen amLODipine (NORVASC) 2.5 MG tablet Take 2.5 mg by mouth daily.    Marland Kitchen atorvastatin (LIPITOR) 40 MG tablet Take 1 tablet (40 mg total) by mouth daily at 6 PM. 30 tablet 0  . betamethasone dipropionate 0.05 % cream     . brimonidine (ALPHAGAN) 0.15 % ophthalmic solution Place 1 drop into both eyes 2 (two) times daily.     . clopidogrel (PLAVIX) 75 MG tablet Take 1 tablet  (75 mg total) by mouth daily. 90 tablet 3  . ezetimibe (ZETIA) 10 MG tablet Take 10 mg by mouth at bedtime.     . hydrochlorothiazide (HYDRODIURIL) 25 MG tablet     . latanoprost (XALATAN) 0.005 % ophthalmic solution Place 1 drop into both eyes at bedtime.    Marland Kitchen losartan (COZAAR) 50 MG tablet     . metoprolol succinate (TOPROL-XL) 50 MG 24 hr tablet     . senna-docusate (SENOKOT-S) 8.6-50 MG tablet Take 1 tablet by mouth at bedtime as needed for mild constipation. 30 tablet 0  . valsartan-hydrochlorothiazide (DIOVAN-HCT) 160-12.5 MG tablet      No current facility-administered medications on file prior to visit.   Allergies  Allergen Reactions  . Flagyl [Metronidazole] Itching and Swelling    Recent Results (from the past 2160 hour(s))  Novel Coronavirus, NAA ([facility])     Status: None   Collection Time: 06/12/19 10:29 AM   Specimen: Nasopharyngeal(NP) swabs in vial transport medium   NASOPHARYNGE  TESTING  Result Value Ref Range   SARS-CoV-2, NAA Not Detected Not Detected    Comment: This nucleic acid amplification test was developed and its performance characteristics determined by [employer], [employer] and [employer]. Nucleic acid amplification tests include RT-PCR and TMA. This test has not been FDA cleared or approved. This test has been authorized by FDA under an Emergency Use Authorization (EUA). This test is only authorized for the duration  of time the declaration that circumstances exist justifying the authorization of the emergency use of in vitro diagnostic tests for detection of SARS-CoV-2 virus and/or diagnosis of COVID-19 infection under section 564(b)(1) of the Act, 21 U.S.C. 502DXA-1(O) (1), unless the authorization is terminated or revoked sooner. When diagnostic testing is negative, the possibility of a false negative result should be considered in the context of a patient's recent exposures and the presence of clinical signs and symptoms consistent with COVID-19. An individual  without symptoms of COVID-19 and who is not shedding SARS-CoV-2 virus wo uld expect to have a negative (not detected) result in this assay.   SARS-COV-2, NAA 2 DAY TAT     Status: None   Collection Time: 06/12/19 10:29 AM   NASOPHARYNGE  TESTING  Result Value Ref Range   SARS-CoV-2, NAA 2 DAY TAT Performed     Objective: General: Patient is awake, alert, and oriented x 3 and in no acute distress.  Integument: Skin is warm, dry and supple bilateral. Right>Left hallux and lesser toenails are tender, long, thickened and dystrophic with subungual debris, consistent with onychomycosis, No open lesions or preulcerative lesions present bilateral. Remaining integument unremarkable.  Vasculature:  Dorsalis Pedis pulse 2/4 bilateral. Posterior Tibial pulse  1/4 bilateral. Capillary fill time <3 sec 1-5 bilateral. Positive hair growth to the level of the digits.Temperature gradient within normal limits. No varicosities present bilateral. Trace edema present bilateral.   Neurology: The patient has intact sensation measured with a 5.07/10g Semmes Weinstein Monofilament at all pedal sites bilateral. No Babinski sign present bilateral.   Musculoskeletal: Mild pain at nails right>left 1st toe. No other symptomatic pedal deformities noted bilateral. Muscular strength 5/5 in all lower extremity muscular groups bilateral without pain on range of motion. No tenderness with calf compression bilateral.  Assessment and Plan: Problem List Items Addressed This Visit    None    Visit Diagnoses    Pain due to onychomycosis of toenails of both feet    -  Primary   Long term (current) use of anticoagulants          -Examined patient. -Mechanically debrided toenails x 10 using sterile nail nipper and filed with dremel without incident  -Continue with good supportive shoes daily for foot type and elevation for edema in ankles and monitoring salt intake -Patient advised to call the office if any problems or  questions arise in the meantime. -Return in 3 months for nail trim  Landis Martins, DPM

## 2019-08-28 DIAGNOSIS — L659 Nonscarring hair loss, unspecified: Secondary | ICD-10-CM | POA: Diagnosis not present

## 2019-09-12 ENCOUNTER — Other Ambulatory Visit: Payer: Self-pay

## 2019-09-12 ENCOUNTER — Other Ambulatory Visit: Payer: Medicare HMO

## 2019-09-12 DIAGNOSIS — Z20822 Contact with and (suspected) exposure to covid-19: Secondary | ICD-10-CM | POA: Diagnosis not present

## 2019-09-13 LAB — NOVEL CORONAVIRUS, NAA: SARS-CoV-2, NAA: NOT DETECTED

## 2019-09-13 LAB — SARS-COV-2, NAA 2 DAY TAT

## 2019-09-14 DIAGNOSIS — D72819 Decreased white blood cell count, unspecified: Secondary | ICD-10-CM | POA: Diagnosis not present

## 2019-09-14 DIAGNOSIS — Z8673 Personal history of transient ischemic attack (TIA), and cerebral infarction without residual deficits: Secondary | ICD-10-CM | POA: Diagnosis not present

## 2019-09-14 DIAGNOSIS — R7303 Prediabetes: Secondary | ICD-10-CM | POA: Diagnosis not present

## 2019-09-14 DIAGNOSIS — I10 Essential (primary) hypertension: Secondary | ICD-10-CM | POA: Diagnosis not present

## 2019-09-14 DIAGNOSIS — R5383 Other fatigue: Secondary | ICD-10-CM | POA: Diagnosis not present

## 2019-09-14 DIAGNOSIS — Z7901 Long term (current) use of anticoagulants: Secondary | ICD-10-CM | POA: Diagnosis not present

## 2019-09-14 DIAGNOSIS — E7800 Pure hypercholesterolemia, unspecified: Secondary | ICD-10-CM | POA: Diagnosis not present

## 2019-09-14 DIAGNOSIS — F5104 Psychophysiologic insomnia: Secondary | ICD-10-CM | POA: Diagnosis not present

## 2019-10-17 DIAGNOSIS — H401132 Primary open-angle glaucoma, bilateral, moderate stage: Secondary | ICD-10-CM | POA: Diagnosis not present

## 2019-10-17 DIAGNOSIS — Z961 Presence of intraocular lens: Secondary | ICD-10-CM | POA: Diagnosis not present

## 2019-10-17 DIAGNOSIS — H52203 Unspecified astigmatism, bilateral: Secondary | ICD-10-CM | POA: Diagnosis not present

## 2019-10-17 DIAGNOSIS — H43813 Vitreous degeneration, bilateral: Secondary | ICD-10-CM | POA: Diagnosis not present

## 2019-10-23 DIAGNOSIS — E7800 Pure hypercholesterolemia, unspecified: Secondary | ICD-10-CM | POA: Diagnosis not present

## 2019-10-23 DIAGNOSIS — Z8673 Personal history of transient ischemic attack (TIA), and cerebral infarction without residual deficits: Secondary | ICD-10-CM | POA: Diagnosis not present

## 2019-10-26 ENCOUNTER — Other Ambulatory Visit: Payer: Self-pay

## 2019-10-26 ENCOUNTER — Ambulatory Visit (INDEPENDENT_AMBULATORY_CARE_PROVIDER_SITE_OTHER): Payer: Medicare HMO | Admitting: Sports Medicine

## 2019-10-26 ENCOUNTER — Encounter: Payer: Self-pay | Admitting: Sports Medicine

## 2019-10-26 DIAGNOSIS — B351 Tinea unguium: Secondary | ICD-10-CM | POA: Diagnosis not present

## 2019-10-26 DIAGNOSIS — M79674 Pain in right toe(s): Secondary | ICD-10-CM

## 2019-10-26 DIAGNOSIS — Z7901 Long term (current) use of anticoagulants: Secondary | ICD-10-CM | POA: Diagnosis not present

## 2019-10-26 DIAGNOSIS — M79675 Pain in left toe(s): Secondary | ICD-10-CM | POA: Diagnosis not present

## 2019-10-26 NOTE — Progress Notes (Signed)
Subjective: [name] is a 74 y.o. female patient with history of PVD on Plavix still for TIA history who returns to office today complaining of long,mildly painful toenails difficult to trim. Patient denies any new changes in medication or new problems but does admit to a little swelling in ankles like previous.  Patient Active Problem List   Diagnosis Date Noted  . Osteoarthritis of knee 06/01/2019  . Atrophic vaginitis 09/13/2018  . Uterine leiomyoma 09/13/2018  . Gastroesophageal reflux disease without esophagitis 07/25/2018  . Globus pharyngeus 07/25/2018  . Submandibular swelling 07/25/2018  . Essential hypertension 01/25/2018  . HLD (hyperlipidemia) 01/25/2018  . Anxiety 01/25/2018  . CVA (cerebral vascular accident) ([place]) 01/24/2018  . Disorder of shoulder 03/18/2017  . Pain in joint of right shoulder 03/18/2017  . TIA (transient ischemic attack) 09/20/2015  . DDD (degenerative disc disease), cervical 09/20/2015  . History of stroke 09/20/2015  . Aneurysm, cerebral, nonruptured 03/28/2015  . Bilateral leg pain 10/26/2011  . Edema of lower extremity 10/26/2011  . Pelvic pain in female 08/25/2011  . Yeast infection   . Trichomonas   . Cystic breast   . Stroke ([place])   . History of chicken pox   . History of measles   . History of mumps    Current Outpatient Medications on File Prior to Visit  Medication Sig Dispense Refill  . acetaminophen (TYLENOL) 500 MG tablet Take 500 mg by mouth every 6 (six) hours as needed for mild pain or moderate pain.     Marland Kitchen ALPRAZolam (XANAX) 1 MG tablet Take 1 mg by mouth 2 (two) times daily as needed for anxiety.     Marland Kitchen amLODipine (NORVASC) 2.5 MG tablet Take 2.5 mg by mouth daily.    Marland Kitchen atorvastatin (LIPITOR) 40 MG tablet Take 1 tablet (40 mg total) by mouth daily at 6 PM. 30 tablet 0  . betamethasone dipropionate 0.05 % cream     . Blood Glucose Monitoring Suppl (TRUE METRIX METER) w/Device KIT     . brimonidine (ALPHAGAN) 0.15 %  ophthalmic solution Place 1 drop into both eyes 2 (two) times daily.     . clopidogrel (PLAVIX) 75 MG tablet Take 1 tablet (75 mg total) by mouth daily. 90 tablet 3  . ezetimibe (ZETIA) 10 MG tablet Take 10 mg by mouth at bedtime.     . hydrochlorothiazide (HYDRODIURIL) 25 MG tablet     . latanoprost (XALATAN) 0.005 % ophthalmic solution Place 1 drop into both eyes at bedtime.    Marland Kitchen losartan (COZAAR) 50 MG tablet     . metoprolol succinate (TOPROL-XL) 50 MG 24 hr tablet     . senna-docusate (SENOKOT-S) 8.6-50 MG tablet Take 1 tablet by mouth at bedtime as needed for mild constipation. 30 tablet 0  . TRUE METRIX BLOOD GLUCOSE TEST test strip     . valsartan-hydrochlorothiazide (DIOVAN-HCT) 160-12.5 MG tablet      No current facility-administered medications on file prior to visit.   Allergies  Allergen Reactions  . Flagyl [Metronidazole] Itching and Swelling    Recent Results (from the past 2160 hour(s))  Novel Coronavirus, NAA ([facility])     Status: None   Collection Time: 09/12/19  9:42 AM   Specimen: Nasopharyngeal(NP) swabs in vial transport medium   Nasopharynge  Screenin  Result Value Ref Range   SARS-CoV-2, NAA Not Detected Not Detected    Comment: This nucleic acid amplification test was developed and its performance characteristics determined by [employer], [employer] and [employer]. Nucleic acid amplification  tests include RT-PCR and TMA. This test has not been FDA cleared or approved. This test has been authorized by FDA under an Emergency Use Authorization (EUA). This test is only authorized for the duration of time the declaration that circumstances exist justifying the authorization of the emergency use of in vitro diagnostic tests for detection of SARS-CoV-2 virus and/or diagnosis of COVID-19 infection under section 564(b)(1) of the Act, 21 U.S.C. 716RCV-8(L) (1), unless the authorization is terminated or revoked sooner. When diagnostic testing is negative, the possibility of a  false negative result should be considered in the context of a patient's recent exposures and the presence of clinical signs and symptoms consistent with COVID-19. An individual without symptoms of COVID-19 and who is not shedding SARS-CoV-2 virus wo uld expect to have a negative (not detected) result in this assay.   SARS-COV-2, NAA 2 DAY TAT     Status: None   Collection Time: 09/12/19  9:42 AM   Nasopharynge  Screenin  Result Value Ref Range   SARS-CoV-2, NAA 2 DAY TAT Performed     Objective: General: Patient is awake, alert, and oriented x 3 and in no acute distress.  Integument: Skin is warm, dry and supple bilateral. Right>Left hallux and lesser toenails are tender, long, thickened and dystrophic with subungual debris, consistent with onychomycosis, No open lesions or preulcerative lesions present bilateral. Remaining integument unremarkable.  Vasculature:  Dorsalis Pedis pulse 2/4 bilateral. Posterior Tibial pulse  1/4 bilateral. Capillary fill time <3 sec 1-5 bilateral. Positive hair growth to the level of the digits.Temperature gradient within normal limits. No varicosities present bilateral. Trace edema present bilateral.   Neurology: The patient has intact sensation measured with a 5.07/10g Semmes Weinstein Monofilament at all pedal sites bilateral. No Babinski sign present bilateral.   Musculoskeletal: Minimal pain at nails right>left 1st toe. No other symptomatic pedal deformities noted bilateral. Muscular strength 5/5 in all lower extremity muscular groups bilateral without pain on range of motion. No tenderness with calf compression bilateral.  Assessment and Plan: Problem List Items Addressed This Visit    None    Visit Diagnoses    Pain due to onychomycosis of toenails of both feet    -  Primary   Long term (current) use of anticoagulants         -Examined patient. -Mechanically debrided toenails x 10 using sterile nail nipper and filed with dremel without  incident  -Continue with good supportive shoes daily for foot type and elevation for edema in ankles and monitoring salt intake and wearing compression garments that she has -Patient advised to call the office if any problems or questions arise in the meantime. -Return in 3 months for nail trim  Landis Martins, DPM

## 2019-11-30 ENCOUNTER — Telehealth: Payer: Self-pay | Admitting: Neurology

## 2019-11-30 ENCOUNTER — Ambulatory Visit: Payer: Medicare HMO | Attending: Internal Medicine

## 2019-11-30 ENCOUNTER — Other Ambulatory Visit: Payer: Self-pay | Admitting: Neurology

## 2019-11-30 DIAGNOSIS — I671 Cerebral aneurysm, nonruptured: Secondary | ICD-10-CM

## 2019-11-30 DIAGNOSIS — Z23 Encounter for immunization: Secondary | ICD-10-CM

## 2019-11-30 NOTE — Telephone Encounter (Signed)
Pt has called to report her November appointment has been pushed out until 02-2020.  Pt is asking if Dr Leonie Man can go ahead and still schedule her annual MRI for the state of her aneurysm.  Pt does not want to wait until Feb for that.

## 2019-11-30 NOTE — Telephone Encounter (Signed)
Okay I ordered CT angiogram of the brain for aneurysm follow-up

## 2019-11-30 NOTE — Progress Notes (Signed)
   Covid-19 Vaccination [facility]  Name:  [name]    MRN: [id number] DOB: Sep 06, 1945  11/30/2019  [name] was observed post Covid-19 immunization for 15 minutes without incident. She was provided with Vaccine Information Sheet and instruction to access the V-Safe system.   [name] was instructed to call 911 with any severe reactions post vaccine: Marland Kitchen Difficulty breathing  . Swelling of face and throat  . A fast heartbeat  . A bad rash all over body  . Dizziness and weakness

## 2019-12-04 NOTE — Telephone Encounter (Signed)
Left message on VM notifying her that Dr. Leonie Man has ordered her CT Angiogram and she should be getting call for scheduling.  If she has any questions to please call me back.

## 2019-12-06 DIAGNOSIS — Z8673 Personal history of transient ischemic attack (TIA), and cerebral infarction without residual deficits: Secondary | ICD-10-CM | POA: Diagnosis not present

## 2019-12-06 DIAGNOSIS — I10 Essential (primary) hypertension: Secondary | ICD-10-CM | POA: Diagnosis not present

## 2019-12-06 DIAGNOSIS — E7800 Pure hypercholesterolemia, unspecified: Secondary | ICD-10-CM | POA: Diagnosis not present

## 2019-12-06 DIAGNOSIS — R7303 Prediabetes: Secondary | ICD-10-CM | POA: Diagnosis not present

## 2019-12-06 DIAGNOSIS — D649 Anemia, unspecified: Secondary | ICD-10-CM | POA: Diagnosis not present

## 2019-12-13 ENCOUNTER — Ambulatory Visit: Payer: Medicare Other | Admitting: Neurology

## 2019-12-20 DIAGNOSIS — Z8673 Personal history of transient ischemic attack (TIA), and cerebral infarction without residual deficits: Secondary | ICD-10-CM | POA: Diagnosis not present

## 2019-12-20 DIAGNOSIS — E7800 Pure hypercholesterolemia, unspecified: Secondary | ICD-10-CM | POA: Diagnosis not present

## 2019-12-25 NOTE — Telephone Encounter (Signed)
Mcarthur Rossetti Josem Kaufmann: [phone] (exp. 12/25/19 to 01/24/20) order sent to [facility]. They will reach out to the patient to schedule.

## 2019-12-25 NOTE — Telephone Encounter (Signed)
Pt has called to report that she did not get the message from 11-08.  Pt also states she has not heard from anyone re: the scheduling of her CT Angiogram, please call.

## 2019-12-25 NOTE — Telephone Encounter (Signed)
Ill work on it now but it never dropped in my workque.

## 2019-12-26 ENCOUNTER — Other Ambulatory Visit: Payer: Self-pay | Admitting: Neurology

## 2019-12-26 DIAGNOSIS — I671 Cerebral aneurysm, nonruptured: Secondary | ICD-10-CM

## 2020-01-01 DIAGNOSIS — I639 Cerebral infarction, unspecified: Secondary | ICD-10-CM | POA: Diagnosis not present

## 2020-01-01 DIAGNOSIS — I2510 Atherosclerotic heart disease of native coronary artery without angina pectoris: Secondary | ICD-10-CM | POA: Diagnosis not present

## 2020-01-01 DIAGNOSIS — E785 Hyperlipidemia, unspecified: Secondary | ICD-10-CM | POA: Diagnosis not present

## 2020-01-01 DIAGNOSIS — I10 Essential (primary) hypertension: Secondary | ICD-10-CM | POA: Diagnosis not present

## 2020-01-03 ENCOUNTER — Other Ambulatory Visit: Payer: Medicare HMO

## 2020-01-04 ENCOUNTER — Ambulatory Visit
Admission: RE | Admit: 2020-01-04 | Discharge: 2020-01-04 | Disposition: A | Discharge status: Home / Self Care | Payer: Medicare HMO | Source: Ambulatory Visit | Attending: Neurology | Admitting: Neurology

## 2020-01-04 DIAGNOSIS — I720 Aneurysm of carotid artery: Secondary | ICD-10-CM | POA: Diagnosis not present

## 2020-01-04 DIAGNOSIS — I671 Cerebral aneurysm, nonruptured: Secondary | ICD-10-CM

## 2020-01-04 MED ORDER — IOPAMIDOL (ISOVUE-370) INJECTION 76%
75.0000 mL | Freq: Once | INTRAVENOUS | Status: AC | PRN
  Administered 2020-01-04: 75 mL via INTRAVENOUS

## 2020-01-05 NOTE — Progress Notes (Signed)
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

## 2020-01-08 ENCOUNTER — Encounter: Payer: Self-pay | Admitting: *Deleted

## 2020-01-24 DIAGNOSIS — Z8673 Personal history of transient ischemic attack (TIA), and cerebral infarction without residual deficits: Secondary | ICD-10-CM | POA: Diagnosis not present

## 2020-01-24 DIAGNOSIS — E7800 Pure hypercholesterolemia, unspecified: Secondary | ICD-10-CM | POA: Diagnosis not present

## 2020-02-19 DIAGNOSIS — H401132 Primary open-angle glaucoma, bilateral, moderate stage: Secondary | ICD-10-CM | POA: Diagnosis not present

## 2020-02-19 DIAGNOSIS — H04123 Dry eye syndrome of bilateral lacrimal glands: Secondary | ICD-10-CM | POA: Diagnosis not present

## 2020-02-23 DIAGNOSIS — Z1231 Encounter for screening mammogram for malignant neoplasm of breast: Secondary | ICD-10-CM | POA: Diagnosis not present

## 2020-03-04 ENCOUNTER — Encounter: Payer: Self-pay | Admitting: Neurology

## 2020-03-04 ENCOUNTER — Ambulatory Visit: Payer: Medicare HMO | Admitting: Neurology

## 2020-03-04 ENCOUNTER — Other Ambulatory Visit: Payer: Self-pay

## 2020-03-04 VITALS — BP 160/72 | HR 62 | Ht 66.0 in | Wt 169.2 lb

## 2020-03-04 DIAGNOSIS — I671 Cerebral aneurysm, nonruptured: Secondary | ICD-10-CM | POA: Diagnosis not present

## 2020-03-04 NOTE — Progress Notes (Signed)
STROKE NEUROLOGY FOLLOW UP NOTE  NAME: [name] DOB: [birth year]  REASON FOR VISIT: stroke follow up HISTORY FROM: pt and chart  Today we had the pleasure of seeing [name] in follow-up at our Neurology Clinic. Pt was accompanied by no one.   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   She stated that she had stroke in 04/2005 but no residue and followed with Dr. Leonie Man in clinic, last seen 03/2015. For the last 6 months to a year, she started to have right sided neck pain, stiffness, with right shoulder and lateral arm pain with mildly limited ROM, with radiating pain to the forearm and right palm. Comes and goes, sometimes can be intense, but other time dull achy. It can happen every the other day. She also has LBP comes and goes, sometimes hard to get out of the car due to LBP.  However, last Monday, she had right side facial numbness, at the left cheek area, feels like norvacaine shot at dental office. The numbness lasted about one day and resolved. She denies any HA, weakness, speech or swallow difficulty, no arm/leg issues at that time. She is compliant with aggrenox and lipitor.  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 Patient has previously met with Dr. Estanislado Pandy three times to discuss endovascular treatment and chosen conservative monitoring and follow up.  She had HTN and stable taking meds, today BP 129/66. She denies DM and she check her glucose at home was 115-130.    Update 11/21/2015 : She returns for follow-up after last visit with Dr. showed 2 months  ago. She states she's not had any further episodes of numbness on cheek. She feels muscle tightness in the back of her head and neck but denies significant radicular pain. She did have an episode of her right hand fingers locking up after and morning when she was taking taken pending her wrist a lot in the kitchen. She did not undergo MRI scan the brain and cervical spine has been never got a call to schedule that but have now been scheduled for 12/02/15. Similarly CT angiogram of the brain for follow-up of aneurysms is also scheduled for the same day. Patient was seen by gastroenterologist Dr. Collene Mares today for rectal bleeding and is scheduled to undergo colonoscopy in December. She is concerned about risk of stroke and stopping Aggrenox for 5 days prior to the procedure.  Update 05/05/2016 : She returns for follow-up after last visit 6 months ago. She continues to do well without recurrent stroke or TIA symptoms. She had follow-up CT angiogram of the brain and neck done on 12/02/15 which showed stable appearance of her intracranial aneurysms. Patient states his started walking regularly and has lost 20 pounds. She occasionally hears her heartbeat in the ears particularly at night when she is lying down quietly. She denies significant stress. She is also noticed some intermittent numbness in the lower lateral aspect of the left foot. She does agree that she sleeps on her left side a lot. She denies significant back pain or radicular  pain. She has not had any recurrent stroke or TIA symptoms. She states her blood pressure is well controlled today it is 120/63. Update 11/29/2017 : She returns for follow-up after last visit a year ago.  She continues to do well without definite stroke or TIA symptoms now for greater than 10 years.  She was however seen in the emergency room on 06/29/2017 and she woke up with tingling in the left hand as well as left foot.  She was seen by neurologist Dr. Cheral Marker who felt her symptoms are  not comparable with stroke.  She did have a prior history of carpal tunnel and hand numbness seems similar to this.  I had previously seen her for left foot numbness which I thought was related to peroneal entrapment neuropathy.  Patient had a CT scan of the head done which showed no acute abnormality.  Patient states the symptoms have not recurred since then.  She remains on Aggrenox which is tolerating well without side effects.  Her blood pressure fluctuates a bit at times it is in the low 110 range but today it is 140/78.  She takes metoprolol 25 mg daily.  She states her lipid profile is well controlled and she is on Lipitor 20 and Zetia.  She does follow-up regularly with a primary physician.  She had CT angiogram of the brain done on 12/28/2016 which showed stable appearance of her intracranial aneurysms.  She is due for a follow-up CTA next month. Update 03/22/2018 : She returns for follow-up after last visit 3 months ago.  She was hospitalized for a small thalamic lacunar infarct in December 2019.  She complained of some numbness and incoordination of her hand as well as felt jittery and tremulous.  The symptoms are similar to her previous stroke in 2007 and she went to the hospital.  MRI scan of the brain was obtained which showed a tiny acute lacunar infarct in the left thalamus.  Carotid ultrasound was unremarkable.  She had recently had CT angiogram of the brain in November 2019 for follow-up of her asymptomatic left cavernous and pericallosal aneurysm since it was not repeated.  Echocardiogram showed normal ejection fraction.  LDL cholesterol was 95 mg percent.  Hemoglobin A1c was 5.7.  Patient had been on Aggrenox following her previous stroke since 2007.  This was changed to aspirin and Plavix.  She was advised to stop aspirin after 3 weeks was Ms. misunderstood this and is currently on both.  She is tolerating them well without bruising or bleeding.  She states her numbness and clumsiness has  resolved completely.  She has no focal deficits.  She is on Lipitor now 40 mg daily.  She has not had any follow-up lipid profile checked.  She has been keeping track of her blood pressure and is worried that her morning systolic ranges in the 448J range.  She has an upcoming visit with her primary care physician Dr. Concha Pyo and plans to discuss this.  She is also noticed some lumps beneath her right jaw which are probably enlarged lymph nodes.  She denies any significant tooth pain, recurrent sinus infections. Update 12/13/2018 : She returns for follow-up after last visit 9 months ago.  She states she is doing well she has had no further recurrent stroke or TIA symptoms.  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  She still has some intermittent anxiety and tremulousness and she gets nervous.  Her blood pressure tends to fluctuate but when  she is relaxed it is normal.  Today it is slightly elevated in the office at 152/86.  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  She states she was had a chronic reflux symptoms over the last few months and was tested for Covid multiple times.  She was positive initially then next time indeterminate and then positive again.  She isolated herself a few weeks.  Subsequently she is been tested twice and has stayed negative.  Interestingly she had multiple family members were also tested positive in the time but nobody had any symptoms of Covid infection and were not sick requiring any hospitalization.  She remains on Plavix which is tolerating well without bruising or bleeding.  She has no new complaints.  Her last CT angiogram of the brain to follow-up on aneurysms was a year ago. Update 03/04/2020 ; she returns for follow-up after last visit in November 2020.  She continues to do well and has not had recurrent TIA or stroke symptoms.  She remains on Plavix which is tolerating well without bruising or bleeding.  She remains on  Lipitor which is tolerating well as well without muscle aches and pains.  She had lipid profile checked 3 months ago her primary physician and her satisfactory.  Her blood pressure is well controlled at home usually but today it is elevated in office slightly at 160/72.  She had follow-up CT angiogram done on 01/04/2020 which I personally reviewed shows stable appearance of the 8 mm left cavernous carotid and 3 to 4 mm right cavernous carotid aneurysms.  She has no new complaints today REVIEW OF SYSTEMS: Full 14 system review of systems performed and notable only for those listed below and in HPI above, all others are negative:       joint pain, muscle cramps, insomnia, not enough sleep all othersystems negative The following represents the patient's updated allergies and side effects list: Allergies  Allergen Reactions  . Flagyl [Metronidazole] Itching and Swelling    The neurologically relevant items on the patient's problem list were reviewed on today's visit.  Neurologic Examination  A problem focused neurological exam (12 or more points of the single system neurologic examination, vital signs counts as 1 point, cranial nerves count for 8 points) was performed.  Blood pressure (!) 160/72, pulse 62, height _0  (1.676 m), weight 169 lb 3.2 oz (76.7 kg).  General - Well nourished, well developed elderly African-American lady, in no apparent distress.  Ophthalmologic - funduscopic exam not done   Cardiovascular - Regular rate and rhythm with no murmur.  Mental Status -  Level of arousal and orientation to time, place, and person were intact. Language including expression, naming, repetition, comprehension was assessed and found intact. Fund of Knowledge was assessed and was intact.  Cranial Nerves II - XII - II - Visual field intact OU. III, IV, VI - Extraocular movements intact. V - Facial sensation intact bilaterally. VII - Facial movement intact bilaterally. VIII - Hearing &  vestibular intact bilaterally. X - Palate elevates symmetrically. XI - Chin turning & shoulder shrug intact bilaterally. XII - Tongue protrusion intact.  Motor Strength - The patient's strength was normal in all extremities and pronator drift was absent.  Bulk was normal and fasciculations were absent.   Motor Tone - Muscle tone was assessed at the neck and appendages and was normal.  Reflexes - The patient's reflexes were 1+ in all extremities and she had no pathological  reflexes.  Sensory - Light touch, temperature/pinprick were assessed and were normal.    Coordination - The patient had normal movements in the hands and feet with no ataxia or dysmetria.  Tremor was absent.  Gait and Station - The patient's transfers, posture, gait, station, and turns were observed as normal.  Mild difficulty in tandem walking  Data reviewed: I personally reviewed the images and agree with the radiology interpretations.  CT angio brain and neck 12/02/2015 :  1. 8 mm left cavernous ICA aneurysm is unchanged compared to 08/18/2014. 2. 2-3 mm right ophthalmic ICA aneurysm is unchanged from prior. 3. 1-2 mm outpouching from the supraclinoid left ICA is stable and favors infundibulum. CT angio head and neck 12/28/2016 :  1. Unchanged 8 mm left cavernous and 3 mm right paraophthalmic ICA Aneurysms. 2. Unchanged 1-2 mm left supraclinoid ICA outpouching suggesting an infundibulum. Ct Head 06/29/2017 : Mild atrophy and small vessel disease. Old LEFT centrum semiovale lacunar infarct. No acute findings. Assessment:   she is a 75 y.o. African American female with PMH of HTN, hyperlipidemia brain aneurysm and left BG infarct 04/2005  And asymptomatic cerebral aneurysms which have remained stable since follow-up since 2007.  Last CT angio being in December 2021.  Transient episode of left hand and feet paresthesias of unclear etiology in June 2019.  History of mild carpal tunnel syndrome and peroneal neuropathy which  appears stable.  Recent right thalamic lacunar infarct in December 2019 from which she has made a full recovery. Plan:  I had a long discussion with the patient with regards to her remote history of lacunar stroke and asymptomatic cerebral aneurysms and answered questions.  Continue Plavix for secondary stroke prevention with strict control of hypertension blood pressure goal below 130/90, lipids with LDL cholesterol goal below 70 mg percent and diabetes with hemoglobin A1c goal below 6.5%.  Continue conservative follow-up for her asymptomatic bilateral cerebral aneurysms which appear to have remained stable since 2007 at least hence I recommend no need for yearly surveillance and to repeat CT angiogram now every 2 to 3 years.  She will return for follow-up with me in a year or call earlier if necessary.Greater than 50% time during this 30 minute visit was spent on counseling and coordination of care about her thalamic lacunar infarct, asymptomatic cerebral aneurysms, stroke and TIA risk, left foot peroneal neuropathy and answering questions  . Antony Contras, MD  [facility] [address] [place], [place] [address]  Phone [phone] Fax [phone]  Antony Contras, MD [facility] [address] [place], [place] [address] [phone]

## 2020-03-04 NOTE — Patient Instructions (Signed)
I had a long discussion with the patient with regards to her remote history of lacunar stroke and asymptomatic cerebral aneurysms and answered questions.  Continue Plavix for secondary stroke prevention with strict control of hypertension blood pressure goal below 130/90, lipids with LDL cholesterol goal below 70 mg percent and diabetes with hemoglobin A1c goal below 6.5%.  Continue conservative follow-up for her asymptomatic bilateral cerebral aneurysms which appear to have remained stable since 2007 at least hence I recommend no need for yearly surveillance and to repeat CT angiogram now every 2 to 3 years.  She will return for follow-up with me in a year or call earlier if necessary.

## 2020-03-12 ENCOUNTER — Other Ambulatory Visit: Payer: Medicare HMO

## 2020-03-12 ENCOUNTER — Other Ambulatory Visit: Payer: Self-pay

## 2020-03-12 DIAGNOSIS — Z20822 Contact with and (suspected) exposure to covid-19: Secondary | ICD-10-CM

## 2020-03-13 LAB — NOVEL CORONAVIRUS, NAA: SARS-CoV-2, NAA: NOT DETECTED

## 2020-03-13 LAB — SARS-COV-2, NAA 2 DAY TAT

## 2020-03-19 DIAGNOSIS — R109 Unspecified abdominal pain: Secondary | ICD-10-CM | POA: Diagnosis not present

## 2020-04-04 DIAGNOSIS — R109 Unspecified abdominal pain: Secondary | ICD-10-CM | POA: Diagnosis not present

## 2020-04-04 DIAGNOSIS — N200 Calculus of kidney: Secondary | ICD-10-CM | POA: Diagnosis not present

## 2020-04-04 DIAGNOSIS — K760 Fatty (change of) liver, not elsewhere classified: Secondary | ICD-10-CM | POA: Diagnosis not present

## 2020-04-08 DIAGNOSIS — M7502 Adhesive capsulitis of left shoulder: Secondary | ICD-10-CM | POA: Diagnosis not present

## 2020-04-24 DIAGNOSIS — M25512 Pain in left shoulder: Secondary | ICD-10-CM | POA: Diagnosis not present

## 2020-04-24 DIAGNOSIS — M25612 Stiffness of left shoulder, not elsewhere classified: Secondary | ICD-10-CM | POA: Diagnosis not present

## 2020-04-29 DIAGNOSIS — M25512 Pain in left shoulder: Secondary | ICD-10-CM | POA: Diagnosis not present

## 2020-04-29 DIAGNOSIS — M25612 Stiffness of left shoulder, not elsewhere classified: Secondary | ICD-10-CM | POA: Diagnosis not present

## 2020-05-01 ENCOUNTER — Other Ambulatory Visit: Payer: Self-pay | Admitting: Neurology

## 2020-05-01 DIAGNOSIS — I10 Essential (primary) hypertension: Secondary | ICD-10-CM | POA: Diagnosis not present

## 2020-05-01 DIAGNOSIS — I639 Cerebral infarction, unspecified: Secondary | ICD-10-CM | POA: Diagnosis not present

## 2020-05-01 DIAGNOSIS — R002 Palpitations: Secondary | ICD-10-CM | POA: Diagnosis not present

## 2020-05-01 DIAGNOSIS — I2510 Atherosclerotic heart disease of native coronary artery without angina pectoris: Secondary | ICD-10-CM | POA: Diagnosis not present

## 2020-05-01 DIAGNOSIS — E785 Hyperlipidemia, unspecified: Secondary | ICD-10-CM | POA: Diagnosis not present

## 2020-05-03 DIAGNOSIS — M25512 Pain in left shoulder: Secondary | ICD-10-CM | POA: Diagnosis not present

## 2020-05-03 DIAGNOSIS — M25612 Stiffness of left shoulder, not elsewhere classified: Secondary | ICD-10-CM | POA: Diagnosis not present

## 2020-05-06 IMAGING — CT CT ANGIO HEAD
2 of 4 series · 6 of 30 positions shown · IV contrast (APPLIED)
Comparison: Brain MRI 01/24/2018, head CT 01/24/2018, CT angiogram
head 12/03/2017

CLINICAL DATA: Nonruptured cerebral aneurysm. Cerebral aneurysm,
untreated, asymptomatic, follow up.

Creatinine was obtained on site at [HOSPITAL] at [HOSPITAL].
Results: Creatinine 0.7 mg/dL (GFR 98).
EXAM:
CT ANGIOGRAPHY HEAD
TECHNIQUE: Multidetector CT imaging of the head was performed using the
standard protocol during bolus administration of intravenous
contrast. Multiplanar CT image reconstructions and MIPs were
obtained to evaluate the vascular anatomy.
CONTRAST:  75mL XPOZMO-RU8 IOPAMIDOL (XPOZMO-RU8) INJECTION 76%

[Series 3: head w/(date) · axial · 0.45mm/px · z∈[-146,-91]mm · 2 of 34 slices shown]
[im 12/34  brain]
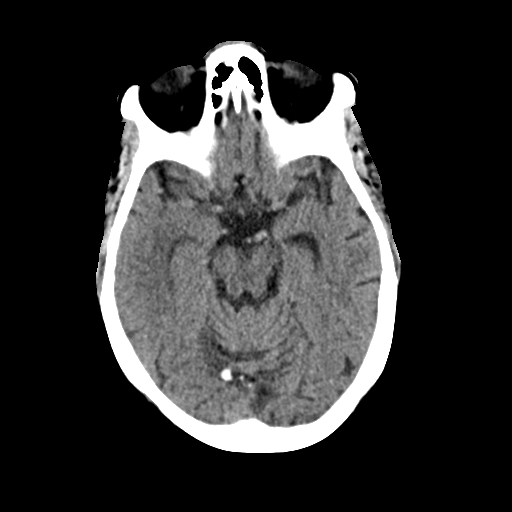
[im 23/34  brain]
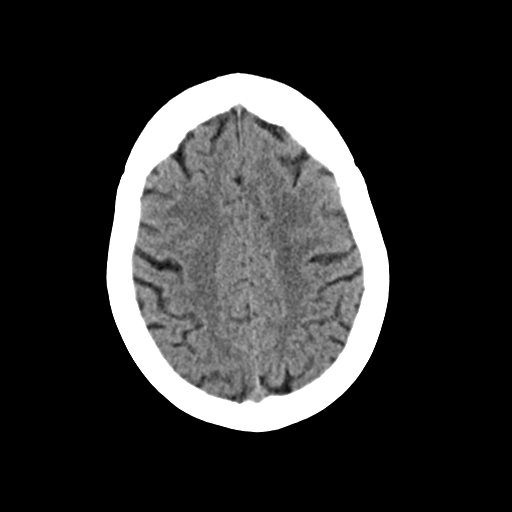

[Series 6: head angio · axial · 0.48mm/px · z∈[-168,-69]mm · 4 of 57 slices shown]
[im 12/57  brain]
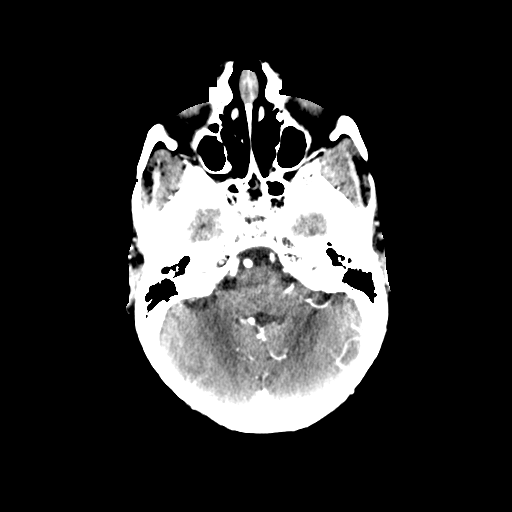
[im 23/57  bone]
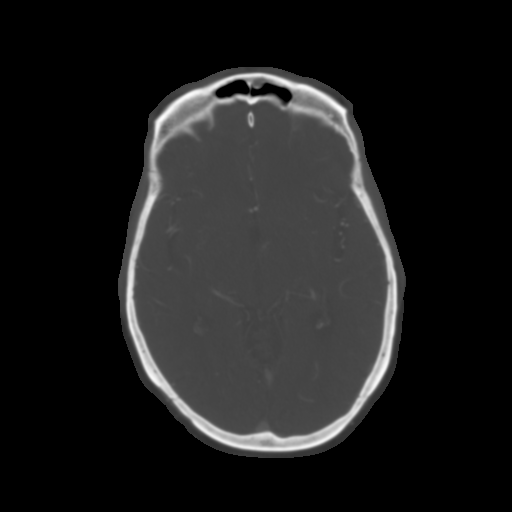
[im 34/57  brain]
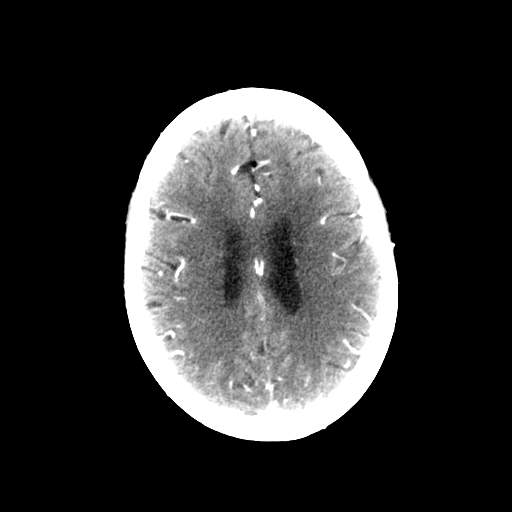
[im 45/57  bone]
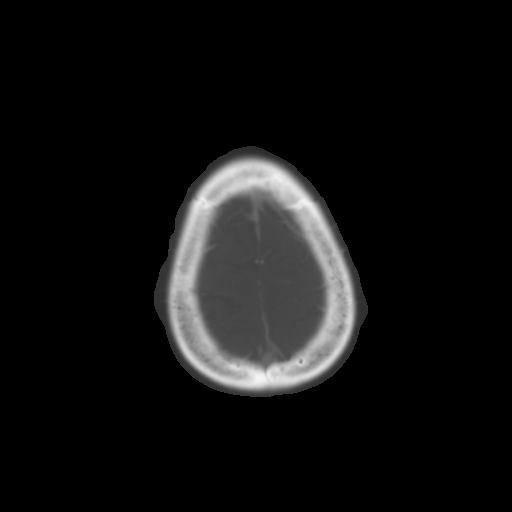

[6 of 30 positions shown; findings below may reference images not displayed]

FINDINGS: CT HEAD

Brain:

No evidence of acute intracranial hemorrhage.

No demarcated cortical infarction.

No evidence of intracranial mass.

No midline shift or extra-axial fluid collection.

Chronic lacunar infarcts within the left basal ganglia and left
thalamus. Background of moderate chronic small vessel ischemic
disease.

Mild generalized parenchymal atrophy.

Vascular: Reported separately.

Skull: Normal. Negative for fracture or focal lesion.

Sinuses: No significant paranasal sinus disease or mastoid effusion.

Orbits: Visualized orbits demonstrate no acute abnormality.

CTA HEAD

Anterior circulation:

As compared to prior CTA head 12/03/2017, there is unchanged size
and appearance of a 7 x 8 mm aneurysm of the cavernous left internal
carotid artery. There is also unchanged size and appearance of a 2-3
mm aneurysm arising from the paraophthalmic right internal carotid
artery.

The intracranial internal carotid arteries are patent bilaterally
with calcified plaque. Unchanged sites of up to moderate stenosis
within the cavernous right internal carotid artery. No more than
mild stenosis within the intracranial left ICA.

The right middle cerebral artery is patent without significant
proximal stenosis. Hypoplastic A1 right anterior cerebral artery.
The left middle cerebral artery is patent without significant
proximal stenosis. Dominant A1 left anterior cerebral artery without
stenosis. The A2 and more distal anterior cerebral arteries are
patent bilateral.

Posterior circulation:

The intracranial vertebral arteries are patent without significant
stenosis, as is the basilar artery. The bilateral posterior cerebral
arteries are patent. Redemonstrated focal moderate/severe stenosis
within the P2 right posterior cerebral artery.

Venous sinuses: Within limitations of contrast timing, no convincing
thrombus.

Anatomic variants: As described
IMPRESSION: CT head:

1. No evidence of acute intracranial abnormality.
2. Chronic lacunar infarcts within the left basal ganglia and left
thalamus.
3. Background of moderate chronic small vessel ischemic disease.

CTA head:

1. As compared to prior CTA 12/03/2017, unchanged size and
appearance of an 8 mm cavernous left internal carotid artery
aneurysm. A 2-3 mm aneurysm arising from the paraophthalmic right
internal carotid artery is also unchanged.
2. Intracranial atherosclerotic disease with unchanged intracranial
stenoses, most notably as follows.
3. Sites of moderate stenosis within the cavernous right ICA.
4. Moderate/severe focal stenosis within the P2 right posterior
cerebral artery.

## 2020-05-08 DIAGNOSIS — M25612 Stiffness of left shoulder, not elsewhere classified: Secondary | ICD-10-CM | POA: Diagnosis not present

## 2020-05-08 DIAGNOSIS — M25512 Pain in left shoulder: Secondary | ICD-10-CM | POA: Diagnosis not present

## 2020-05-10 DIAGNOSIS — M25612 Stiffness of left shoulder, not elsewhere classified: Secondary | ICD-10-CM | POA: Diagnosis not present

## 2020-05-10 DIAGNOSIS — M25512 Pain in left shoulder: Secondary | ICD-10-CM | POA: Diagnosis not present

## 2020-05-13 DIAGNOSIS — M25512 Pain in left shoulder: Secondary | ICD-10-CM | POA: Diagnosis not present

## 2020-05-13 DIAGNOSIS — M25612 Stiffness of left shoulder, not elsewhere classified: Secondary | ICD-10-CM | POA: Diagnosis not present

## 2020-05-14 DIAGNOSIS — M75122 Complete rotator cuff tear or rupture of left shoulder, not specified as traumatic: Secondary | ICD-10-CM | POA: Diagnosis not present

## 2020-05-14 DIAGNOSIS — M542 Cervicalgia: Secondary | ICD-10-CM | POA: Diagnosis not present

## 2020-06-01 DIAGNOSIS — M25512 Pain in left shoulder: Secondary | ICD-10-CM | POA: Diagnosis not present

## 2020-06-05 DIAGNOSIS — M75102 Unspecified rotator cuff tear or rupture of left shoulder, not specified as traumatic: Secondary | ICD-10-CM | POA: Diagnosis not present

## 2020-06-05 DIAGNOSIS — I10 Essential (primary) hypertension: Secondary | ICD-10-CM | POA: Diagnosis not present

## 2020-06-05 DIAGNOSIS — R7303 Prediabetes: Secondary | ICD-10-CM | POA: Diagnosis not present

## 2020-06-11 DIAGNOSIS — F5104 Psychophysiologic insomnia: Secondary | ICD-10-CM | POA: Diagnosis not present

## 2020-06-11 DIAGNOSIS — R7303 Prediabetes: Secondary | ICD-10-CM | POA: Diagnosis not present

## 2020-06-11 DIAGNOSIS — Z52008 Unspecified donor, other blood: Secondary | ICD-10-CM | POA: Diagnosis not present

## 2020-06-11 DIAGNOSIS — I671 Cerebral aneurysm, nonruptured: Secondary | ICD-10-CM | POA: Diagnosis not present

## 2020-06-11 DIAGNOSIS — Z8673 Personal history of transient ischemic attack (TIA), and cerebral infarction without residual deficits: Secondary | ICD-10-CM | POA: Diagnosis not present

## 2020-06-11 DIAGNOSIS — I6523 Occlusion and stenosis of bilateral carotid arteries: Secondary | ICD-10-CM | POA: Diagnosis not present

## 2020-06-11 DIAGNOSIS — D649 Anemia, unspecified: Secondary | ICD-10-CM | POA: Diagnosis not present

## 2020-06-11 DIAGNOSIS — I10 Essential (primary) hypertension: Secondary | ICD-10-CM | POA: Diagnosis not present

## 2020-06-11 DIAGNOSIS — Z0000 Encounter for general adult medical examination without abnormal findings: Secondary | ICD-10-CM | POA: Diagnosis not present

## 2020-08-19 DIAGNOSIS — H43813 Vitreous degeneration, bilateral: Secondary | ICD-10-CM | POA: Diagnosis not present

## 2020-08-19 DIAGNOSIS — H524 Presbyopia: Secondary | ICD-10-CM | POA: Diagnosis not present

## 2020-08-19 DIAGNOSIS — H04123 Dry eye syndrome of bilateral lacrimal glands: Secondary | ICD-10-CM | POA: Diagnosis not present

## 2020-08-19 DIAGNOSIS — H401132 Primary open-angle glaucoma, bilateral, moderate stage: Secondary | ICD-10-CM | POA: Diagnosis not present

## 2020-09-24 ENCOUNTER — Telehealth: Payer: Self-pay | Admitting: Neurology

## 2020-09-24 DIAGNOSIS — M75102 Unspecified rotator cuff tear or rupture of left shoulder, not specified as traumatic: Secondary | ICD-10-CM | POA: Diagnosis not present

## 2020-09-24 DIAGNOSIS — M75122 Complete rotator cuff tear or rupture of left shoulder, not specified as traumatic: Secondary | ICD-10-CM | POA: Diagnosis not present

## 2020-09-24 DIAGNOSIS — M542 Cervicalgia: Secondary | ICD-10-CM | POA: Diagnosis not present

## 2020-09-24 DIAGNOSIS — M19012 Primary osteoarthritis, left shoulder: Secondary | ICD-10-CM | POA: Diagnosis not present

## 2020-09-24 DIAGNOSIS — M13862 Other specified arthritis, left knee: Secondary | ICD-10-CM | POA: Diagnosis not present

## 2020-09-24 DIAGNOSIS — M5032 Other cervical disc degeneration, mid-cervical region, unspecified level: Secondary | ICD-10-CM | POA: Diagnosis not present

## 2020-09-24 NOTE — Telephone Encounter (Signed)
Returned patient's call regarding when she can stop taking Plavix prior to her shoulder.  Discussed with patient, Dr. Clydene Fake recommendation of 5 days. Her last stroke/TIA was December 2019.  It is up to her surgeon's discretion when she will restart the blood thinner.  Also advised her to discuss stopping the blood thinner with the surgeon, as he should send a surgical clearance form for Dr. Leonie Man to sign.  Patient denied further questions, verbalized understanding and expressed appreciation for the phone call.

## 2020-09-24 NOTE — Telephone Encounter (Signed)
Pt called stating she is supposed to have surgery soon does not know the date of surgery just yet, but she is wanting to know prior to surgery how many days should she stop taking the clopidogrel (PLAVIX) 75 MG tablet.

## 2020-10-24 ENCOUNTER — Other Ambulatory Visit (HOSPITAL_BASED_OUTPATIENT_CLINIC_OR_DEPARTMENT_OTHER): Payer: Self-pay

## 2020-10-24 ENCOUNTER — Ambulatory Visit: Payer: Medicare HMO | Attending: Internal Medicine

## 2020-10-24 DIAGNOSIS — Z23 Encounter for immunization: Secondary | ICD-10-CM

## 2020-10-24 MED ORDER — PFIZER COVID-19 VAC BIVALENT 30 MCG/0.3ML IM SUSP
INTRAMUSCULAR | 0 refills | Status: DC
  Filled 2020-10-24: qty 0.3, 1d supply, fill #0

## 2020-10-24 NOTE — Progress Notes (Signed)
   Covid-19 Vaccination Clinic  Name:  Momo Braun    MRN: [id number] DOB: [birth year]  10/24/2020  Ms. [place] was observed post Covid-19 immunization for 15 minutes without incident. She was provided with Vaccine Information Sheet and instruction to access the V-Safe system.   Ms. Folmar was instructed to call [phone] with any severe reactions post vaccine: Difficulty breathing  Swelling of face and throat  A fast heartbeat  A bad rash all over body  Dizziness and weakness

## 2020-11-18 DIAGNOSIS — M542 Cervicalgia: Secondary | ICD-10-CM | POA: Diagnosis not present

## 2020-11-20 DIAGNOSIS — M5032 Other cervical disc degeneration, mid-cervical region, unspecified level: Secondary | ICD-10-CM | POA: Diagnosis not present

## 2020-11-20 DIAGNOSIS — M542 Cervicalgia: Secondary | ICD-10-CM | POA: Diagnosis not present

## 2020-11-25 DIAGNOSIS — I10 Essential (primary) hypertension: Secondary | ICD-10-CM | POA: Diagnosis not present

## 2020-11-25 DIAGNOSIS — F5104 Psychophysiologic insomnia: Secondary | ICD-10-CM | POA: Diagnosis not present

## 2020-11-25 DIAGNOSIS — I6523 Occlusion and stenosis of bilateral carotid arteries: Secondary | ICD-10-CM | POA: Diagnosis not present

## 2020-11-25 DIAGNOSIS — E7800 Pure hypercholesterolemia, unspecified: Secondary | ICD-10-CM | POA: Diagnosis not present

## 2020-11-27 DIAGNOSIS — I2510 Atherosclerotic heart disease of native coronary artery without angina pectoris: Secondary | ICD-10-CM | POA: Diagnosis not present

## 2020-12-10 DIAGNOSIS — M542 Cervicalgia: Secondary | ICD-10-CM | POA: Diagnosis not present

## 2020-12-12 DIAGNOSIS — R194 Change in bowel habit: Secondary | ICD-10-CM | POA: Diagnosis not present

## 2020-12-12 DIAGNOSIS — Z86010 Personal history of colonic polyps: Secondary | ICD-10-CM | POA: Diagnosis not present

## 2020-12-12 DIAGNOSIS — Z1211 Encounter for screening for malignant neoplasm of colon: Secondary | ICD-10-CM | POA: Diagnosis not present

## 2020-12-17 DIAGNOSIS — M542 Cervicalgia: Secondary | ICD-10-CM | POA: Diagnosis not present

## 2020-12-24 DIAGNOSIS — M542 Cervicalgia: Secondary | ICD-10-CM | POA: Diagnosis not present

## 2020-12-31 DIAGNOSIS — M542 Cervicalgia: Secondary | ICD-10-CM | POA: Diagnosis not present

## 2021-01-01 ENCOUNTER — Telehealth: Payer: Self-pay | Admitting: Neurology

## 2021-01-01 NOTE — Telephone Encounter (Signed)
Pt has an upcoming colonoscopy on 12-21, she is to stop taking JUV:QQUIVHOYWVX (PLAVIX) 75 MG tablet 5-7 days before.  Pt has concerns about this that she would like to discuss with Dr Leonie Man

## 2021-01-02 NOTE — Telephone Encounter (Signed)
Left message for a return call

## 2021-01-02 NOTE — Telephone Encounter (Signed)
Attempted to reach the patient again. Left detailed message (ok per DPR). I let her know that she may have her gastroenterologist fax over the clearance for her colonoscopy. Dr. Leonie Man will review for appropriateness. If she has other questions, I provided our number to call back (along with our office hours).

## 2021-01-07 NOTE — Telephone Encounter (Signed)
Pt called, would like a call from Dr. Leonie Man to discuss stopping Clopidogrel.

## 2021-01-07 NOTE — Telephone Encounter (Signed)
I called patient. She has concerns about stopping plavix prior to her colonoscopy on 01/15/21. I advised her that the GI office usually sends over a clearance form for [place]. I am unsure if we have received this. Patient insists that she speak with Dr. Leonie Man regarding her plavix.  It also appears that we have already discussed stopping plavix 5 days prior to the procedure with the patient as documented in August of 2022. Patient says that she remembers this call but still would like to speak with Dr. Leonie Man only.  She can be reached at [phone].

## 2021-01-15 DIAGNOSIS — Z86010 Personal history of colonic polyps: Secondary | ICD-10-CM | POA: Diagnosis not present

## 2021-01-15 DIAGNOSIS — R194 Change in bowel habit: Secondary | ICD-10-CM | POA: Diagnosis not present

## 2021-01-15 DIAGNOSIS — Z1211 Encounter for screening for malignant neoplasm of colon: Secondary | ICD-10-CM | POA: Diagnosis not present

## 2021-02-13 ENCOUNTER — Other Ambulatory Visit: Payer: Self-pay

## 2021-02-13 ENCOUNTER — Ambulatory Visit (INDEPENDENT_AMBULATORY_CARE_PROVIDER_SITE_OTHER): Payer: Medicare HMO | Admitting: Sports Medicine

## 2021-02-13 NOTE — Progress Notes (Signed)
Patient left before she could be seen by provider.  Patient states that she needed to go pick up her grandson.

## 2021-02-28 DIAGNOSIS — Z1231 Encounter for screening mammogram for malignant neoplasm of breast: Secondary | ICD-10-CM | POA: Diagnosis not present

## 2021-03-04 ENCOUNTER — Ambulatory Visit: Payer: Medicare HMO | Admitting: Neurology

## 2021-03-04 ENCOUNTER — Encounter: Payer: Self-pay | Admitting: Neurology

## 2021-03-04 VITALS — BP 152/77 | HR 68 | Ht 66.0 in | Wt 173.2 lb

## 2021-03-04 DIAGNOSIS — Z8673 Personal history of transient ischemic attack (TIA), and cerebral infarction without residual deficits: Secondary | ICD-10-CM

## 2021-03-04 DIAGNOSIS — I671 Cerebral aneurysm, nonruptured: Secondary | ICD-10-CM

## 2021-03-04 NOTE — Progress Notes (Signed)
STROKE NEUROLOGY FOLLOW UP NOTE  NAME: [name] DOB: January 05, 1946  REASON FOR VISIT: stroke follow up HISTORY FROM: pt and chart  Today we had the pleasure of seeing [name] in follow-up at our [facility]rology [facility]. Pt was accompanied by no one.   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   She stated that she had stroke in 04/2005 but no residue and followed with Dr. Pearlean Brownie in clinic, last seen 03/2015. For the last 6 months to a year, she started to have right sided neck pain, stiffness, with right shoulder and lateral arm pain with mildly limited ROM, with radiating pain to the forearm and right palm. Comes and goes, sometimes can be intense, but other time dull achy. It can happen every the other day. She also has LBP comes and goes, sometimes hard to get out of the car due to LBP.  However, last Monday, she had right side facial numbness, at the left cheek area, feels like norvacaine shot at dental office. The numbness lasted about one day and resolved. She denies any HA, weakness, speech or swallow difficulty, no arm/leg issues at that time. She is compliant with aggrenox and lipitor.  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 Patient has previously met with Dr. Corliss Skains three times to discuss endovascular treatment and chosen conservative monitoring and follow up.  She had HTN and stable taking meds, today BP 129/66. She denies DM and she check her glucose at home was 115-130.    Update 11/21/2015 : She returns for follow-up after last visit with Dr. showed 2 months  ago. She states she's not had any further episodes of numbness on cheek. She feels muscle tightness in the back of her head and neck but denies significant radicular pain. She did have an episode of her right hand fingers locking up after and morning when she was taking taken pending her wrist a lot in the kitchen. She did not undergo MRI scan the brain and cervical spine has been never got a call to schedule that but have now been scheduled for 12/02/15. Similarly CT angiogram of the brain for follow-up of aneurysms is also scheduled for the same day. Patient was seen by gastroenterologist Dr. Loreta Ave today for rectal bleeding and is scheduled to undergo colonoscopy in December. She is concerned about risk of stroke and stopping Aggrenox for 5 days prior to the procedure.  Update 05/05/2016 : She returns for follow-up after last visit 6 months ago. She continues to do well without recurrent stroke or TIA symptoms. She had follow-up CT angiogram of the brain and neck done on 12/02/15 which showed stable appearance of her intracranial aneurysms. Patient states his started walking regularly and has lost 20 pounds. She occasionally hears her heartbeat in the ears particularly at night when she is lying down quietly. She denies significant stress. She is also noticed some intermittent numbness in the lower lateral aspect of the left foot. She does agree that she sleeps on her left side a lot. She denies significant back pain or radicular  pain. She has not had any recurrent stroke or TIA symptoms. She states her blood pressure is well controlled today it is 120/63. Update 11/29/2017 : She returns for follow-up after last visit a year ago.  She continues to do well without definite stroke or TIA symptoms now for greater than 10 years.  She was however seen in the emergency room on 06/29/2017 and she woke up with tingling in the left hand as well as left foot.  She was seen by neurologist Dr. Cheral Marker who felt her symptoms are  not comparable with stroke.  She did have a prior history of carpal tunnel and hand numbness seems similar to this.  I had previously seen her for left foot numbness which I thought was related to peroneal entrapment neuropathy.  Patient had a CT scan of the head done which showed no acute abnormality.  Patient states the symptoms have not recurred since then.  She remains on Aggrenox which is tolerating well without side effects.  Her blood pressure fluctuates a bit at times it is in the low 110 range but today it is 140/78.  She takes metoprolol 25 mg daily.  She states her lipid profile is well controlled and she is on Lipitor 20 and Zetia.  She does follow-up regularly with a primary physician.  She had CT angiogram of the brain done on 12/28/2016 which showed stable appearance of her intracranial aneurysms.  She is due for a follow-up CTA next month. Update 03/22/2018 : She returns for follow-up after last visit 3 months ago.  She was hospitalized for a small thalamic lacunar infarct in December 2019.  She complained of some numbness and incoordination of her hand as well as felt jittery and tremulous.  The symptoms are similar to her previous stroke in 2007 and she went to the hospital.  MRI scan of the brain was obtained which showed a tiny acute lacunar infarct in the left thalamus.  Carotid ultrasound was unremarkable.  She had recently had CT angiogram of the brain in November 2019 for follow-up of her asymptomatic left cavernous and pericallosal aneurysm since it was not repeated.  Echocardiogram showed normal ejection fraction.  LDL cholesterol was 95 mg percent.  Hemoglobin A1c was 5.7.  Patient had been on Aggrenox following her previous stroke since 2007.  This was changed to aspirin and Plavix.  She was advised to stop aspirin after 3 weeks was Ms. misunderstood this and is currently on both.  She is tolerating them well without bruising or bleeding.  She states her numbness and clumsiness has  resolved completely.  She has no focal deficits.  She is on Lipitor now 40 mg daily.  She has not had any follow-up lipid profile checked.  She has been keeping track of her blood pressure and is worried that her morning systolic ranges in the 656C range.  She has an upcoming visit with her primary care physician Dr. Concha Pyo and plans to discuss this.  She is also noticed some lumps beneath her right jaw which are probably enlarged lymph nodes.  She denies any significant tooth pain, recurrent sinus infections. Update 12/13/2018 : She returns for follow-up after last visit 9 months ago.  She states she is doing well she has had no further recurrent stroke or TIA symptoms.  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  She still has some intermittent anxiety and tremulousness and she gets nervous.  Her blood pressure tends to fluctuate but when  she is relaxed it is normal.  Today it is slightly elevated in the office at 152/86.  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  She states she was had a chronic reflux symptoms over the last few months and was tested for Covid multiple times.  She was positive initially then next time indeterminate and then positive again.  She isolated herself a few weeks.  Subsequently she is been tested twice and has stayed negative.  Interestingly she had multiple family members were also tested positive in the time but nobody had any symptoms of Covid infection and were not sick requiring any hospitalization.  She remains on Plavix which is tolerating well without bruising or bleeding.  She has no new complaints.  Her last CT angiogram of the brain to follow-up on aneurysms was a year ago. Update 03/04/2020 ; she returns for follow-up after last visit in November 2020.  She continues to do well and has not had recurrent TIA or stroke symptoms.  She remains on Plavix which is tolerating well without bruising or bleeding.  She remains on  Lipitor which is tolerating well as well without muscle aches and pains.  She had lipid profile checked 3 months ago her primary physician and her satisfactory.  Her blood pressure is well controlled at home usually but today it is elevated in office slightly at 160/72.  She had follow-up CT angiogram done on 01/04/2020 which I personally reviewed shows stable appearance of the 8 mm left cavernous carotid and 3 to 4 mm right cavernous carotid aneurysms.  She has no new complaints today 03/04/2021: She returns for follow-up after last visit a year ago.  She continues to do well and has not had recurrent TIA or stroke symptoms now for several years.  She is pretty active and exercises regularly.  She had colonoscopy done last year which showed no significant polyps.  She remains on Clopidogrel which is tolerating well without bleeding or bruising.  Blood pressure is normally well controlled at home though today it is elevated in the office and she admits she had missed taking her morning dose of blood pressure medicine.  She states her sugars are under very good control.  She remains on Lipitor which is tolerating well without muscle aches and pains.  She does see primary physician regularly and will have follow-up lipid profile and A1c checked.  She has no complaints today. REVIEW OF SYSTEMS: Full 14 system review of systems performed and notable only for those listed below and in HPI above, all others are negative:       joint pain, muscle cramps, insomnia, not enough sleep all othersystems negative The following represents the patient's updated allergies and side effects list: Allergies  Allergen Reactions   Flagyl [Metronidazole] Itching and Swelling    The neurologically relevant items on the patient's problem list were reviewed on today's visit.  Neurologic Examination  A problem focused neurological exam (12 or more points of the single system neurologic examination, vital signs counts as 1 point,  cranial nerves count for 8 points) was performed.  Blood pressure (!) 152/77, pulse 68, height $RemoveBe'5\' 6"'fSlzBJDxd$  (1.676 m), weight 173 lb 3.2 oz (78.6 kg).  General - Well nourished, well developed elderly African-American lady, in no apparent distress.  Ophthalmologic - funduscopic exam not done   Cardiovascular - Regular rate and rhythm with no murmur.  Mental Status -  Level of arousal and orientation to time,  place, and person were intact. Language including expression, naming, repetition, comprehension was assessed and found intact. Fund of Knowledge was assessed and was intact.  Cranial Nerves II - XII - II - Visual field intact OU. III, IV, VI - Extraocular movements intact. V - Facial sensation intact bilaterally. VII - Facial movement intact bilaterally. VIII - Hearing & vestibular intact bilaterally. X - Palate elevates symmetrically. XI - Chin turning & shoulder shrug intact bilaterally. XII - Tongue protrusion intact.  Motor Strength - The patients strength was normal in all extremities and pronator drift was absent.  Bulk was normal and fasciculations were absent.   Motor Tone - Muscle tone was assessed at the neck and appendages and was normal.  Reflexes - The patients reflexes were 1+ in all extremities and she had no pathological reflexes.  Sensory - Light touch, temperature/pinprick were assessed and were normal.    Coordination - The patient had normal movements in the hands and feet with no ataxia or dysmetria.  Tremor was absent.  Gait and Station - The patient's transfers, posture, gait, station, and turns were observed as normal.  Mild difficulty in tandem walking  Data reviewed: I personally reviewed the images and agree with the radiology interpretations.  CT angio brain and neck 12/02/2015 :  1. 8 mm left cavernous ICA aneurysm is unchanged compared to 08/18/2014. 2. 2-3 mm right ophthalmic ICA aneurysm is unchanged from prior. 3. 1-2 mm outpouching from the  supraclinoid left ICA is stable and favors infundibulum. CT angio head and neck 01/03/2020:  1. Unchanged 8 mm left cavernous and 3 mm right paraophthalmic ICA Aneurysms. 2. 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 Old LEFT centrum semiovale lacunar infarct. No acute findings. Assessment:   she is a 76 y.o. African American female with PMH of HTN, hyperlipidemia brain aneurysm and left BG infarct 04/2005  And asymptomatic cerebral aneurysms which have remained stable since follow-up since 2007.  Last CT angio being in December 2021.  Transient episode of left hand and feet paresthesias of unclear etiology in June 2019.  History of mild carpal tunnel syndrome and peroneal neuropathy which appears stable.  H/o right thalamic lacunar infarct in December 2019 from which she has made a full recovery. Plan:  I had a long discussion with the patient with regards to her remote work as well as asymptomatic bilateral cavernous carotid aneurysms and she appears to be quite stable from neurovascular standpoint.  I recommend she continue Plavix for stroke prevention and maintain aggressive risk factor modification with strict control of hypertension and blood pressure goal below 140/90, lipids with LDL cholesterol goal below 70 mg percent and diabetes with hemoglobin A1c goal below 6.5%.  Continue conservative follow-up for her asymptomatic cerebral aneurysms and will repeat CT angiogram prior to next follow-up visit in a year.  She will call earlier if necessary..Greater than 50% time during this 35 minute visit was spent on counseling and coordination of care about her thalamic lacunar infarct, asymptomatic cerebral aneurysms, stroke and TIA risk, left foot peroneal neuropathy and answering questions  . Antony Contras, MD  [facility] [address] [place], [place] [address]  Phone [phone] Fax  [phone]  Antony Contras, MD [facility] [address] [place], [place] [address] [phone]

## 2021-03-04 NOTE — Patient Instructions (Signed)
I had a long discussion with the patient with regards to her remote work as well as asymptomatic bilateral cavernous carotid aneurysms and she appears to be quite stable from neurovascular standpoint.  I recommend she continue Plavix for stroke prevention and maintain aggressive risk factor modification with strict control of hypertension and blood pressure goal below 140/90, lipids with LDL cholesterol goal below 70 mg percent and diabetes with hemoglobin A1c goal below 6.5%.  Continue conservative follow-up for her asymptomatic cerebral aneurysms and will repeat CT angiogram prior to next follow-up visit in a year.  She will call earlier if necessary.

## 2021-03-06 ENCOUNTER — Other Ambulatory Visit: Payer: Self-pay

## 2021-03-06 ENCOUNTER — Encounter: Payer: Self-pay | Admitting: Sports Medicine

## 2021-03-06 ENCOUNTER — Ambulatory Visit: Payer: Medicare HMO | Admitting: Sports Medicine

## 2021-03-06 DIAGNOSIS — M21619 Bunion of unspecified foot: Secondary | ICD-10-CM

## 2021-03-06 DIAGNOSIS — M79674 Pain in right toe(s): Secondary | ICD-10-CM

## 2021-03-06 DIAGNOSIS — M7742 Metatarsalgia, left foot: Secondary | ICD-10-CM

## 2021-03-06 DIAGNOSIS — Z7901 Long term (current) use of anticoagulants: Secondary | ICD-10-CM

## 2021-03-06 DIAGNOSIS — M79675 Pain in left toe(s): Secondary | ICD-10-CM | POA: Diagnosis not present

## 2021-03-06 DIAGNOSIS — B351 Tinea unguium: Secondary | ICD-10-CM | POA: Diagnosis not present

## 2021-03-06 NOTE — Progress Notes (Signed)
Subjective: [name] is a 76 y.o. female patient with history of PVD on Plavix still for TIA history who returns to office today complaining of 1.  Long,mildly painful toenails difficult to trim with pain at both big toenails states that her nails tend to grow in states that previous trimming seems to help for a while.2.  Pain at bunion and is wondering if she should have surgery to have the bump shaved down 3.  Pain when walking especially after exercise at the ball of the foot.  No other pedal complaints noted.  Patient Active Problem List   Diagnosis Date Noted   Osteoarthritis of knee 06/01/2019   Atrophic vaginitis 09/13/2018   Uterine leiomyoma 09/13/2018   Gastroesophageal reflux disease without esophagitis 07/25/2018   Globus pharyngeus 07/25/2018   Submandibular swelling 07/25/2018   Essential hypertension 01/25/2018   HLD (hyperlipidemia) 01/25/2018   Anxiety 01/25/2018   CVA (cerebral vascular accident) ([place]) 01/24/2018   Disorder of shoulder 03/18/2017   Pain in joint of right shoulder 03/18/2017   TIA (transient ischemic attack) 09/20/2015   DDD (degenerative disc disease), cervical 09/20/2015   History of stroke 09/20/2015   Aneurysm, cerebral, nonruptured 03/28/2015   Bilateral leg pain 10/26/2011   Edema of lower extremity 10/26/2011   Pelvic pain in female 08/25/2011   Yeast infection    Trichomonas    Cystic breast    Stroke ([place])    History of chicken pox    History of measles    History of mumps    Current Outpatient Medications on File Prior to Visit  Medication Sig Dispense Refill   acetaminophen (TYLENOL) 500 MG tablet Take 500 mg by mouth every 6 (six) hours as needed for mild pain or moderate pain.      ALPRAZolam (XANAX) 1 MG tablet Take 1 mg by mouth 2 (two) times daily as needed for anxiety.      atorvastatin (LIPITOR) 40 MG tablet Take 1 tablet (40 mg total) by mouth daily at 6 PM. 30 tablet 0   betamethasone dipropionate 0.05 % cream as  needed.     Blood Glucose Monitoring Suppl (TRUE METRIX METER) w/Device KIT as needed.     brimonidine (ALPHAGAN) 0.15 % ophthalmic solution Place 1 drop into both eyes 2 (two) times daily.      clopidogrel (PLAVIX) 75 MG tablet TAKE 1 TABLET (75 MG TOTAL) BY MOUTH DAILY. 90 tablet 3   ezetimibe (ZETIA) 10 MG tablet Take 10 mg by mouth at bedtime.      latanoprost (XALATAN) 0.005 % ophthalmic solution Place 1 drop into both eyes at bedtime.     metoprolol succinate (TOPROL-XL) 50 MG 24 hr tablet at bedtime.     senna-docusate (SENOKOT-S) 8.6-50 MG tablet Take 1 tablet by mouth at bedtime as needed for mild constipation. 30 tablet 0   TRUE METRIX BLOOD GLUCOSE TEST test strip as needed.     valsartan-hydrochlorothiazide (DIOVAN-HCT) 160-12.5 MG tablet daily.     No current facility-administered medications on file prior to visit.   Allergies  Allergen Reactions   Flagyl [Metronidazole] Itching and Swelling    No results found for this or any previous visit (from the past 2160 hour(s)).   Objective: General: Patient is awake, alert, and oriented x 3 and in no acute distress.  Integument: Skin is warm, dry and supple bilateral. Right>Left hallux and lesser toenails are tender, long, thickened and dystrophic with subungual debris, consistent with onychomycosis, No open lesions or preulcerative lesions  present bilateral. Remaining integument unremarkable.  Vasculature:  Dorsalis Pedis pulse 2/4 bilateral. Posterior Tibial pulse  1/4 bilateral. Capillary fill time <3 sec 1-5 bilateral. Positive hair growth to the level of the digits.Temperature gradient within normal limits. No varicosities present bilateral.   Neurology: Gross sensation present via light touch bilateral.  Musculoskeletal: Mild pain to palpation to toenails right greater than left first toe.  There is mild pain to palpation to bunion deformity left greater than right and subjective pain to the ball of the foot with excessive  walking or weightbearing, clinically there is fat pad atrophy noted left greater than right which is normal of aging and due to foot type.  Assessment and Plan: Problem List Items Addressed This Visit   None Visit Diagnoses     Pain due to onychomycosis of toenails of both feet    -  Primary   Bunion       Metatarsalgia of left foot       Long term (current) use of anticoagulants          -Examined patient. -Mechanically debrided toenails x 10 using sterile nail nipper and filed with dremel without incident  -Advised patient if ingrown toenails continue to be bothersome in the future may benefit from an in office nail procedure -Discussed with patient treatment options for bunion and advised her a silver bunionectomy may not give her long-term results and could have recurrence however since patient has not tried any conservative treatment recommend at this time a bunion shoe as I have dispensed for patient to use when in shoes to prevent rubbing against the bump advised patient if pain continues in the future could possibly talk about surgery -Recommend topical pain cream or rub as needed for pain to the bunion and encouraged use of shoes that offer support -Advised patient for pain to the ball the foot to replace her walking shoes with new ones -Patient advised to call the office if any problems or questions arise in the meantime. -Return if symptoms fail to continue to improve after a month or sooner if problems or issues arise.  Landis Martins, DPM

## 2021-03-17 DIAGNOSIS — Z01419 Encounter for gynecological examination (general) (routine) without abnormal findings: Secondary | ICD-10-CM | POA: Diagnosis not present

## 2021-03-19 DIAGNOSIS — I2510 Atherosclerotic heart disease of native coronary artery without angina pectoris: Secondary | ICD-10-CM | POA: Diagnosis not present

## 2021-03-19 DIAGNOSIS — I10 Essential (primary) hypertension: Secondary | ICD-10-CM | POA: Diagnosis not present

## 2021-03-19 DIAGNOSIS — I639 Cerebral infarction, unspecified: Secondary | ICD-10-CM | POA: Diagnosis not present

## 2021-03-19 DIAGNOSIS — E785 Hyperlipidemia, unspecified: Secondary | ICD-10-CM | POA: Diagnosis not present

## 2021-06-04 ENCOUNTER — Other Ambulatory Visit: Payer: Self-pay | Admitting: Neurology

## 2021-06-04 DIAGNOSIS — H401132 Primary open-angle glaucoma, bilateral, moderate stage: Secondary | ICD-10-CM | POA: Diagnosis not present

## 2021-06-04 DIAGNOSIS — H04123 Dry eye syndrome of bilateral lacrimal glands: Secondary | ICD-10-CM | POA: Diagnosis not present

## 2021-06-04 NOTE — Telephone Encounter (Signed)
Rx refilled.

## 2021-06-10 DIAGNOSIS — M8589 Other specified disorders of bone density and structure, multiple sites: Secondary | ICD-10-CM | POA: Diagnosis not present

## 2021-06-10 DIAGNOSIS — E7800 Pure hypercholesterolemia, unspecified: Secondary | ICD-10-CM | POA: Diagnosis not present

## 2021-06-10 DIAGNOSIS — I10 Essential (primary) hypertension: Secondary | ICD-10-CM | POA: Diagnosis not present

## 2021-06-10 DIAGNOSIS — R7303 Prediabetes: Secondary | ICD-10-CM | POA: Diagnosis not present

## 2021-06-11 DIAGNOSIS — M25571 Pain in right ankle and joints of right foot: Secondary | ICD-10-CM | POA: Diagnosis not present

## 2021-06-24 DIAGNOSIS — M25571 Pain in right ankle and joints of right foot: Secondary | ICD-10-CM | POA: Diagnosis not present

## 2021-06-24 DIAGNOSIS — S8264XA Nondisplaced fracture of lateral malleolus of right fibula, initial encounter for closed fracture: Secondary | ICD-10-CM | POA: Diagnosis not present

## 2021-06-25 DIAGNOSIS — I10 Essential (primary) hypertension: Secondary | ICD-10-CM | POA: Diagnosis not present

## 2021-06-25 DIAGNOSIS — F5104 Psychophysiologic insomnia: Secondary | ICD-10-CM | POA: Diagnosis not present

## 2021-06-25 DIAGNOSIS — I6523 Occlusion and stenosis of bilateral carotid arteries: Secondary | ICD-10-CM | POA: Diagnosis not present

## 2021-06-25 DIAGNOSIS — E7800 Pure hypercholesterolemia, unspecified: Secondary | ICD-10-CM | POA: Diagnosis not present

## 2021-07-01 DIAGNOSIS — S8264XA Nondisplaced fracture of lateral malleolus of right fibula, initial encounter for closed fracture: Secondary | ICD-10-CM | POA: Diagnosis not present

## 2021-07-01 DIAGNOSIS — M25571 Pain in right ankle and joints of right foot: Secondary | ICD-10-CM | POA: Diagnosis not present

## 2021-07-15 DIAGNOSIS — S8264XA Nondisplaced fracture of lateral malleolus of right fibula, initial encounter for closed fracture: Secondary | ICD-10-CM | POA: Diagnosis not present

## 2021-08-05 DIAGNOSIS — S8264XA Nondisplaced fracture of lateral malleolus of right fibula, initial encounter for closed fracture: Secondary | ICD-10-CM | POA: Diagnosis not present

## 2021-08-27 DIAGNOSIS — R7303 Prediabetes: Secondary | ICD-10-CM | POA: Diagnosis not present

## 2021-08-27 DIAGNOSIS — I10 Essential (primary) hypertension: Secondary | ICD-10-CM | POA: Diagnosis not present

## 2021-08-27 DIAGNOSIS — E785 Hyperlipidemia, unspecified: Secondary | ICD-10-CM | POA: Diagnosis not present

## 2021-08-27 DIAGNOSIS — I2510 Atherosclerotic heart disease of native coronary artery without angina pectoris: Secondary | ICD-10-CM | POA: Diagnosis not present

## 2021-09-03 ENCOUNTER — Ambulatory Visit (INDEPENDENT_AMBULATORY_CARE_PROVIDER_SITE_OTHER): Payer: Self-pay | Admitting: Podiatry

## 2021-09-03 DIAGNOSIS — Z91199 Patient's noncompliance with other medical treatment and regimen due to unspecified reason: Secondary | ICD-10-CM

## 2021-09-03 NOTE — Progress Notes (Signed)
1. No-show for appointment     

## 2021-09-04 ENCOUNTER — Ambulatory Visit: Payer: Medicare HMO | Admitting: Sports Medicine

## 2021-09-09 DIAGNOSIS — S8264XA Nondisplaced fracture of lateral malleolus of right fibula, initial encounter for closed fracture: Secondary | ICD-10-CM | POA: Diagnosis not present

## 2021-09-11 DIAGNOSIS — I10 Essential (primary) hypertension: Secondary | ICD-10-CM | POA: Diagnosis not present

## 2021-09-11 DIAGNOSIS — D649 Anemia, unspecified: Secondary | ICD-10-CM | POA: Diagnosis not present

## 2021-09-11 DIAGNOSIS — E559 Vitamin D deficiency, unspecified: Secondary | ICD-10-CM | POA: Diagnosis not present

## 2021-09-15 DIAGNOSIS — D72819 Decreased white blood cell count, unspecified: Secondary | ICD-10-CM | POA: Diagnosis not present

## 2021-09-15 DIAGNOSIS — Z8673 Personal history of transient ischemic attack (TIA), and cerebral infarction without residual deficits: Secondary | ICD-10-CM | POA: Diagnosis not present

## 2021-09-15 DIAGNOSIS — E118 Type 2 diabetes mellitus with unspecified complications: Secondary | ICD-10-CM | POA: Diagnosis not present

## 2021-09-15 DIAGNOSIS — I6523 Occlusion and stenosis of bilateral carotid arteries: Secondary | ICD-10-CM | POA: Diagnosis not present

## 2021-09-15 DIAGNOSIS — E559 Vitamin D deficiency, unspecified: Secondary | ICD-10-CM | POA: Diagnosis not present

## 2021-09-15 DIAGNOSIS — I722 Aneurysm of renal artery: Secondary | ICD-10-CM | POA: Diagnosis not present

## 2021-09-15 DIAGNOSIS — Z0000 Encounter for general adult medical examination without abnormal findings: Secondary | ICD-10-CM | POA: Diagnosis not present

## 2021-09-15 DIAGNOSIS — I10 Essential (primary) hypertension: Secondary | ICD-10-CM | POA: Diagnosis not present

## 2021-09-15 DIAGNOSIS — I671 Cerebral aneurysm, nonruptured: Secondary | ICD-10-CM | POA: Diagnosis not present

## 2021-10-07 DIAGNOSIS — H401132 Primary open-angle glaucoma, bilateral, moderate stage: Secondary | ICD-10-CM | POA: Diagnosis not present

## 2021-10-07 DIAGNOSIS — H524 Presbyopia: Secondary | ICD-10-CM | POA: Diagnosis not present

## 2021-10-07 DIAGNOSIS — H35033 Hypertensive retinopathy, bilateral: Secondary | ICD-10-CM | POA: Diagnosis not present

## 2021-10-07 DIAGNOSIS — H52203 Unspecified astigmatism, bilateral: Secondary | ICD-10-CM | POA: Diagnosis not present

## 2021-10-07 DIAGNOSIS — H43813 Vitreous degeneration, bilateral: Secondary | ICD-10-CM | POA: Diagnosis not present

## 2021-10-07 DIAGNOSIS — H04123 Dry eye syndrome of bilateral lacrimal glands: Secondary | ICD-10-CM | POA: Diagnosis not present

## 2021-10-21 DIAGNOSIS — E1169 Type 2 diabetes mellitus with other specified complication: Secondary | ICD-10-CM | POA: Diagnosis not present

## 2021-10-21 DIAGNOSIS — E7800 Pure hypercholesterolemia, unspecified: Secondary | ICD-10-CM | POA: Diagnosis not present

## 2021-10-21 DIAGNOSIS — Z8673 Personal history of transient ischemic attack (TIA), and cerebral infarction without residual deficits: Secondary | ICD-10-CM | POA: Diagnosis not present

## 2021-10-21 DIAGNOSIS — I10 Essential (primary) hypertension: Secondary | ICD-10-CM | POA: Diagnosis not present

## 2021-10-22 DIAGNOSIS — N644 Mastodynia: Secondary | ICD-10-CM | POA: Diagnosis not present

## 2021-10-30 DIAGNOSIS — N644 Mastodynia: Secondary | ICD-10-CM | POA: Diagnosis not present

## 2021-10-30 DIAGNOSIS — R922 Inconclusive mammogram: Secondary | ICD-10-CM | POA: Diagnosis not present

## 2021-11-11 DIAGNOSIS — S8264XA Nondisplaced fracture of lateral malleolus of right fibula, initial encounter for closed fracture: Secondary | ICD-10-CM | POA: Diagnosis not present

## 2021-11-12 DIAGNOSIS — N644 Mastodynia: Secondary | ICD-10-CM | POA: Diagnosis not present

## 2021-11-24 ENCOUNTER — Telehealth: Payer: Self-pay | Admitting: Neurology

## 2021-11-24 NOTE — Telephone Encounter (Signed)
Pt states there is an opportunity to take a trip which requires her a 2 hour flight.  Pt does not recall what Dr Leonie Man said about her flying  due to her Unruptured Aneurysm .  Pt is asking for a call from him on if she can or can not fly.  Pt states she needs a response as quickly as possible so air travel arrangements can be made for this trip in the month of Feb of '24.

## 2021-11-26 DIAGNOSIS — E785 Hyperlipidemia, unspecified: Secondary | ICD-10-CM | POA: Diagnosis not present

## 2021-11-26 DIAGNOSIS — I639 Cerebral infarction, unspecified: Secondary | ICD-10-CM | POA: Diagnosis not present

## 2021-11-26 DIAGNOSIS — I2510 Atherosclerotic heart disease of native coronary artery without angina pectoris: Secondary | ICD-10-CM | POA: Diagnosis not present

## 2021-11-26 DIAGNOSIS — R7303 Prediabetes: Secondary | ICD-10-CM | POA: Diagnosis not present

## 2021-11-26 DIAGNOSIS — I10 Essential (primary) hypertension: Secondary | ICD-10-CM | POA: Diagnosis not present

## 2021-11-26 NOTE — Telephone Encounter (Signed)
Pt came by the office today and response was relayed via check in staff Kelby Aline.

## 2021-11-26 NOTE — Telephone Encounter (Signed)
Garvin Fila, MD  You 14 hours ago (5:02 PM)    No risk. She can fly

## 2022-01-15 DIAGNOSIS — I10 Essential (primary) hypertension: Secondary | ICD-10-CM | POA: Diagnosis not present

## 2022-01-15 DIAGNOSIS — Z8673 Personal history of transient ischemic attack (TIA), and cerebral infarction without residual deficits: Secondary | ICD-10-CM | POA: Diagnosis not present

## 2022-01-15 DIAGNOSIS — E7800 Pure hypercholesterolemia, unspecified: Secondary | ICD-10-CM | POA: Diagnosis not present

## 2022-01-15 DIAGNOSIS — E1169 Type 2 diabetes mellitus with other specified complication: Secondary | ICD-10-CM | POA: Diagnosis not present

## 2022-02-18 ENCOUNTER — Telehealth: Payer: Self-pay | Admitting: Neurology

## 2022-02-18 NOTE — Telephone Encounter (Signed)
Noted  

## 2022-02-18 NOTE — Telephone Encounter (Signed)
[employer] CY#[id number] [phone] Payer KU#[id number] [employer]PPO [employer] Provider [phone]

## 2022-02-19 ENCOUNTER — Telehealth: Payer: Self-pay | Admitting: Neurology

## 2022-02-19 NOTE — Telephone Encounter (Signed)
LVM and sent mychart msg informing pt of need to reschedule 03/05/22 appointment - MD out

## 2022-03-02 ENCOUNTER — Other Ambulatory Visit: Payer: Self-pay

## 2022-03-02 MED ORDER — CLOPIDOGREL BISULFATE 75 MG PO TABS: 75.0000 mg | ORAL_TABLET | Freq: Every day | ORAL | 3 refills | Status: DC

## 2022-03-03 ENCOUNTER — Ambulatory Visit: Payer: Medicare Other | Admitting: Neurology

## 2022-03-03 ENCOUNTER — Encounter: Payer: Self-pay | Admitting: Neurology

## 2022-03-03 ENCOUNTER — Other Ambulatory Visit: Payer: Self-pay

## 2022-03-03 ENCOUNTER — Telehealth: Payer: Self-pay

## 2022-03-03 ENCOUNTER — Telehealth: Payer: Self-pay | Admitting: Neurology

## 2022-03-03 VITALS — BP 144/66 | HR 75 | Ht 66.0 in | Wt 173.0 lb

## 2022-03-03 DIAGNOSIS — Z8673 Personal history of transient ischemic attack (TIA), and cerebral infarction without residual deficits: Secondary | ICD-10-CM | POA: Diagnosis not present

## 2022-03-03 DIAGNOSIS — I671 Cerebral aneurysm, nonruptured: Secondary | ICD-10-CM

## 2022-03-03 MED ORDER — CLOPIDOGREL BISULFATE 75 MG PO TABS: 75.0000 mg | ORAL_TABLET | Freq: Every day | ORAL | 0 refills | Status: AC

## 2022-03-03 MED ORDER — CLOPIDOGREL BISULFATE 75 MG PO TABS: 75.0000 mg | ORAL_TABLET | Freq: Every day | ORAL | 3 refills | Status: DC

## 2022-03-03 NOTE — Progress Notes (Signed)
STROKE NEUROLOGY FOLLOW UP NOTE  NAME: [name] DOB: [birth year]  REASON FOR VISIT: stroke follow up HISTORY FROM: pt and chart  Today we had the pleasure of seeing [name] in follow-up at our [facility]. Pt was accompanied by no one.   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   She stated that she had stroke in 04/2005 but no residue and followed with Dr. Leonie Man in clinic, last seen 03/2015. For the last 6 months to a year, she started to have right sided neck pain, stiffness, with right shoulder and lateral arm pain with mildly limited ROM, with radiating pain to the forearm and right palm. Comes and goes, sometimes can be intense, but other time dull achy. It can happen every the other day. She also has LBP comes and goes, sometimes hard to get out of the car due to LBP.  However, last Monday, she had right side facial numbness, at the left cheek area, feels like norvacaine shot at dental office. The numbness lasted about one day and resolved. She denies any HA, weakness, speech or swallow difficulty, no arm/leg issues at that time. She is compliant with aggrenox and lipitor.  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 Patient has previously met with Dr. Estanislado Pandy three times to discuss endovascular treatment and chosen conservative monitoring and follow up.  She had HTN and stable taking meds, today BP 129/66. She denies DM and she check her glucose at home was 115-130.    Update 11/21/2015 : She returns for follow-up after last visit with Dr. showed 2 months  ago. She states she's not had any further episodes of numbness on cheek. She feels muscle tightness in the back of her head and neck but denies significant radicular pain. She did have an episode of her right hand fingers locking up after and morning when she was taking taken pending her wrist a lot in the kitchen. She did not undergo MRI scan the brain and cervical spine has been never got a call to schedule that but have now been scheduled for 12/02/15. Similarly CT angiogram of the brain for follow-up of aneurysms is also scheduled for the same day. Patient was seen by gastroenterologist Dr. Collene Mares today for rectal bleeding and is scheduled to undergo colonoscopy in December. She is concerned about risk of stroke and stopping Aggrenox for 5 days prior to the procedure.  Update 05/05/2016 : She returns for follow-up after last visit 6 months ago. She continues to do well without recurrent stroke or TIA symptoms. She had follow-up CT angiogram of the brain and neck done on 12/02/15 which showed stable appearance of her intracranial aneurysms. Patient states his started walking regularly and has lost 20 pounds. She occasionally hears her heartbeat in the ears particularly at night when she is lying down quietly. She denies significant stress. She is also noticed some intermittent numbness in the lower lateral aspect of the left foot. She does agree that she sleeps on her left side a lot. She denies significant back pain or radicular  pain. She has not had any recurrent stroke or TIA symptoms. She states her blood pressure is well controlled today it is 120/63. Update 11/29/2017 : She returns for follow-up after last visit a year ago.  She continues to do well without definite stroke or TIA symptoms now for greater than 10 years.  She was however seen in the emergency room on 06/29/2017 and she woke up with tingling in the left hand as well as left foot.  She was seen by neurologist Dr. Cheral Marker who felt her symptoms are  not comparable with stroke.  She did have a prior history of carpal tunnel and hand numbness seems similar to this.  I had previously seen her for left foot numbness which I thought was related to peroneal entrapment neuropathy.  Patient had a CT scan of the head done which showed no acute abnormality.  Patient states the symptoms have not recurred since then.  She remains on Aggrenox which is tolerating well without side effects.  Her blood pressure fluctuates a bit at times it is in the low 110 range but today it is 140/78.  She takes metoprolol 25 mg daily.  She states her lipid profile is well controlled and she is on Lipitor 20 and Zetia.  She does follow-up regularly with a primary physician.  She had CT angiogram of the brain done on 12/28/2016 which showed stable appearance of her intracranial aneurysms.  She is due for a follow-up CTA next month. Update 03/22/2018 : She returns for follow-up after last visit 3 months ago.  She was hospitalized for a small thalamic lacunar infarct in December 2019.  She complained of some numbness and incoordination of her hand as well as felt jittery and tremulous.  The symptoms are similar to her previous stroke in 2007 and she went to the hospital.  MRI scan of the brain was obtained which showed a tiny acute lacunar infarct in the left thalamus.  Carotid ultrasound was unremarkable.  She had recently had CT angiogram of the brain in November 2019 for follow-up of her asymptomatic left cavernous and pericallosal aneurysm since it was not repeated.  Echocardiogram showed normal ejection fraction.  LDL cholesterol was 95 mg percent.  Hemoglobin A1c was 5.7.  Patient had been on Aggrenox following her previous stroke since 2007.  This was changed to aspirin and Plavix.  She was advised to stop aspirin after 3 weeks was Ms. misunderstood this and is currently on both.  She is tolerating them well without bruising or bleeding.  She states her numbness and clumsiness has  resolved completely.  She has no focal deficits.  She is on Lipitor now 40 mg daily.  She has not had any follow-up lipid profile checked.  She has been keeping track of her blood pressure and is worried that her morning systolic ranges in the 448J range.  She has an upcoming visit with her primary care physician Dr. Concha Pyo and plans to discuss this.  She is also noticed some lumps beneath her right jaw which are probably enlarged lymph nodes.  She denies any significant tooth pain, recurrent sinus infections. Update 12/13/2018 : She returns for follow-up after last visit 9 months ago.  She states she is doing well she has had no further recurrent stroke or TIA symptoms.  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  She still has some intermittent anxiety and tremulousness and she gets nervous.  Her blood pressure tends to fluctuate but when  she is relaxed it is normal.  Today it is slightly elevated in the office at 152/86.  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  She states she was had a chronic reflux symptoms over the last few months and was tested for Covid multiple times.  She was positive initially then next time indeterminate and then positive again.  She isolated herself a few weeks.  Subsequently she is been tested twice and has stayed negative.  Interestingly she had multiple family members were also tested positive in the time but nobody had any symptoms of Covid infection and were not sick requiring any hospitalization.  She remains on Plavix which is tolerating well without bruising or bleeding.  She has no new complaints.  Her last CT angiogram of the brain to follow-up on aneurysms was a year ago. Update 03/04/2020 ; she returns for follow-up after last visit in November 2020.  She continues to do well and has not had recurrent TIA or stroke symptoms.  She remains on Plavix which is tolerating well without bruising or bleeding.  She remains on  Lipitor which is tolerating well as well without muscle aches and pains.  She had lipid profile checked 3 months ago her primary physician and her satisfactory.  Her blood pressure is well controlled at home usually but today it is elevated in office slightly at 160/72.  She had follow-up CT angiogram done on 01/04/2020 which I personally reviewed shows stable appearance of the 8 mm left cavernous carotid and 3 to 4 mm right cavernous carotid aneurysms.  She has no new complaints today 03/04/2021: She returns for follow-up after last visit a year ago.  She continues to do well and has not had recurrent TIA or stroke symptoms now for several years.  She is pretty active and exercises regularly.  She had colonoscopy done last year which showed no significant polyps.  She remains on Clopidogrel which is tolerating well without bleeding or bruising.  Blood pressure is normally well controlled at home though today it is elevated in the office and she admits she had missed taking her morning dose of blood pressure medicine.  She states her sugars are under very good control.  She remains on Lipitor which is tolerating well without muscle aches and pains.  She does see primary physician regularly and will have follow-up lipid profile and A1c checked.  She has no complaints today. Update 03/03/2022 : She returns for follow-up after last visit a year ago.  He has been doing well without any No several years.  Remains quite active and exercises regularly.  He is a little upset as she is almost out of her Plavix.  Her refill for plavix  90-day supply.has not yet gone  to [facility] so she will be out of it and is requesting a 10 day supply prescription from her  local pharmacy .  She remains on Plavix which is tolerating well without bruising or bleeding.  Her blood pressure is usually under good control with systolic in the 195-093 range.  She remains on Lipitor which she is tolerating well without muscle aches and pains her last  lipid profile was not satisfactory however I am unable to find any   recent  labs when I look in the computer.  He has no new complaints.  Her last cerebral CT angiogram was more than 2 years ago she is due for repeat 1 for aneurysm surveillance  REVIEW OF SYSTEMS: Full 14 system review of systems performed and notable only for those listed below and in HPI above, all others are negative:       joint pain, muscle cramps, insomnia, not enough sleep all othersystems negative The following represents the patient's updated allergies and side effects list: Allergies  Allergen Reactions   Flagyl [Metronidazole] Itching and Swelling    The neurologically relevant items on the patient's problem list were reviewed on today's visit.  Neurologic Examination  A problem focused neurological exam (12 or more points of the single system neurologic examination, vital signs counts as 1 point, cranial nerves count for 8 points) was performed.  Blood pressure (!) 144/66, pulse 75, height '5\' 6"'$  (1.676 m), weight 173 lb (78.5 kg).  General - Well nourished, well developed elderly African-American lady, in no apparent distress.  Ophthalmologic - funduscopic exam not done   Cardiovascular - Regular rate and rhythm with no murmur.  Mental Status -  Level of arousal and orientation to time, place, and person were intact. Language including expression, naming, repetition, comprehension was assessed and found intact.  No aphasia apraxia or dysarthria. Fund of Knowledge was assessed and was intact.  Cranial Nerves II - XII - II - Visual field intact OU. III, IV, VI - Extraocular movements intact. V - Facial sensation intact bilaterally. VII - Facial movement intact bilaterally. VIII - Hearing & vestibular intact bilaterally. X - Palate elevates symmetrically. XI - Chin turning & shoulder shrug intact bilaterally. XII - Tongue protrusion intact.  Motor Strength - The patient's strength was normal in all  extremities and pronator drift was absent.  Bulk was normal and fasciculations were absent.   Motor Tone - Muscle tone was assessed at the neck and appendages and was normal.  Reflexes - The patient's reflexes were 1+ in all extremities and she had no pathological reflexes.  Sensory - Light touch, temperature/pinprick were assessed and were normal.    Coordination - The patient had normal movements in the hands and feet with no ataxia or dysmetria.  Tremor was absent.  Gait and Station - The patient's transfers, posture, gait, station, and turns were observed as normal.  Mild difficulty in tandem walking persists.  Data reviewed: I personally reviewed the images and agree with the radiology interpretations.  CT angio brain and neck 12/02/2015 :  1. 8 mm left cavernous ICA aneurysm is unchanged compared to 08/18/2014. 2. 2-3 mm right ophthalmic ICA aneurysm is unchanged from prior. 3. 1-2 mm outpouching from the supraclinoid left ICA is stable and favors infundibulum. CT angio head and neck 01/03/2020:  1. Unchanged 8 mm left cavernous and 3 mm right paraophthalmic ICA Aneurysms. 2. 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 Old LEFT centrum semiovale lacunar infarct. No acute findings. Assessment:   she is a 77 y.o. African American female with PMH of HTN, hyperlipidemia brain aneurysm and left BG infarct 04/2005  And asymptomatic cerebral aneurysms which have remained stable since follow-up since 2007.  Last CT angio being in December 2021.  Transient episode of left hand and feet paresthesias of unclear etiology in June 2019.  History of mild carpal tunnel syndrome and peroneal neuropathy which appears stable.  H/o right thalamic lacunar infarct in December 2019 from which she has made a full recovery.  She remained stable from neurovascular standpoint Plan:  I had a long discussion with the patient with regards to her  remote stroke as well as asymptomatic bilateral cavernous carotid aneurysms and she appears to be quite stable from neurovascular standpoint.  I recommend she continue Plavix for stroke prevention and maintain aggressive risk factor modification with strict control of hypertension and blood pressure goal below 140/90, lipids with LDL cholesterol goal below 70 mg percent and diabetes with hemoglobin A1c goal below 6.5%.  Continue conservative follow-up for her asymptomatic cerebral aneurysms and will repeat CT angiograms for aneurysm surveillance as the last study was more than 2 years ago.  Also check follow-up lipid profile and hemoglobin A1c.  She was also given a 10-day prescription for Plavix as she is going to run out of it before her [facility]monthsupply comes [facility]  Return for follow-up in the future in 1 year or call earlier if necessary..Marland KitchenMarland Kitchenreater than 50% time during this 35 minute visit was spent on counseling and coordination of care about her thalamic lacunar infarct, asymptomatic cerebral aneurysms, stroke and TIA risk, left foot peroneal neuropathy and answering questions  . PAntony Contras MD  [facility] [address]SNationalGSaddle [place] [address] Phone [phone] [phone] PAntony Contras MD [facility] [address] [place] [phone]

## 2022-03-03 NOTE — Telephone Encounter (Signed)
[employer] medic[employer] NPR sent to GI [phone]

## 2022-03-03 NOTE — Patient Instructions (Signed)
I had a long discussion with the patient with regards to her remote stroke as well as asymptomatic bilateral cavernous carotid aneurysms and she appears to be quite stable from neurovascular standpoint.  I recommend she continue Plavix for stroke prevention and maintain aggressive risk factor modification with strict control of hypertension and blood pressure goal below 140/90, lipids with LDL cholesterol goal below 70 mg percent and diabetes with hemoglobin A1c goal below 6.5%.  Continue conservative follow-up for her asymptomatic cerebral aneurysms and will repeat CT angiograms for aneurysm surveillance as the last study was more than 2 years ago.  Also check follow-up lipid profile and hemoglobin A1c.  She was also given a 10-day prescription for Plavix as she is going to run out of it before her 29[facility]monthsupply comes [facility]  Return for follow-up in the future in 1 year or call earlier if necessary.

## 2022-03-03 NOTE — Telephone Encounter (Signed)
Pt called the office today and [facility] not [facility] her [facility]- I have resubmitted today. She is on the schedule for f/u with Dr. Leonie Man for this afternoon.

## 2022-03-04 LAB — HEMOGLOBIN A1C
Est. average glucose Bld gHb Est-mCnc: 143 mg/dL
Hgb A1c MFr Bld: 6.6 % — ABNORMAL HIGH (ref 4.8–5.6)

## 2022-03-04 LAB — LIPID PANEL
Chol/HDL Ratio: 2.7 ratio (ref 0.0–4.4)
Cholesterol, Total: 153 mg/dL (ref 100–199)
HDL: 56 mg/dL (ref >39)
LDL Chol Calc (NIH): 75 mg/dL (ref 0–99)
Triglycerides: 125 mg/dL (ref 0–149)
VLDL Cholesterol Cal: 22 mg/dL (ref 5–40)

## 2022-03-05 ENCOUNTER — Ambulatory Visit: Payer: Medicare HMO | Admitting: Neurology

## 2022-03-08 NOTE — Progress Notes (Signed)
Kindly inform the patient that both screening test for diabetes and cholesterol profile are slightly abnormal and she needs to see her primary care physician for further advice from medication changes

## 2022-03-09 ENCOUNTER — Telehealth: Payer: Self-pay

## 2022-03-09 NOTE — Telephone Encounter (Signed)
-----   Message from Garvin Fila, MD sent at 03/08/2022  4:10 PM EST ----- Kindly inform the patient that both screening test for diabetes and cholesterol profile are slightly abnormal and she needs to see her primary care physician for further advice from medication changes

## 2022-04-02 ENCOUNTER — Other Ambulatory Visit: Payer: Self-pay

## 2022-04-20 ENCOUNTER — Other Ambulatory Visit: Payer: Self-pay

## 2022-04-20 ENCOUNTER — Ambulatory Visit
Admission: RE | Admit: 2022-04-20 | Discharge: 2022-04-20 | Disposition: A | Discharge status: Home / Self Care | Payer: Medicare Other | Source: Ambulatory Visit | Attending: Neurology | Admitting: Neurology

## 2022-04-20 MED ORDER — IOPAMIDOL (ISOVUE-370) INJECTION 76%
75.0000 mL | Freq: Once | INTRAVENOUS | Status: AC | PRN
  Administered 2022-04-20: 75 mL via INTRAVENOUS

## 2022-04-23 ENCOUNTER — Telehealth: Payer: Self-pay | Admitting: Neurology

## 2022-04-23 NOTE — Telephone Encounter (Signed)
Pt called, Stated she needs to talk to Dr. Leonie Man about her Angio Gram results. Stated she wants to talk to [place] and not the nurse.

## 2022-04-27 NOTE — Progress Notes (Signed)
I called and spoke to the patient and gave results of CT angiogram showing stable appearance of 8 mm aneurysm and other small aneurysm is not seen

## 2022-04-28 ENCOUNTER — Ambulatory Visit: Payer: Medicare HMO | Admitting: Neurology

## 2022-11-24 ENCOUNTER — Encounter (HOSPITAL_COMMUNITY): Payer: Self-pay

## 2022-11-24 ENCOUNTER — Ambulatory Visit (INDEPENDENT_AMBULATORY_CARE_PROVIDER_SITE_OTHER): Payer: Medicare Other

## 2022-11-24 ENCOUNTER — Ambulatory Visit (HOSPITAL_COMMUNITY)
Admission: EM | Admit: 2022-11-24 | Discharge: 2022-11-24 | Disposition: A | Discharge status: Home / Self Care | Payer: Medicare Other | Attending: Internal Medicine | Admitting: Internal Medicine

## 2022-11-24 DIAGNOSIS — J069 Acute upper respiratory infection, unspecified: Secondary | ICD-10-CM

## 2022-11-24 NOTE — ED Provider Notes (Signed)
[facility]    CSN: [id number] Arrival date & time: 11/24/22  1529      History   Chief Complaint Chief Complaint  Patient presents with   Cough   Nasal Congestion    HPI [name] is a 77 y.o. female.    Cough Associated symptoms: rhinorrhea   Associated symptoms: no chest pain, no chills, no fever, no headaches, no shortness of breath, no sore throat and no wheezing   Concerned she may have pneumonia.  Has had cold symptoms for 1 week including nasal congestion dry cough.  Denies fever, chills, sweats, chest pain, shortness of breath, wheezing, nausea, vomiting. She was able to do her usual 3 mile walk today without difficulty  Past Medical History:  Diagnosis Date   Anemia    Cystic breast    Cystic breast    History of chicken pox    History of measles    History of mumps    Hypertension    Stroke [facility])    TIA   Trichomonas    Trichomonas    Yeast infection     Patient Active Problem List   Diagnosis Date Noted   Osteoarthritis of knee 06/01/2019   Atrophic vaginitis 09/13/2018   Uterine leiomyoma 09/13/2018   Gastroesophageal reflux disease without esophagitis 07/25/2018   Globus pharyngeus 07/25/2018   Submandibular swelling 07/25/2018   Essential hypertension 01/25/2018   HLD (hyperlipidemia) 01/25/2018   Anxiety 01/25/2018   CVA (cerebral vascular accident) (HCC) 01/24/2018   Disorder of shoulder 03/18/2017   Pain in joint of right shoulder 03/18/2017   TIA (transient ischemic attack) 09/20/2015   DDD (degenerative disc disease), cervical 09/20/2015   History of stroke 09/20/2015   Aneurysm, cerebral, nonruptured 03/28/2015   Bilateral leg pain 10/26/2011   Edema of lower extremity 10/26/2011   Pelvic pain in female 08/25/2011   Yeast infection    Trichomoniasis    Cystic breast    Stroke (HCC)    History of chicken pox    History of measles    History of mumps     Past Surgical History:  Procedure Laterality  Date   DILATION AND CURETTAGE OF UTERUS     excision of fibroadenoma      OB History     Gravida  2   Para  2   Term  2   Preterm      AB      Living  2      SAB      IAB      Ectopic      Multiple      Live Births  2            Home Medications    Prior to Admission medications   Medication Sig Start Date End Date Taking? Authorizing Provider  acetaminophen (TYLENOL) 500 MG tablet Take 500 mg by mouth every 6 (six) hours as needed for mild pain or moderate pain.     [provider]  ALPRAZolam Prudy Feeler) 1 MG tablet Take 1 mg by mouth 2 (two) times daily as needed for anxiety.  11/19/15   [provider]  atorvastatin (LIPITOR) 40 MG tablet Take 1 tablet (40 mg total) by mouth daily at 6 PM. 01/25/18   Marguerita Merles Latif, DO  betamethasone dipropionate 0.05 % cream as needed. 02/27/19   [provider]  Blood Glucose Monitoring Suppl (TRUE METRIX METER) w/Device KIT as needed. 09/05/19   [provider]  brimonidine (ALPHAGAN) 0.15 % ophthalmic solution Place 1 drop into both eyes 2 (two) times daily.     [provider]  clopidogrel (PLAVIX) 75 MG tablet Take 1 tablet (75 mg total) by mouth daily. 03/03/22   Micki Riley, MD  ezetimibe (ZETIA) 10 MG tablet Take 10 mg by mouth at bedtime.  11/23/17   [provider]  latanoprost (XALATAN) 0.005 % ophthalmic solution Place 1 drop into both eyes at bedtime. 02/25/14   [provider]  metoprolol succinate (TOPROL-XL) 50 MG 24 hr tablet at bedtime. 02/27/19   [provider]  senna-docusate (SENOKOT-S) 8.6-50 MG tablet Take 1 tablet by mouth at bedtime as needed for mild constipation. 01/25/18   Marguerita Merles Latif, DO  TRUE METRIX BLOOD GLUCOSE TEST test strip as needed. 10/01/19   [provider]  valsartan-hydrochlorothiazide (DIOVAN-HCT) 160-12.5 MG tablet daily. 02/27/19   [provider]    Family History Family History   Problem Relation Age of Onset   Cancer Mother    Cancer Father    Deep vein thrombosis Sister    Diabetes Sister    Hyperlipidemia Sister    Diabetes Daughter    Hyperlipidemia Daughter     Social History Social History   Tobacco Use   Smoking status: Never   Smokeless tobacco: Never  Vaping Use   Vaping status: Never Used  Substance Use Topics   Alcohol use: No    Alcohol/week: 0.0 standard drinks of alcohol   Drug use: No     Allergies   Flagyl [metronidazole]   Review of Systems Review of Systems  Constitutional:  Negative for chills, fatigue and fever.  HENT:  Positive for congestion and rhinorrhea. Negative for sinus pressure and sore throat.   Respiratory:  Positive for cough. Negative for shortness of breath and wheezing.   Cardiovascular:  Negative for chest pain.  Gastrointestinal:  Negative for abdominal pain, diarrhea and nausea.  Neurological:  Negative for headaches.     Physical Exam Triage Vital Signs ED Triage Vitals  Encounter Vitals Group     BP 11/24/22 1635 (!) 159/72     Systolic BP Percentile --      Diastolic BP Percentile --      Pulse Rate 11/24/22 1635 90     Resp 11/24/22 1635 18     Temp 11/24/22 1635 98.1 F (36.7 C)     Temp Source 11/24/22 1635 Oral     SpO2 11/24/22 1635 96 %     Weight --      Height --      Head Circumference --      Peak Flow --      Pain Score 11/24/22 1637 0     Pain Loc --      Pain Education --      Exclude from Growth Chart --    No data found.  Updated Vital Signs BP (!) 159/72 (BP Location: Right Arm)   Pulse 90   Temp 98.1 F (36.7 C) (Oral)   Resp 18   SpO2 96%   Visual Acuity Right Eye Distance:   Left Eye Distance:   Bilateral Distance:    Right Eye Near:   Left Eye Near:    Bilateral Near:     Physical Exam Vitals reviewed.  Constitutional:      Appearance: She is not ill-appearing.  HENT:     Head: Normocephalic and atraumatic.     Right Ear:  Tympanic membrane  normal.     Left Ear: Tympanic membrane normal.     Nose: No rhinorrhea.     Mouth/Throat:     Mouth: Mucous membranes are moist.     Pharynx: Oropharynx is clear.  Eyes:     Conjunctiva/sclera: Conjunctivae normal.  Cardiovascular:     Rate and Rhythm: Normal rate and regular rhythm.     Heart sounds: Normal heart sounds.  Pulmonary:     Effort: Pulmonary effort is normal. No respiratory distress.     Breath sounds: Normal breath sounds. No wheezing, rhonchi or rales.  Musculoskeletal:     Cervical back: Neck supple.  Neurological:     Mental Status: She is oriented to person, place, and time.      UC Treatments / Results  Labs (all labs ordered are listed, but only abnormal results are displayed) Labs Reviewed - No data to display  EKG   Radiology No results found.  Procedures Procedures (including critical care time)  Medications Ordered in UC Medications - No data to display  Initial Impression / Assessment and Plan / UC Course  I have reviewed the triage vital signs and the nursing notes.  Pertinent labs & imaging results that were available during my care of the patient were reviewed by me and considered in my medical decision making (see chart for details).    77 year old female with concern for pneumonia has had URI symptoms for 1 week.  Denies chest pain, shortness of breath, wheezing, fever, chills, sweats.She is mildly hypertensive at 159/72 but afebrile, normal heart rate normal respiratory rate normal oxygen saturation. Discussed with patient I do not feel she has pneumonia on history and exam.  She states she would like an x-ray to make sure she does not have walking pneumonia.  Discussed the risks of x-ray with patient including exposure to radiation.  Admits understanding requesting the chest x-ray X-ray dependently viewed by me no obvious infiltrate normal heart size Contact patient if there is a radiologic discrepancy   Final Clinical  Impressions(s) / UC Diagnoses   Final diagnoses:  None   Discharge Instructions   None    ED Prescriptions   None    PDMP not reviewed this encounter.   Meliton Rattan, [place] 11/24/22 1710

## 2022-11-24 NOTE — Discharge Instructions (Signed)
The x-ray reading we discussed is preliminary. Your x-ray will be read by a radiologist in next few hours. If there is a discrepancy, you will be contacted, and instructed on a new plan for you care.

## 2022-11-24 NOTE — ED Triage Notes (Signed)
Pt presents with non-productive cough and "a runny nose." Pt's main concern at this time is pneumonia, would like it to be ruled out. Pt has taken Tylenol for symptoms, last dose was yesterday evening. Pt also reports "I rubbed Vicks vapor rub on my chest." When asking if it helped with symptoms, pt states "well it did not get any worse, I guess it did."

## 2023-01-14 ENCOUNTER — Other Ambulatory Visit (HOSPITAL_COMMUNITY): Payer: Self-pay | Admitting: Internal Medicine

## 2023-01-14 DIAGNOSIS — E1169 Type 2 diabetes mellitus with other specified complication: Secondary | ICD-10-CM

## 2023-01-23 ENCOUNTER — Other Ambulatory Visit: Payer: Self-pay | Admitting: Neurology

## 2023-01-25 NOTE — Telephone Encounter (Signed)
Rx refilled per last OV note

## 2023-01-29 ENCOUNTER — Encounter: Payer: Self-pay | Admitting: Podiatry

## 2023-01-29 ENCOUNTER — Ambulatory Visit: Payer: Medicare Other | Admitting: Podiatry

## 2023-01-29 DIAGNOSIS — M79675 Pain in left toe(s): Secondary | ICD-10-CM | POA: Diagnosis not present

## 2023-01-29 NOTE — Patient Instructions (Signed)
  You can use [employer]  NAIL GEL on the ticker toenail  --  If was nice to meet you today. If you have any questions or any further concerns, please feel fee to give me a call. You can call our office at [phone] or please feel fee to send me a message through MyChart.   --  Soak Instructions    THE DAY AFTER THE PROCEDURE  Place 1/4 cup of epsom salts in a quart of warm tap water.  Submerge your foot or feet with outer bandage intact for the initial soak; this will allow the bandage to become moist and wet for easy lift off.  Once you remove your bandage, continue to soak in the solution for 20 minutes.  This soak should be done twice a day.  Next, remove your foot or feet from solution, blot dry the affected area and cover.  You may use a band aid large enough to cover the area or use gauze and tape.  Apply other medications to the area as directed by the doctor such as polysporin neosporin.  IF YOUR SKIN BECOMES IRRITATED WHILE USING THESE INSTRUCTIONS, IT IS OKAY TO SWITCH TO  WHITE VINEGAR AND WATER. Or you may use antibacterial soap and water to keep the toe clean  Monitor for any signs/symptoms of infection. Call the office immediately if any occur or go directly to the emergency room. Call with any questions/concerns.

## 2023-01-29 NOTE — Progress Notes (Signed)
 Subjective:   Patient ID: [name], female   DOB: 78 y.o.   MRN: [id number]   HPI Chief Complaint  Patient presents with   Ingrown Toenail    RM#13 Left ingrown nail big toe very painful for several months now.   78 year old female presents the office with above concerns.  She states the left toenail has been hurting quite a bit.  She has not seen any drainage or pus coming from the nail.  She used to follow-up with Dr. Burt last seen February 2023 for nail trim.  No recent treatment.  No injuries.  She is on Plavix .   Review of Systems  All other systems reviewed and are negative.  Past Medical History:  Diagnosis Date   Anemia    Cystic breast    Cystic breast    History of chicken pox    History of measles    History of mumps    Hypertension    Stroke (HCC)    TIA   Trichomonas    Trichomonas    Yeast infection     Past Surgical History:  Procedure Laterality Date   DILATION AND CURETTAGE OF UTERUS     excision of fibroadenoma       Current Outpatient Medications:    acetaminophen  (TYLENOL ) 500 MG tablet, Take 500 mg by mouth every 6 (six) hours as needed for mild pain or moderate pain. , Disp: , Rfl:    ALPRAZolam  (XANAX ) 1 MG tablet, Take 1 mg by mouth 2 (two) times daily as needed for anxiety. , Disp: , Rfl:    atorvastatin  (LIPITOR) 40 MG tablet, Take 1 tablet (40 mg total) by mouth daily at 6 PM., Disp: 30 tablet, Rfl: 0   betamethasone dipropionate 0.05 % cream, as needed., Disp: , Rfl:    Blood Glucose Monitoring Suppl (TRUE METRIX METER) w/Device KIT, as needed., Disp: , Rfl:    brimonidine  (ALPHAGAN ) 0.15 % ophthalmic solution, Place 1 drop into both eyes 2 (two) times daily. , Disp: , Rfl:    clopidogrel  (PLAVIX ) 75 MG tablet, TAKE 1 TABLET BY MOUTH DAILY, Disp: 90 tablet, Rfl: 1   ezetimibe  (ZETIA ) 10 MG tablet, Take 10 mg by mouth at bedtime. , Disp: , Rfl:    latanoprost  (XALATAN ) 0.005 % ophthalmic solution, Place 1 drop into both eyes  at bedtime., Disp: , Rfl:    metoprolol  succinate (TOPROL -XL) 50 MG 24 hr tablet, at bedtime., Disp: , Rfl:    senna-docusate (SENOKOT-S) 8.6-50 MG tablet, Take 1 tablet by mouth at bedtime as needed for mild constipation., Disp: 30 tablet, Rfl: 0   TRUE METRIX BLOOD GLUCOSE TEST test strip, as needed., Disp: , Rfl:    valsartan-hydrochlorothiazide (DIOVAN-HCT) 160-12.5 MG tablet, daily., Disp: , Rfl:   Allergies  Allergen Reactions   Flagyl [Metronidazole] Itching and Swelling           Objective:  Physical Exam  General: AAO x3, NAD  Dermatological: Left hallux toenail is hypertrophic and dystrophic with brown discoloration.  Mild incurvation of nail border.  There is no edema, erythema or signs of infection.  Vascular: Dorsalis Pedis artery and Posterior Tibial artery pedal pulses are 2/4 bilateral with immedate capillary fill time. There is no pain with calf compression, swelling, warmth, erythema.   Neruologic: Grossly intact via light touch bilateral.  Musculoskeletal: Tenderness to the left hallux toenail.      Assessment:   Left hallux toenail pain     Plan:  -Treatment options  discussed including all alternatives, risks, and complications -Etiology of symptoms were discussed -We discussed different treatment options including nail removal versus debridement.  She wants to proceed with debridement of the nail for nail removal.  Given the tenderness to use lidocaine cream to numb the nail.  Is able to sharply debride the nail without any complications or bleeding.  Should her symptoms persist we will need to likely proceed with nail removal.  Monitoring signs or symptoms of infection.  Return in about 3 months (around 04/29/2023).  Donnice JONELLE Fees DPM

## 2023-02-10 DIAGNOSIS — Z87891 Personal history of nicotine dependence: Secondary | ICD-10-CM | POA: Diagnosis not present

## 2023-02-16 ENCOUNTER — Ambulatory Visit (HOSPITAL_COMMUNITY)
Admission: RE | Admit: 2023-02-16 | Discharge: 2023-02-16 | Disposition: A | Discharge status: Home / Self Care | Payer: Self-pay | Source: Ambulatory Visit | Attending: Internal Medicine | Admitting: Internal Medicine

## 2023-02-16 DIAGNOSIS — E1169 Type 2 diabetes mellitus with other specified complication: Secondary | ICD-10-CM | POA: Insufficient documentation

## 2023-02-18 ENCOUNTER — Other Ambulatory Visit: Payer: Self-pay

## 2023-02-18 ENCOUNTER — Encounter (HOSPITAL_COMMUNITY): Payer: Self-pay | Admitting: Emergency Medicine

## 2023-02-18 ENCOUNTER — Emergency Department (HOSPITAL_COMMUNITY): Payer: Medicare HMO

## 2023-02-18 ENCOUNTER — Emergency Department (HOSPITAL_COMMUNITY)
Admission: EM | Admit: 2023-02-18 | Discharge: 2023-02-18 | Discharge status: Against Medical Advice | Payer: Medicare HMO | Attending: Emergency Medicine | Admitting: Emergency Medicine

## 2023-02-18 DIAGNOSIS — G311 Senile degeneration of brain, not elsewhere classified: Secondary | ICD-10-CM | POA: Diagnosis not present

## 2023-02-18 DIAGNOSIS — Z8673 Personal history of transient ischemic attack (TIA), and cerebral infarction without residual deficits: Secondary | ICD-10-CM | POA: Insufficient documentation

## 2023-02-18 DIAGNOSIS — R252 Cramp and spasm: Secondary | ICD-10-CM | POA: Diagnosis not present

## 2023-02-18 DIAGNOSIS — Z5321 Procedure and treatment not carried out due to patient leaving prior to being seen by health care provider: Secondary | ICD-10-CM | POA: Diagnosis not present

## 2023-02-18 DIAGNOSIS — M1712 Unilateral primary osteoarthritis, left knee: Secondary | ICD-10-CM | POA: Diagnosis not present

## 2023-02-18 DIAGNOSIS — M1711 Unilateral primary osteoarthritis, right knee: Secondary | ICD-10-CM | POA: Diagnosis not present

## 2023-02-18 DIAGNOSIS — M79642 Pain in left hand: Secondary | ICD-10-CM | POA: Diagnosis not present

## 2023-02-18 DIAGNOSIS — R200 Anesthesia of skin: Secondary | ICD-10-CM | POA: Insufficient documentation

## 2023-02-18 LAB — CBC WITH DIFFERENTIAL/PLATELET
Abs Immature Granulocytes: 0.01 10*3/uL (ref 0.00–0.07)
Basophils Absolute: 0 10*3/uL (ref 0.0–0.1)
Basophils Relative: 1 %
Eosinophils Absolute: 0 10*3/uL (ref 0.0–0.5)
Eosinophils Relative: 1 %
HCT: 36.6 % (ref 36.0–46.0)
Hemoglobin: 12.2 g/dL (ref 12.0–15.0)
Immature Granulocytes: 0 %
Lymphocytes Relative: 25 %
Lymphs Abs: 1.3 10*3/uL (ref 0.7–4.0)
MCH: 30.3 pg (ref 26.0–34.0)
MCHC: 33.3 g/dL (ref 30.0–36.0)
MCV: 90.8 fL (ref 80.0–100.0)
Monocytes Absolute: 0.5 10*3/uL (ref 0.1–1.0)
Monocytes Relative: 10 %
Neutro Abs: 3.1 10*3/uL (ref 1.7–7.7)
Neutrophils Relative %: 63 %
Platelets: 233 10*3/uL (ref 150–400)
RBC: 4.03 MIL/uL (ref 3.87–5.11)
RDW: 14.3 % (ref 11.5–15.5)
WBC: 5 10*3/uL (ref 4.0–10.5)
nRBC: 0 % (ref 0.0–0.2)

## 2023-02-18 LAB — COMPREHENSIVE METABOLIC PANEL
ALT: 25 U/L (ref 0–44)
AST: 34 U/L (ref 15–41)
Albumin: 3.7 g/dL (ref 3.5–5.0)
Alkaline Phosphatase: 64 U/L (ref 38–126)
Anion gap: 5 (ref 5–15)
BUN: 14 mg/dL (ref 8–23)
CO2: 28 mmol/L (ref 22–32)
Calcium: 9.6 mg/dL (ref 8.9–10.3)
Chloride: 107 mmol/L (ref 98–111)
Creatinine, Ser: 0.83 mg/dL (ref 0.44–1.00)
GFR, Estimated: >60 mL/min (ref >60)
Glucose, Bld: 140 mg/dL — ABNORMAL HIGH (ref 70–99)
Potassium: 3.6 mmol/L (ref 3.5–5.1)
Sodium: 140 mmol/L (ref 135–145)
Total Bilirubin: 0.5 mg/dL (ref 0.0–1.2)
Total Protein: 6.9 g/dL (ref 6.5–8.1)

## 2023-02-18 LAB — MAGNESIUM: Magnesium: 2.2 mg/dL (ref 1.7–2.4)

## 2023-02-18 NOTE — ED Provider Triage Note (Signed)
Emergency Medicine Provider Triage Evaluation Note  [name] , a 78 y.o. female  was evaluated in triage.  Pt complains of spasm of the left hand, this was acute severe and her fingers drop.  It lasted for few seconds.  She has a history of aneurysm and is convinced that it could be due to a stroke despite discussion with the patient that this is very unlikely.  She had no other neurologic deficits..  Review of Systems  Positive: Hand spasm in the left side Negative: Weakness numbness vision change headache speech difficulty ataxia paresthesia confusion   Physical Exam  BP (!) 116/105 (BP Location: Right Arm)   Pulse 89   Temp 98.4 F (36.9 C)   Resp 18   Ht 5\' 6"  (1.676 m)   Wt 78.5 kg   SpO2 99%   BMI 27.92 kg/m  Gen:   Awake, no distress   Resp:  Normal effort  MSK:   Moves extremities without difficulty  Other:  No obvious neurodeficits  Medical Decision Making  Medically screening exam initiated at 8:07 PM.  Appropriate orders placed.  Abrish Chughtai was informed that the remainder of the evaluation will be completed by another provider, this initial triage assessment does not replace that evaluation, and the importance of remaining in the ED until their evaluation is complete.  States that she thinks she may have had a stroke and came here to rule it out.   Arthor Captain, PA-C 02/18/23 2010

## 2023-02-18 NOTE — ED Notes (Signed)
Pt leaving. Does not want to wait any longer. Going to call PCP in AM

## 2023-02-18 NOTE — ED Triage Notes (Signed)
  Patient came in for L hand numbness and cramping that started around 1730.  Patient states she has hx of CVA in the past and was concerned for possible stroke.  Patient states symptoms have since resolved and has no issues with hand.  Patient states she frequently has issues with muscle cramps but has never had it happen to her hand.  No pain at this time.

## 2023-02-23 DIAGNOSIS — I2510 Atherosclerotic heart disease of native coronary artery without angina pectoris: Secondary | ICD-10-CM | POA: Diagnosis not present

## 2023-02-25 DIAGNOSIS — M1712 Unilateral primary osteoarthritis, left knee: Secondary | ICD-10-CM | POA: Diagnosis not present

## 2023-03-03 DIAGNOSIS — H04123 Dry eye syndrome of bilateral lacrimal glands: Secondary | ICD-10-CM | POA: Diagnosis not present

## 2023-03-03 DIAGNOSIS — H401122 Primary open-angle glaucoma, left eye, moderate stage: Secondary | ICD-10-CM | POA: Diagnosis not present

## 2023-03-04 DIAGNOSIS — M1712 Unilateral primary osteoarthritis, left knee: Secondary | ICD-10-CM | POA: Diagnosis not present

## 2023-03-10 ENCOUNTER — Telehealth: Payer: Self-pay | Admitting: Neurology

## 2023-03-10 ENCOUNTER — Other Ambulatory Visit: Payer: Self-pay | Admitting: Neurology

## 2023-03-10 MED ORDER — CLOPIDOGREL BISULFATE 75 MG PO TABS: 75.0000 mg | ORAL_TABLET | Freq: Every day | ORAL | 0 refills | Status: DC

## 2023-03-10 NOTE — Telephone Encounter (Signed)
Refill request in Rx request. Pending to discuss with patient will call back to find if medication will be no pay.

## 2023-03-10 NOTE — Telephone Encounter (Signed)
Last seen on 03/03/22 Follow up scheduled 04/13/23   Pending to discuss with patient will call back to find if medication will be no pay at [facility].

## 2023-03-10 NOTE — Telephone Encounter (Signed)
Pt called back and asked I send 5 day supply to local pharmacy and 90 day supply to mail order.

## 2023-03-10 NOTE — Telephone Encounter (Signed)
Pt has scheduled her 1 yr f/u, she reports she is out of her clopidogrel (PLAVIX) 75 MG tablet .  [facility]  As a result of her being out of the medication, pt is asking if an amount can be called into   [facility] [phone]  until her upcoming appointment  or at least until she gets the mail order from [place] ,

## 2023-03-10 NOTE — Telephone Encounter (Signed)
Rx handled via refill request.

## 2023-03-16 DIAGNOSIS — E782 Mixed hyperlipidemia: Secondary | ICD-10-CM | POA: Diagnosis not present

## 2023-03-16 DIAGNOSIS — I10 Essential (primary) hypertension: Secondary | ICD-10-CM | POA: Diagnosis not present

## 2023-03-16 DIAGNOSIS — Z1211 Encounter for screening for malignant neoplasm of colon: Secondary | ICD-10-CM | POA: Diagnosis not present

## 2023-03-16 DIAGNOSIS — Z860100 Personal history of colon polyps, unspecified: Secondary | ICD-10-CM | POA: Diagnosis not present

## 2023-03-16 DIAGNOSIS — E119 Type 2 diabetes mellitus without complications: Secondary | ICD-10-CM | POA: Diagnosis not present

## 2023-03-22 DIAGNOSIS — Z01419 Encounter for gynecological examination (general) (routine) without abnormal findings: Secondary | ICD-10-CM | POA: Diagnosis not present

## 2023-03-23 ENCOUNTER — Encounter: Payer: Self-pay | Admitting: Neurology

## 2023-03-23 DIAGNOSIS — Z1231 Encounter for screening mammogram for malignant neoplasm of breast: Secondary | ICD-10-CM | POA: Diagnosis not present

## 2023-03-26 ENCOUNTER — Telehealth: Payer: Self-pay | Admitting: Neurology

## 2023-03-26 NOTE — Telephone Encounter (Signed)
 Pt has called and requested an appointment be cx and put on schedule for Dr Pearlean Brownie.  It was explained to pt that Doctors are wanting pt's to become established with NP's as well.  Pt has agreed to keep her 3-3 appointment and is aware to check in at 10:15 for 10:45 appointment with Shanda Bumps, NP

## 2023-03-29 ENCOUNTER — Encounter: Payer: Self-pay | Admitting: Adult Health

## 2023-03-29 ENCOUNTER — Ambulatory Visit (INDEPENDENT_AMBULATORY_CARE_PROVIDER_SITE_OTHER): Payer: Self-pay | Admitting: Adult Health

## 2023-03-29 VITALS — BP 134/59 | HR 55 | Ht 65.0 in | Wt 162.0 lb

## 2023-03-29 DIAGNOSIS — I2510 Atherosclerotic heart disease of native coronary artery without angina pectoris: Secondary | ICD-10-CM | POA: Diagnosis not present

## 2023-03-29 DIAGNOSIS — E119 Type 2 diabetes mellitus without complications: Secondary | ICD-10-CM | POA: Diagnosis not present

## 2023-03-29 DIAGNOSIS — E782 Mixed hyperlipidemia: Secondary | ICD-10-CM | POA: Diagnosis not present

## 2023-03-29 DIAGNOSIS — Z8673 Personal history of transient ischemic attack (TIA), and cerebral infarction without residual deficits: Secondary | ICD-10-CM | POA: Diagnosis not present

## 2023-03-29 DIAGNOSIS — I671 Cerebral aneurysm, nonruptured: Secondary | ICD-10-CM | POA: Diagnosis not present

## 2023-03-29 DIAGNOSIS — I10 Essential (primary) hypertension: Secondary | ICD-10-CM | POA: Diagnosis not present

## 2023-03-29 NOTE — Patient Instructions (Addendum)
 Your Plan:  Continue Plavix, Zetia and atorvastatin for secondary stroke prevention measures, ongoing refills can be obtained by your PCP  Continue to follow with your PCP for aggressive stroke risk factor management  I will follow up with Dr. Pearlean Brownie regarding time frame of repeating imaging for your cerebral aneurysms - you will be called with his recommendations      Follow-up with Dr. Pearlean Brownie in 1 year or call earlier if needed      Thank you for coming to see [place] at [facility]. I hope we have been able to provide you high quality care today.  You may receive a patient satisfaction survey over the next few weeks. We would appreciate your feedback and comments so that we may continue to improve ourselves and the health of our patients.

## 2023-03-29 NOTE — Progress Notes (Addendum)
 [facility] [address] [place]. [place] [address] [phone]       HOSPITAL FOLLOW UP NOTE  [name] Date of Birth:  1946-01-04 Medical Record Number:  [id number]   Reason for visit: h/o stroke, cerebral aneurysm    SUBJECTIVE:   CHIEF COMPLAINT:  Chief Complaint  Patient presents with   Follow-up    Pt in room 8. Alone. Here for stroke follow up. Pt still taking Plavix, pt reports doing well.    HPI:   Update 03/29/2023 JM: Patient returns for yearly follow-up unaccompanied.  Has been stable from a stroke standpoint without any new stroke/TIA symptoms.  Remains on Plavix, atorvastatin and Zetia without side effects.  Routinely follows with PCP for stroke risk factor management. Reports recent lab work with PCP showed improvement of LDL (unable to view via epic).   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  She was seen in the ED in 01/2023 with complaints of spasms in her left hand that resolved upon presenting to ED, she was concerned possibly due to stroke despite being advised it was unlikely, no other neurological deficits and neuroexam intact.  CT head negative, lab work benign.  She left prior to further evaluation/recommendations.  She has not had any reoccurrence.  She is scheduled to undergo colonoscopy on 4/11 by Dr. Loreta Ave at [facility]. She will need clearance to hold Plavix for 5 days prior to procedure.      History provided for reference purposes only Update 03/03/2022 Dr. Pearlean Brownie: She returns for follow-up after last visit a year ago.  He has been doing well without any No several years.  Remains quite active and exercises regularly.  He is a little upset as she is almost out of her Plavix.  Her refill for plavix  90-day supply.has not yet gone  to [facility] so she will be out of it and is requesting a  10 day supply prescription from her  local pharmacy .  She remains on Plavix which is tolerating well without bruising or bleeding.  Her blood pressure is usually under good control with systolic in the 130-140 range.  She remains on Lipitor which she is tolerating well without muscle aches and pains her last lipid profile was not satisfactory however I am unable to find any   recent  labs when I look in the computer.  He has no new complaints.  Her last cerebral CT angiogram was more than 2 years ago she is due for repeat 1 for aneurysm surveillance  03/04/2021 Dr. Pearlean Brownie: She returns for follow-up after last visit a year ago.  She continues to do well and has not had recurrent TIA or stroke symptoms now for several years.  She is pretty active and exercises regularly.  She had colonoscopy done last year which showed no significant polyps.  She remains on Clopidogrel which is tolerating well without bleeding or bruising.  Blood pressure is normally well controlled at home though today it is elevated in the office and she admits she had missed taking her morning dose of blood pressure medicine.  She states her sugars are under very good control.  She remains on Lipitor which is tolerating well without muscle aches and pains.  She does see primary physician regularly and will have follow-up lipid profile and A1c checked.  She has no complaints today.  Update 03/04/2020 Dr. Pearlean Brownie; she returns for follow-up after last visit in November 2020.  She continues to do well and has not had recurrent TIA or stroke symptoms.  She remains on Plavix which is tolerating well without bruising or bleeding.  She remains on Lipitor which is tolerating well as well without muscle aches and pains.  She had lipid profile checked 3 months ago her primary physician and her satisfactory.  Her blood pressure is well controlled at home usually but today it is elevated in office slightly at 160/72.  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  She has no new complaints today   Update 12/13/2018 Dr. Pearlean Brownie: She returns for follow-up after last visit 9 months ago.  She states she is doing well she has had no further recurrent stroke or TIA symptoms.  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  She still has some intermittent anxiety and tremulousness and she gets nervous.  Her blood pressure tends to fluctuate but when she is relaxed it is normal.  Today it is slightly elevated in the office at 152/86.  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  She states she was had a chronic reflux symptoms over the last few months and was tested for Covid multiple times.  She was positive initially then next time indeterminate and then positive again.  She isolated herself a few weeks.  Subsequently she is been tested twice and has stayed negative.  Interestingly she had multiple family members were also tested positive in the time but nobody had any symptoms of Covid infection and were not sick requiring any hospitalization.  She remains on Plavix which is tolerating well without bruising or bleeding.  She has no new complaints.  Her last CT angiogram of the brain to follow-up on aneurysms was a year ago.   Update 03/22/2018 Dr. Pearlean Brownie: She returns for follow-up after last visit 3 months ago.  She was hospitalized for a small thalamic lacunar infarct in December 2019.  She complained of some numbness and incoordination of her hand as well as felt jittery and tremulous.  The symptoms are similar to her previous stroke in 2007 and she went to the hospital.  MRI scan of the brain was obtained which showed a tiny acute lacunar infarct in the left thalamus.  Carotid ultrasound was unremarkable.  She had recently had CT angiogram of the brain in November 2019 for follow-up  of her asymptomatic left cavernous and pericallosal aneurysm since it was not repeated.  Echocardiogram showed normal ejection fraction.  LDL cholesterol was 95 mg percent.  Hemoglobin A1c was 5.7.  Patient had been on Aggrenox following her previous stroke since 2007.  This was changed to aspirin and Plavix.  She was advised to stop aspirin after 3 weeks was Ms. misunderstood this and is currently on both.  She is tolerating them well without bruising or bleeding.  She states her numbness and clumsiness has resolved completely.  She has no focal deficits.  She is on Lipitor now 40 mg daily.  She has not had any follow-up lipid profile checked.  She has been keeping track of her blood pressure and is worried that her morning systolic ranges in the 140s range.  She has an upcoming visit with her primary care  physician Dr. Norma Fredrickson and plans to discuss this.  She is also noticed some lumps beneath her right jaw which are probably enlarged lymph nodes.  She denies any significant tooth pain, recurrent sinus infections.   Update 05/05/2016 Dr. Pearlean Brownie: She returns for follow-up after last visit 6 months ago. She continues to do well without recurrent stroke or TIA symptoms. She had follow-up CT angiogram of the brain and neck done on 12/02/15 which showed stable appearance of her intracranial aneurysms. Patient states his started walking regularly and has lost 20 pounds. She occasionally hears her heartbeat in the ears particularly at night when she is lying down quietly. She denies significant stress. She is also noticed some intermittent numbness in the lower lateral aspect of the left foot. She does agree that she sleeps on her left side a lot. She denies significant back pain or radicular pain. She has not had any recurrent stroke or TIA symptoms. She states her blood pressure is well controlled today it is 120/63   Update 11/21/2015 Dr. Pearlean Brownie: She returns for follow-up after last visit with Dr. showed 2 months ago. She  states she's not had any further episodes of numbness on cheek. She feels muscle tightness in the back of her head and neck but denies significant radicular pain. She did have an episode of her right hand fingers locking up after and morning when she was taking taken pending her wrist a lot in the kitchen. She did not undergo MRI scan the brain and cervical spine has been never got a call to schedule that but have now been scheduled for 12/02/15. Similarly CT angiogram of the brain for follow-up of aneurysms is also scheduled for the same day. Patient was seen by gastroenterologist Dr. Loreta Ave today for rectal bleeding and is scheduled to undergo colonoscopy in December. She is concerned about risk of stroke and stopping Aggrenox for 5 days prior to the procedure.     ROS:   14 system review of systems performed and negative with exception of those listed in HPI  PMH:  Past Medical History:  Diagnosis Date   Anemia    Cystic breast    Cystic breast    History of chicken pox    History of measles    History of mumps    Hypertension    Stroke (HCC)    TIA   Trichomonas    Trichomonas    Yeast infection     PSH:  Past Surgical History:  Procedure Laterality Date   DILATION AND CURETTAGE OF UTERUS     excision of fibroadenoma      Social History:  Social History   Socioeconomic History   Marital status: Widowed    Spouse name: Not on file   Number of children: 2   Years of education: masters   Highest education level: Not on file  Occupational History   Occupation: retired  Tobacco Use   Smoking status: Never   Smokeless tobacco: Never  Vaping Use   Vaping status: Never Used  Substance and Sexual Activity   Alcohol use: No    Alcohol/week: 0.0 standard drinks of alcohol   Drug use: No   Sexual activity: Not Currently    Birth control/protection: Post-menopausal, Abstinence  Other Topics Concern   Not on file  Social History Narrative   Lives alone   Right handed    Drinks caffeine occasionally 1 or 2x week   Social Drivers of Corporate investment banker Strain:  Not on file  Food Insecurity: Not on file  Transportation Needs: Not on file  Physical Activity: Not on file  Stress: Not on file  Social Connections: Not on file  Intimate Partner Violence: Not on file    Family History:  Family History  Problem Relation Age of Onset   Cancer Mother    Cancer Father    Deep vein thrombosis Sister    Diabetes Sister    Hyperlipidemia Sister    Diabetes Daughter    Hyperlipidemia Daughter     Medications:   Current Outpatient Medications on File Prior to Visit  Medication Sig Dispense Refill   acetaminophen (TYLENOL) 500 MG tablet Take 500 mg by mouth every 6 (six) hours as needed for mild pain or moderate pain.      ALPRAZolam (XANAX) 1 MG tablet Take 1 mg by mouth 2 (two) times daily as needed for anxiety.      atorvastatin (LIPITOR) 40 MG tablet Take 1 tablet (40 mg total) by mouth daily at 6 PM. 30 tablet 0   betamethasone dipropionate 0.05 % cream as needed.     Blood Glucose Monitoring Suppl (TRUE METRIX METER) w/Device KIT as needed.     brimonidine (ALPHAGAN) 0.15 % ophthalmic solution Place 1 drop into both eyes 2 (two) times daily.      clopidogrel (PLAVIX) 75 MG tablet TAKE 1 TABLET EVERY DAY 90 tablet 0   ezetimibe (ZETIA) 10 MG tablet Take 10 mg by mouth at bedtime.      latanoprost (XALATAN) 0.005 % ophthalmic solution Place 1 drop into both eyes at bedtime.     metoprolol succinate (TOPROL-XL) 50 MG 24 hr tablet at bedtime.     TRUE METRIX BLOOD GLUCOSE TEST test strip as needed.     valsartan-hydrochlorothiazide (DIOVAN-HCT) 160-12.5 MG tablet daily.     clopidogrel (PLAVIX) 75 MG tablet Take 1 tablet (75 mg total) by mouth daily. (Patient not taking: Reported on 03/29/2023) 5 tablet 0   senna-docusate (SENOKOT-S) 8.6-50 MG tablet Take 1 tablet by mouth at bedtime as needed for mild constipation. (Patient not taking: Reported on  03/29/2023) 30 tablet 0   No current facility-administered medications on file prior to visit.    Allergies:   Allergies  Allergen Reactions   Flagyl [Metronidazole] Itching and Swelling      OBJECTIVE:  Physical Exam  Vitals:   03/29/23 1024  BP: (!) 134/59  Pulse: (!) 55  Weight: 162 lb (73.5 kg)  Height: 5\' 5"  (1.651 m)   Body mass index is 26.96 kg/m. No results found.  General: well developed, well nourished, seated, in no evident distress  Neurologic Exam Mental Status: Awake and fully alert.  Fluent speech and language.  Oriented to place and time. Recent and remote memory intact. Attention span, concentration and fund of knowledge appropriate. Mood and affect appropriate.  Cranial Nerves: Pupils equal, briskly reactive to light. Extraocular movements full without nystagmus. Visual fields full to confrontation. Hearing intact. Facial sensation intact. Face, tongue, palate moves normally and symmetrically.  Motor: Normal bulk and tone. Normal strength in all tested extremity muscles Sensory.: intact to touch , pinprick , position and vibratory sensation.  Coordination: Rapid alternating movements normal in all extremities. Finger-to-nose and heel-to-shin performed accurately bilaterally. Gait and Station: Arises from chair without difficulty. Stance is normal. Gait demonstrates normal stride length and balance without use of AD Reflexes: 1+ and symmetric. Toes downgoing.         ASSESSMENT: [name]  is a 78 y.o. 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  She also has asymptomatic cerebral aneurysms which have remained stable on follow-up imaging since 2007.     PLAN:  Asymptomatic cerebral aneurysm Discussed repeat CTA in 2027 (3 yr duration) but patient insistent this be completed annually, discussed prior imaging was completed 3 years apart but she disagrees with this. She is requesting  this be discussed with Dr. Pearlean Brownie, will get his input and patient will be called regarding Dr. Marlis Edelson recommendations.  ADDENDUM 04/06/2023 JM: Per Dr. Pearlean Brownie, no evidence that earlier imaging is of any benefit and that there is a small risk of radiation if she gets too many CT angios.  Patient was called regarding this recommendation and voice mail left (with patients consent), she was encouraged to call back with any questions or concerns. 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   Discussed secondary stroke prevention measures and importance of close PCP follow up for aggressive stroke risk factor management including BP goal<130/90, HLD with LDL goal<70 and DM with A1c.<7     Follow up in 1 year with Dr. Pearlean Brownie (per patient request) or call earlier if needed   CC:  [facility] provider: Dr. Pearlean Brownie PCP: Merri Brunette, MD     I spent 30 minutes of face-to-face and non-face-to-face time with patient.  This included previsit chart review, lab review, study review, order entry, electronic health record documentation, patient education and discussion regarding above diagnoses and treatment plan and answered all other questions to patient's satisfaction  Ihor Austin, [facility]  [facility] [address] [place], [place] [address]  Phone [phone] Fax [phone] Note: This document was prepared with digital dictation and possible smart phrase technology. Any transcriptional errors that result from  this process are unintentional.

## 2023-04-01 DIAGNOSIS — I10 Essential (primary) hypertension: Secondary | ICD-10-CM | POA: Diagnosis not present

## 2023-04-01 DIAGNOSIS — E1169 Type 2 diabetes mellitus with other specified complication: Secondary | ICD-10-CM | POA: Diagnosis not present

## 2023-04-01 DIAGNOSIS — E7800 Pure hypercholesterolemia, unspecified: Secondary | ICD-10-CM | POA: Diagnosis not present

## 2023-04-01 DIAGNOSIS — I2510 Atherosclerotic heart disease of native coronary artery without angina pectoris: Secondary | ICD-10-CM | POA: Diagnosis not present

## 2023-04-01 DIAGNOSIS — Z8673 Personal history of transient ischemic attack (TIA), and cerebral infarction without residual deficits: Secondary | ICD-10-CM | POA: Diagnosis not present

## 2023-04-13 ENCOUNTER — Ambulatory Visit: Payer: Medicare Other | Admitting: Adult Health

## 2023-04-29 ENCOUNTER — Ambulatory Visit: Payer: Medicare Other | Admitting: Podiatry

## 2023-04-29 ENCOUNTER — Encounter: Payer: Self-pay | Admitting: Podiatry

## 2023-04-29 DIAGNOSIS — B351 Tinea unguium: Secondary | ICD-10-CM | POA: Diagnosis not present

## 2023-04-29 DIAGNOSIS — M79674 Pain in right toe(s): Secondary | ICD-10-CM | POA: Diagnosis not present

## 2023-04-29 DIAGNOSIS — M79675 Pain in left toe(s): Secondary | ICD-10-CM | POA: Diagnosis not present

## 2023-04-29 DIAGNOSIS — Z7901 Long term (current) use of anticoagulants: Secondary | ICD-10-CM

## 2023-04-29 NOTE — Progress Notes (Signed)
  Subjective:   Patient ID: [name], female   DOB: 78 y.o.   MRN: [id number]   HPI Chief Complaint  Patient presents with   RFC    RM# RFC     78 year old female presents the office with above concerns.  She says her nails are thick and discolored this does not really look.  They do cause discomfort.  Her right big toenail or gets ingrown.  She is asking if I can help reshape the toenails.  She denies any swelling or redness or any drainage.    She is on Plavix.   Review of Systems  All other systems reviewed and are negative.    Objective:  Physical Exam  General: AAO x3, NAD  Dermatological: Ulcer hypertrophic, dysarthria, brown discoloration.  There is no edema, erythema or signs of infection.  Incurvation present to nail border without any signs of infection.  No open lesions.  Vascular: Dorsalis Pedis artery and Posterior Tibial artery pedal pulses are 2/4 bilateral with immedate capillary fill time. There is no pain with calf compression, swelling, warmth, erythema.   Neruologic: Grossly intact via light touch bilateral.  Musculoskeletal: Tenderness to the toenails.  No other areas of discomfort.      Assessment:   Symptomatic onychomycosis     Plan:  -Treatment options discussed including all alternatives, risks, and complications -Etiology of symptoms were discussed -Sharp debrided nails x 10 without any complications or bleeding. -Discussed culturing the nails but we discussed topical medications.  Discussed over-the-counter Fungi-Nail but also urea nail gel. -Discussed partial nail avulsion to the ingrown toenail but she would like to hold off on this.  Monitoring signs or symptoms of infection.  Return in about 3 months (around 07/29/2023), or if symptoms worsen or fail to improve.  Vivi Barrack DPM

## 2023-04-29 NOTE — Patient Instructions (Signed)
 For other the counter medications I like FUNGI-NAIL and UREA NAIL GEL

## 2023-05-07 DIAGNOSIS — Z860101 Personal history of adenomatous and serrated colon polyps: Secondary | ICD-10-CM | POA: Diagnosis not present

## 2023-05-07 DIAGNOSIS — Z1211 Encounter for screening for malignant neoplasm of colon: Secondary | ICD-10-CM | POA: Diagnosis not present

## 2023-05-07 DIAGNOSIS — K5730 Diverticulosis of large intestine without perforation or abscess without bleeding: Secondary | ICD-10-CM | POA: Diagnosis not present

## 2023-05-22 ENCOUNTER — Other Ambulatory Visit: Payer: Self-pay | Admitting: Neurology

## 2023-05-24 NOTE — Telephone Encounter (Signed)
 Last seen on 03/31/23 per note " Continue Plavix  and atorvastatin  (Lipitor) for secondary stroke prevention managed/prescribed by PCP. " Follow up scheduled on 03/30/24

## 2023-06-28 DIAGNOSIS — E119 Type 2 diabetes mellitus without complications: Secondary | ICD-10-CM | POA: Diagnosis not present

## 2023-06-28 DIAGNOSIS — I10 Essential (primary) hypertension: Secondary | ICD-10-CM | POA: Diagnosis not present

## 2023-06-28 DIAGNOSIS — I2510 Atherosclerotic heart disease of native coronary artery without angina pectoris: Secondary | ICD-10-CM | POA: Diagnosis not present

## 2023-06-28 DIAGNOSIS — E782 Mixed hyperlipidemia: Secondary | ICD-10-CM | POA: Diagnosis not present

## 2023-07-01 DIAGNOSIS — E1149 Type 2 diabetes mellitus with other diabetic neurological complication: Secondary | ICD-10-CM | POA: Diagnosis not present

## 2023-07-01 DIAGNOSIS — E7800 Pure hypercholesterolemia, unspecified: Secondary | ICD-10-CM | POA: Diagnosis not present

## 2023-07-01 DIAGNOSIS — Z8673 Personal history of transient ischemic attack (TIA), and cerebral infarction without residual deficits: Secondary | ICD-10-CM | POA: Diagnosis not present

## 2023-07-01 DIAGNOSIS — I2510 Atherosclerotic heart disease of native coronary artery without angina pectoris: Secondary | ICD-10-CM | POA: Diagnosis not present

## 2023-07-01 DIAGNOSIS — I10 Essential (primary) hypertension: Secondary | ICD-10-CM | POA: Diagnosis not present

## 2023-07-01 DIAGNOSIS — L659 Nonscarring hair loss, unspecified: Secondary | ICD-10-CM | POA: Diagnosis not present

## 2023-07-01 DIAGNOSIS — E1169 Type 2 diabetes mellitus with other specified complication: Secondary | ICD-10-CM | POA: Diagnosis not present

## 2023-07-14 DIAGNOSIS — H04123 Dry eye syndrome of bilateral lacrimal glands: Secondary | ICD-10-CM | POA: Diagnosis not present

## 2023-07-14 DIAGNOSIS — H401122 Primary open-angle glaucoma, left eye, moderate stage: Secondary | ICD-10-CM | POA: Diagnosis not present

## 2023-07-28 ENCOUNTER — Telehealth: Payer: Self-pay | Admitting: *Deleted

## 2023-07-28 NOTE — Telephone Encounter (Signed)
 Pt called, need to know what year did Dr. Rosemarie changed her prescription  to Clopidogrel  75 mg she is having hair loss. Please call [phone]

## 2023-07-28 NOTE — Telephone Encounter (Signed)
 Cld Pt regarding medication questions. Pt asking if Dr. Rosemarie will consider changing her from clopidogrel  75 mg back to aspirin /dipyridamole  200-25 mg because since being on clopidogrel  Pt reports she has had hair breakage and losing hair. Pt stated she has had thyroid  function checked and it is normal and she does not receive any hair services involving chemicals. Pt is trying the process of elimination to determine what is causing the hair breakage and has traced it back to when she was changed to clopidogrel  in 2019. Pt voiced understanding if Dr. Rosemarie is not in agreement but she wanted to ask. Informed Pt will inquire w/provider to see if he will consider. Pt voiced understanding and thanks for the call back.

## 2023-08-03 DIAGNOSIS — M1712 Unilateral primary osteoarthritis, left knee: Secondary | ICD-10-CM | POA: Diagnosis not present

## 2023-08-19 DIAGNOSIS — M25561 Pain in right knee: Secondary | ICD-10-CM | POA: Diagnosis not present

## 2023-09-02 DIAGNOSIS — M1711 Unilateral primary osteoarthritis, right knee: Secondary | ICD-10-CM | POA: Diagnosis not present

## 2023-09-09 DIAGNOSIS — M1711 Unilateral primary osteoarthritis, right knee: Secondary | ICD-10-CM | POA: Diagnosis not present

## 2023-09-16 DIAGNOSIS — M25561 Pain in right knee: Secondary | ICD-10-CM | POA: Diagnosis not present

## 2023-09-16 DIAGNOSIS — M1711 Unilateral primary osteoarthritis, right knee: Secondary | ICD-10-CM | POA: Diagnosis not present

## 2023-09-17 DIAGNOSIS — I10 Essential (primary) hypertension: Secondary | ICD-10-CM | POA: Diagnosis not present

## 2023-09-21 ENCOUNTER — Other Ambulatory Visit (HOSPITAL_COMMUNITY): Payer: Self-pay

## 2023-09-21 DIAGNOSIS — I739 Peripheral vascular disease, unspecified: Secondary | ICD-10-CM

## 2023-09-22 ENCOUNTER — Ambulatory Visit (HOSPITAL_COMMUNITY)
Admission: RE | Admit: 2023-09-22 | Discharge: 2023-09-22 | Disposition: A | Discharge status: Home / Self Care | Source: Ambulatory Visit | Attending: Vascular Surgery | Admitting: Vascular Surgery

## 2023-09-22 DIAGNOSIS — I671 Cerebral aneurysm, nonruptured: Secondary | ICD-10-CM | POA: Diagnosis not present

## 2023-09-22 DIAGNOSIS — E1142 Type 2 diabetes mellitus with diabetic polyneuropathy: Secondary | ICD-10-CM | POA: Diagnosis not present

## 2023-09-22 DIAGNOSIS — I2510 Atherosclerotic heart disease of native coronary artery without angina pectoris: Secondary | ICD-10-CM | POA: Diagnosis not present

## 2023-09-22 DIAGNOSIS — Z0000 Encounter for general adult medical examination without abnormal findings: Secondary | ICD-10-CM | POA: Diagnosis not present

## 2023-09-22 DIAGNOSIS — I739 Peripheral vascular disease, unspecified: Secondary | ICD-10-CM | POA: Insufficient documentation

## 2023-09-22 DIAGNOSIS — E559 Vitamin D deficiency, unspecified: Secondary | ICD-10-CM | POA: Diagnosis not present

## 2023-09-22 DIAGNOSIS — F5104 Psychophysiologic insomnia: Secondary | ICD-10-CM | POA: Diagnosis not present

## 2023-09-22 DIAGNOSIS — I10 Essential (primary) hypertension: Secondary | ICD-10-CM | POA: Diagnosis not present

## 2023-09-22 DIAGNOSIS — Z8673 Personal history of transient ischemic attack (TIA), and cerebral infarction without residual deficits: Secondary | ICD-10-CM | POA: Diagnosis not present

## 2023-09-22 DIAGNOSIS — I700 Atherosclerosis of aorta: Secondary | ICD-10-CM | POA: Diagnosis not present

## 2023-09-22 LAB — VAS US ABI WITH/WO TBI
Left ABI: 1.27
Right ABI: 1.16

## 2023-10-04 DIAGNOSIS — E782 Mixed hyperlipidemia: Secondary | ICD-10-CM | POA: Diagnosis not present

## 2023-10-04 DIAGNOSIS — E119 Type 2 diabetes mellitus without complications: Secondary | ICD-10-CM | POA: Diagnosis not present

## 2023-10-04 DIAGNOSIS — I2510 Atherosclerotic heart disease of native coronary artery without angina pectoris: Secondary | ICD-10-CM | POA: Diagnosis not present

## 2023-10-04 DIAGNOSIS — I10 Essential (primary) hypertension: Secondary | ICD-10-CM | POA: Diagnosis not present

## 2023-10-06 NOTE — Progress Notes (Unsigned)
 Patient ID: Azha Constantin, female   DOB: [birth year], 78 y.o.   MRN: [id number]  Reason for Consult: No chief complaint on file.   Referred by Kay Kemps, MD  Subjective:     HPI Crystalle Popwell is a 78 y.o. female presenting for evaluation of leg pain.  She has been getting knee injections in her right knee but is continue to have pain. ***  Past Medical History:  Diagnosis Date   Anemia    Cystic breast    Cystic breast    History of chicken pox    History of measles    History of mumps    Hypertension    Stroke (HCC)    TIA   Trichomonas    Trichomonas    Yeast infection    Family History  Problem Relation Age of Onset   Cancer Mother    Cancer Father    Deep vein thrombosis Sister    Diabetes Sister    Hyperlipidemia Sister    Diabetes Daughter    Hyperlipidemia Daughter    Past Surgical History:  Procedure Laterality Date   DILATION AND CURETTAGE OF UTERUS     excision of fibroadenoma      Short Social History:  Social History   Tobacco Use   Smoking status: Never   Smokeless tobacco: Never  Substance Use Topics   Alcohol use: No    Alcohol/week: 0.0 standard drinks of alcohol    Allergies  Allergen Reactions   Flagyl [Metronidazole] Itching and Swelling    Current Outpatient Medications  Medication Sig Dispense Refill   acetaminophen  (TYLENOL ) 500 MG tablet Take 500 mg by mouth every 6 (six) hours as needed for mild pain or moderate pain.      ALPRAZolam  (XANAX ) 1 MG tablet Take 1 mg by mouth 2 (two) times daily as needed for anxiety.      atorvastatin  (LIPITOR) 40 MG tablet Take 1 tablet (40 mg total) by mouth daily at 6 PM. 30 tablet 0   betamethasone dipropionate 0.05 % cream as needed.     Blood Glucose Monitoring Suppl (TRUE METRIX METER) w/Device KIT as needed.     brimonidine  (ALPHAGAN ) 0.15 % ophthalmic solution Place 1 drop into both eyes 2 (two) times daily.      clopidogrel  (PLAVIX ) 75 MG tablet TAKE 1 TABLET EVERY  DAY 90 tablet 3   ezetimibe  (ZETIA ) 10 MG tablet Take 10 mg by mouth at bedtime.      latanoprost  (XALATAN ) 0.005 % ophthalmic solution Place 1 drop into both eyes at bedtime.     metoprolol  succinate (TOPROL -XL) 50 MG 24 hr tablet at bedtime.     TRUE METRIX BLOOD GLUCOSE TEST test strip as needed.     valsartan-hydrochlorothiazide (DIOVAN-HCT) 160-12.5 MG tablet daily.     No current facility-administered medications for this visit.    REVIEW OF SYSTEMS  All other systems were reviewed and are negative     Objective:  Objective   There were no vitals filed for this visit. There is no height or weight on file to calculate BMI.  Physical Exam General: no acute distress Cardiac: hemodynamically stable Pulm: normal work of breathing Abdomen: non-tender, no pulsatile mass*** Neuro: alert, no focal deficit Extremities: no edema, cyanosis or wounds*** Vascular:   Right: ***  Left: ***  Data: ABI ABI Findings:  +---------+------------------+-----+---------+--------+  Right   Rt Pressure (mmHg)IndexWaveform Comment   +---------+------------------+-----+---------+--------+  Brachial 152                                       +---------+------------------+-----+---------+--------+  PTA     177               1.16 triphasic          +---------+------------------+-----+---------+--------+  DP      178               1.16 biphasic           +---------+------------------+-----+---------+--------+  [place] La               0.90 Normal             +---------+------------------+-----+---------+--------+   +---------+------------------+-----+---------+-------+  Left    Lt Pressure (mmHg)IndexWaveform Comment  +---------+------------------+-----+---------+-------+  Brachial 153                                      +---------+------------------+-----+---------+-------+  PTA     179               1.17 triphasic          +---------+------------------+-----+---------+-------+  DP      195               1.27 triphasic         +---------+------------------+-----+---------+-------+  [place] Boyers               0.73 Normal            +---------+------------------+-----+---------+-------+   +-------+-----------+-----------+------------+------------+  ABI/TBIToday's ABIToday's TBIPrevious ABIPrevious TBI  +-------+-----------+-----------+------------+------------+  Right 1.16       0.90                                 +-------+-----------+-----------+------------+------------+  Left  1.27       0.73                                 +-------+-----------+-----------+------------+------------+       Assessment/Plan:   Raya Mckinstry is a 78 y.o. female with ***  Recommendations to optimize cardiovascular risk: Abstinence from all tobacco products. Blood glucose control with goal A1c < 7%. Blood pressure control with goal blood pressure < 140/90 mmHg. Lipid reduction therapy with goal LDL-C <100 mg/dL  Aspirin  81mg  PO QD.  Atorvastatin  40-80mg  PO QD (or other high intensity statin therapy).   Norman GORMAN Serve MD [facility]

## 2023-10-08 ENCOUNTER — Ambulatory Visit: Attending: Vascular Surgery | Admitting: Vascular Surgery

## 2023-10-08 ENCOUNTER — Encounter: Payer: Self-pay | Admitting: Vascular Surgery

## 2023-10-08 VITALS — BP 158/76 | HR 62 | Temp 98.5°F | Ht 65.0 in | Wt 164.0 lb

## 2023-10-08 DIAGNOSIS — G629 Polyneuropathy, unspecified: Secondary | ICD-10-CM | POA: Diagnosis not present

## 2023-11-10 DIAGNOSIS — E119 Type 2 diabetes mellitus without complications: Secondary | ICD-10-CM | POA: Diagnosis not present

## 2023-11-10 DIAGNOSIS — H43813 Vitreous degeneration, bilateral: Secondary | ICD-10-CM | POA: Diagnosis not present

## 2023-11-10 DIAGNOSIS — H04123 Dry eye syndrome of bilateral lacrimal glands: Secondary | ICD-10-CM | POA: Diagnosis not present

## 2023-11-10 DIAGNOSIS — H401132 Primary open-angle glaucoma, bilateral, moderate stage: Secondary | ICD-10-CM | POA: Diagnosis not present

## 2023-11-26 DIAGNOSIS — M1990 Unspecified osteoarthritis, unspecified site: Secondary | ICD-10-CM | POA: Diagnosis not present

## 2023-11-26 DIAGNOSIS — Z7902 Long term (current) use of antithrombotics/antiplatelets: Secondary | ICD-10-CM | POA: Diagnosis not present

## 2023-11-26 DIAGNOSIS — I129 Hypertensive chronic kidney disease with stage 1 through stage 4 chronic kidney disease, or unspecified chronic kidney disease: Secondary | ICD-10-CM | POA: Diagnosis not present

## 2023-11-26 DIAGNOSIS — G629 Polyneuropathy, unspecified: Secondary | ICD-10-CM | POA: Diagnosis not present

## 2023-11-26 DIAGNOSIS — Z833 Family history of diabetes mellitus: Secondary | ICD-10-CM | POA: Diagnosis not present

## 2023-11-26 DIAGNOSIS — I700 Atherosclerosis of aorta: Secondary | ICD-10-CM | POA: Diagnosis not present

## 2023-11-26 DIAGNOSIS — E785 Hyperlipidemia, unspecified: Secondary | ICD-10-CM | POA: Diagnosis not present

## 2023-11-26 DIAGNOSIS — H409 Unspecified glaucoma: Secondary | ICD-10-CM | POA: Diagnosis not present

## 2023-11-26 DIAGNOSIS — M8580 Other specified disorders of bone density and structure, unspecified site: Secondary | ICD-10-CM | POA: Diagnosis not present

## 2023-11-26 DIAGNOSIS — N182 Chronic kidney disease, stage 2 (mild): Secondary | ICD-10-CM | POA: Diagnosis not present

## 2023-11-26 DIAGNOSIS — I499 Cardiac arrhythmia, unspecified: Secondary | ICD-10-CM | POA: Diagnosis not present

## 2023-11-26 DIAGNOSIS — Z8249 Family history of ischemic heart disease and other diseases of the circulatory system: Secondary | ICD-10-CM | POA: Diagnosis not present

## 2023-11-26 DIAGNOSIS — Z881 Allergy status to other antibiotic agents status: Secondary | ICD-10-CM | POA: Diagnosis not present

## 2023-12-07 DIAGNOSIS — E1169 Type 2 diabetes mellitus with other specified complication: Secondary | ICD-10-CM | POA: Diagnosis not present

## 2023-12-07 DIAGNOSIS — Z7901 Long term (current) use of anticoagulants: Secondary | ICD-10-CM | POA: Diagnosis not present

## 2023-12-07 DIAGNOSIS — I10 Essential (primary) hypertension: Secondary | ICD-10-CM | POA: Diagnosis not present

## 2023-12-07 DIAGNOSIS — Z8673 Personal history of transient ischemic attack (TIA), and cerebral infarction without residual deficits: Secondary | ICD-10-CM | POA: Diagnosis not present

## 2023-12-07 DIAGNOSIS — E7800 Pure hypercholesterolemia, unspecified: Secondary | ICD-10-CM | POA: Diagnosis not present

## 2023-12-15 DIAGNOSIS — H401132 Primary open-angle glaucoma, bilateral, moderate stage: Secondary | ICD-10-CM | POA: Diagnosis not present

## 2024-03-30 ENCOUNTER — Ambulatory Visit: Admitting: Neurology
